# Patient Record
Sex: Male | Born: 1935
Health system: Southern US, Community
[De-identification: ages and names within clinical notes are randomized; demographics above are authoritative.]

## PROBLEM LIST (undated history)

## (undated) DIAGNOSIS — K219 Gastro-esophageal reflux disease without esophagitis: Secondary | ICD-10-CM

## (undated) DIAGNOSIS — K2270 Barrett's esophagus without dysplasia: Secondary | ICD-10-CM

## (undated) DIAGNOSIS — I2510 Atherosclerotic heart disease of native coronary artery without angina pectoris: Secondary | ICD-10-CM

## (undated) DIAGNOSIS — I219 Acute myocardial infarction, unspecified: Secondary | ICD-10-CM

## (undated) DIAGNOSIS — R7309 Other abnormal glucose: Secondary | ICD-10-CM

## (undated) DIAGNOSIS — E785 Hyperlipidemia, unspecified: Secondary | ICD-10-CM

## (undated) DIAGNOSIS — R17 Unspecified jaundice: Secondary | ICD-10-CM

## (undated) DIAGNOSIS — I10 Essential (primary) hypertension: Secondary | ICD-10-CM

## (undated) DIAGNOSIS — J189 Pneumonia, unspecified organism: Secondary | ICD-10-CM

## (undated) DIAGNOSIS — K635 Polyp of colon: Secondary | ICD-10-CM

## (undated) HISTORY — DX: Barrett's esophagus without dysplasia: K22.70

## (undated) HISTORY — DX: Polyp of colon: K63.5

## (undated) HISTORY — DX: Gastro-esophageal reflux disease without esophagitis: K21.9

## (undated) HISTORY — DX: Unspecified jaundice: R17

## (undated) HISTORY — DX: Atherosclerotic heart disease of native coronary artery without angina pectoris: I25.10

## (undated) HISTORY — DX: Acute myocardial infarction, unspecified: I21.9

## (undated) HISTORY — DX: Essential (primary) hypertension: I10

## (undated) HISTORY — DX: Pneumonia, unspecified organism: J18.9

## (undated) HISTORY — DX: Hyperlipidemia, unspecified: E78.5

## (undated) HISTORY — DX: Other abnormal glucose: R73.09

## (undated) HISTORY — PX: OTHER SURGICAL HISTORY: SHX169

---

## 1987-09-09 HISTORY — PX: CORONARY ARTERY BYPASS GRAFT: SHX141

## 1999-09-09 HISTORY — PX: CORONARY ANGIOPLASTY: SHX604

## 2000-03-26 ENCOUNTER — Ambulatory Visit (HOSPITAL_COMMUNITY): Admission: RE | Admit: 2000-03-26 | Discharge: 2000-03-27 | Payer: Self-pay | Admitting: *Deleted

## 2000-03-26 ENCOUNTER — Encounter: Payer: Self-pay | Admitting: *Deleted

## 2001-09-08 HISTORY — PX: CHOLECYSTECTOMY: SHX55

## 2001-12-07 HISTORY — PX: OTHER SURGICAL HISTORY: SHX169

## 2003-04-09 DIAGNOSIS — K635 Polyp of colon: Secondary | ICD-10-CM

## 2003-04-09 HISTORY — DX: Polyp of colon: K63.5

## 2003-05-09 ENCOUNTER — Encounter: Payer: Self-pay | Admitting: Gastroenterology

## 2003-05-09 ENCOUNTER — Encounter: Payer: Self-pay | Admitting: Family Medicine

## 2003-05-09 HISTORY — PX: ESOPHAGOGASTRODUODENOSCOPY: SHX1529

## 2003-05-09 HISTORY — PX: COLONOSCOPY W/ POLYPECTOMY: SHX1380

## 2003-07-10 ENCOUNTER — Encounter: Payer: Self-pay | Admitting: Family Medicine

## 2003-07-10 LAB — CONVERTED CEMR LAB: PSA: 0.36 ng/mL

## 2004-06-08 ENCOUNTER — Encounter: Payer: Self-pay | Admitting: Family Medicine

## 2004-06-08 LAB — CONVERTED CEMR LAB: PSA: 0.4 ng/mL

## 2004-07-29 ENCOUNTER — Ambulatory Visit: Payer: Self-pay | Admitting: Family Medicine

## 2004-08-23 ENCOUNTER — Ambulatory Visit: Payer: Self-pay | Admitting: Family Medicine

## 2004-09-12 ENCOUNTER — Ambulatory Visit: Payer: Self-pay | Admitting: Cardiology

## 2004-09-30 HISTORY — PX: OTHER SURGICAL HISTORY: SHX169

## 2004-10-02 ENCOUNTER — Ambulatory Visit: Payer: Self-pay

## 2004-12-18 ENCOUNTER — Ambulatory Visit: Payer: Self-pay | Admitting: Family Medicine

## 2004-12-25 ENCOUNTER — Ambulatory Visit: Payer: Self-pay | Admitting: Family Medicine

## 2005-01-07 ENCOUNTER — Ambulatory Visit: Payer: Self-pay | Admitting: Family Medicine

## 2005-02-12 ENCOUNTER — Ambulatory Visit: Payer: Self-pay | Admitting: Family Medicine

## 2005-04-08 ENCOUNTER — Ambulatory Visit: Payer: Self-pay | Admitting: Family Medicine

## 2005-04-09 ENCOUNTER — Ambulatory Visit: Payer: Self-pay | Admitting: Family Medicine

## 2005-09-08 ENCOUNTER — Encounter: Payer: Self-pay | Admitting: Family Medicine

## 2005-09-08 LAB — CONVERTED CEMR LAB: PSA: 0.48 ng/mL

## 2005-09-25 ENCOUNTER — Ambulatory Visit: Payer: Self-pay | Admitting: Family Medicine

## 2005-09-30 ENCOUNTER — Ambulatory Visit: Payer: Self-pay | Admitting: Family Medicine

## 2005-10-09 ENCOUNTER — Ambulatory Visit: Payer: Self-pay | Admitting: Family Medicine

## 2005-12-03 ENCOUNTER — Inpatient Hospital Stay (HOSPITAL_COMMUNITY): Admission: EM | Admit: 2005-12-03 | Discharge: 2005-12-06 | Payer: Self-pay | Admitting: Emergency Medicine

## 2005-12-03 ENCOUNTER — Ambulatory Visit: Payer: Self-pay | Admitting: Internal Medicine

## 2005-12-05 HISTORY — PX: CORONARY ANGIOPLASTY WITH STENT PLACEMENT: SHX49

## 2005-12-24 ENCOUNTER — Ambulatory Visit: Payer: Self-pay | Admitting: Cardiology

## 2006-01-29 ENCOUNTER — Ambulatory Visit: Payer: Self-pay | Admitting: Cardiology

## 2006-02-11 ENCOUNTER — Ambulatory Visit: Payer: Self-pay

## 2006-02-11 HISTORY — PX: OTHER SURGICAL HISTORY: SHX169

## 2006-06-23 ENCOUNTER — Ambulatory Visit: Payer: Self-pay | Admitting: Cardiology

## 2006-07-29 ENCOUNTER — Ambulatory Visit: Payer: Self-pay | Admitting: Family Medicine

## 2006-11-20 ENCOUNTER — Ambulatory Visit: Payer: Self-pay | Admitting: Family Medicine

## 2006-11-20 LAB — CONVERTED CEMR LAB
ALT: 26 units/L (ref 0–40)
AST: 22 units/L (ref 0–37)
Albumin: 4.1 g/dL (ref 3.5–5.2)
Alkaline Phosphatase: 54 units/L (ref 39–117)
BUN: 9 mg/dL (ref 6–23)
Basophils Absolute: 0 10*3/uL (ref 0.0–0.1)
Basophils Relative: 0.1 % (ref 0.0–1.0)
Bilirubin, Direct: 0.2 mg/dL (ref 0.0–0.3)
CO2: 30 meq/L (ref 19–32)
Calcium: 9.4 mg/dL (ref 8.4–10.5)
Chloride: 103 meq/L (ref 96–112)
Cholesterol: 119 mg/dL (ref 0–200)
Creatinine, Ser: 0.8 mg/dL (ref 0.4–1.5)
Eosinophils Absolute: 0.1 10*3/uL (ref 0.0–0.6)
Eosinophils Relative: 1.3 % (ref 0.0–5.0)
GFR calc Af Amer: 123 mL/min
GFR calc non Af Amer: 102 mL/min
Glucose, Bld: 104 mg/dL — ABNORMAL HIGH (ref 70–99)
HCT: 46.9 % (ref 39.0–52.0)
HDL: 36.5 mg/dL — ABNORMAL LOW (ref >39.0)
Hemoglobin: 16.1 g/dL (ref 13.0–17.0)
LDL Cholesterol: 67 mg/dL (ref 0–99)
Lymphocytes Relative: 17.1 % (ref 12.0–46.0)
MCHC: 34.3 g/dL (ref 30.0–36.0)
MCV: 90.2 fL (ref 78.0–100.0)
Monocytes Absolute: 0.9 10*3/uL — ABNORMAL HIGH (ref 0.2–0.7)
Monocytes Relative: 12.3 % — ABNORMAL HIGH (ref 3.0–11.0)
Neutro Abs: 5.4 10*3/uL (ref 1.4–7.7)
Neutrophils Relative %: 69.2 % (ref 43.0–77.0)
PSA: 0.4 ng/mL
PSA: 0.4 ng/mL (ref 0.10–4.00)
Platelets: 168 10*3/uL (ref 150–400)
Potassium: 4.4 meq/L (ref 3.5–5.1)
RBC: 5.2 M/uL (ref 4.22–5.81)
RDW: 13.4 % (ref 11.5–14.6)
Sodium: 139 meq/L (ref 135–145)
TSH: 2.42 microintl units/mL (ref 0.35–5.50)
Total Bilirubin: 1.2 mg/dL (ref 0.3–1.2)
Total CHOL/HDL Ratio: 3.3
Total Protein: 6.9 g/dL (ref 6.0–8.3)
Triglycerides: 76 mg/dL (ref 0–149)
VLDL: 15 mg/dL (ref 0–40)
WBC: 7.7 10*3/uL (ref 4.5–10.5)

## 2006-11-25 ENCOUNTER — Ambulatory Visit: Payer: Self-pay | Admitting: Cardiology

## 2006-11-25 ENCOUNTER — Ambulatory Visit: Payer: Self-pay | Admitting: Family Medicine

## 2006-12-16 LAB — FECAL OCCULT BLOOD, GUAIAC: Fecal Occult Blood: NEGATIVE

## 2006-12-21 ENCOUNTER — Ambulatory Visit: Payer: Self-pay | Admitting: Family Medicine

## 2007-05-18 ENCOUNTER — Encounter: Payer: Self-pay | Admitting: Family Medicine

## 2007-05-19 DIAGNOSIS — I10 Essential (primary) hypertension: Secondary | ICD-10-CM | POA: Insufficient documentation

## 2007-05-19 DIAGNOSIS — K219 Gastro-esophageal reflux disease without esophagitis: Secondary | ICD-10-CM | POA: Insufficient documentation

## 2007-05-19 DIAGNOSIS — R7309 Other abnormal glucose: Secondary | ICD-10-CM | POA: Insufficient documentation

## 2007-05-19 DIAGNOSIS — I2510 Atherosclerotic heart disease of native coronary artery without angina pectoris: Secondary | ICD-10-CM | POA: Insufficient documentation

## 2007-05-19 DIAGNOSIS — E7800 Pure hypercholesterolemia, unspecified: Secondary | ICD-10-CM | POA: Insufficient documentation

## 2007-05-28 ENCOUNTER — Ambulatory Visit: Payer: Self-pay | Admitting: Family Medicine

## 2007-05-28 DIAGNOSIS — M25519 Pain in unspecified shoulder: Secondary | ICD-10-CM | POA: Insufficient documentation

## 2007-07-07 ENCOUNTER — Ambulatory Visit: Payer: Self-pay | Admitting: Family Medicine

## 2007-07-07 LAB — CONVERTED CEMR LAB
ALT: 20 units/L (ref 0–53)
AST: 21 units/L (ref 0–37)

## 2007-07-14 ENCOUNTER — Ambulatory Visit: Payer: Self-pay | Admitting: Cardiology

## 2007-07-14 ENCOUNTER — Encounter: Payer: Self-pay | Admitting: Family Medicine

## 2007-08-18 ENCOUNTER — Ambulatory Visit: Payer: Self-pay | Admitting: Family Medicine

## 2007-08-18 LAB — CONVERTED CEMR LAB
ALT: 18 units/L (ref 0–53)
AST: 21 units/L (ref 0–37)
Cholesterol: 126 mg/dL (ref 0–200)
HDL: 30.1 mg/dL — ABNORMAL LOW (ref >39.0)
LDL Cholesterol: 83 mg/dL (ref 0–99)
Total CHOL/HDL Ratio: 4.2
Triglycerides: 67 mg/dL (ref 0–149)
VLDL: 13 mg/dL (ref 0–40)

## 2007-12-29 ENCOUNTER — Ambulatory Visit: Payer: Self-pay | Admitting: Cardiology

## 2008-02-22 ENCOUNTER — Encounter: Payer: Self-pay | Admitting: Family Medicine

## 2008-02-22 ENCOUNTER — Ambulatory Visit: Payer: Self-pay

## 2008-02-22 HISTORY — PX: OTHER SURGICAL HISTORY: SHX169

## 2008-04-05 ENCOUNTER — Ambulatory Visit: Payer: Self-pay | Admitting: Family Medicine

## 2008-05-10 ENCOUNTER — Ambulatory Visit: Payer: Self-pay | Admitting: Family Medicine

## 2008-05-10 DIAGNOSIS — J310 Chronic rhinitis: Secondary | ICD-10-CM | POA: Insufficient documentation

## 2008-05-17 ENCOUNTER — Ambulatory Visit: Payer: Self-pay | Admitting: Family Medicine

## 2008-06-23 HISTORY — PX: NOSE SURGERY: SHX723

## 2008-06-23 HISTORY — PX: NASAL SINUS SURGERY: SHX719

## 2008-06-28 ENCOUNTER — Ambulatory Visit: Payer: Self-pay | Admitting: Cardiology

## 2008-08-09 ENCOUNTER — Ambulatory Visit: Payer: Medicare Other | Admitting: Otolaryngology

## 2008-08-17 ENCOUNTER — Ambulatory Visit: Payer: Medicare Other | Admitting: Otolaryngology

## 2008-08-20 ENCOUNTER — Emergency Department: Payer: Medicare Other | Admitting: Emergency Medicine

## 2008-08-30 ENCOUNTER — Ambulatory Visit: Payer: Self-pay | Admitting: Family Medicine

## 2008-08-30 LAB — CONVERTED CEMR LAB
ALT: 32 units/L (ref 0–53)
AST: 24 units/L (ref 0–37)
Albumin: 3.5 g/dL (ref 3.5–5.2)
Alkaline Phosphatase: 40 units/L (ref 39–117)
BUN: 12 mg/dL (ref 6–23)
Basophils Absolute: 0 10*3/uL (ref 0.0–0.1)
Basophils Relative: 0.1 % (ref 0.0–3.0)
Bilirubin, Direct: 0.1 mg/dL (ref 0.0–0.3)
CO2: 27 meq/L (ref 19–32)
Calcium: 9 mg/dL (ref 8.4–10.5)
Chloride: 108 meq/L (ref 96–112)
Cholesterol: 120 mg/dL (ref 0–200)
Creatinine, Ser: 0.7 mg/dL (ref 0.4–1.5)
Creatinine,U: 97.6 mg/dL
Eosinophils Absolute: 0.3 10*3/uL (ref 0.0–0.7)
Eosinophils Relative: 2.8 % (ref 0.0–5.0)
GFR calc Af Amer: 143 mL/min
GFR calc non Af Amer: 118 mL/min
Glucose, Bld: 110 mg/dL — ABNORMAL HIGH (ref 70–99)
HCT: 43 % (ref 39.0–52.0)
HDL: 30.6 mg/dL — ABNORMAL LOW (ref >39.0)
Hemoglobin: 14.7 g/dL (ref 13.0–17.0)
LDL Cholesterol: 73 mg/dL (ref 0–99)
Lymphocytes Relative: 15.7 % (ref 12.0–46.0)
MCHC: 34.2 g/dL (ref 30.0–36.0)
MCV: 94.2 fL (ref 78.0–100.0)
Microalb Creat Ratio: 2 mg/g (ref 0.0–30.0)
Microalb, Ur: 0.2 mg/dL (ref 0.0–1.9)
Monocytes Absolute: 0.8 10*3/uL (ref 0.1–1.0)
Monocytes Relative: 8.8 % (ref 3.0–12.0)
Neutro Abs: 6.7 10*3/uL (ref 1.4–7.7)
Neutrophils Relative %: 72.6 % (ref 43.0–77.0)
PSA: 0.66 ng/mL (ref 0.10–4.00)
Platelets: 168 10*3/uL (ref 150–400)
Potassium: 4.4 meq/L (ref 3.5–5.1)
RBC: 4.56 M/uL (ref 4.22–5.81)
RDW: 12.7 % (ref 11.5–14.6)
Sodium: 140 meq/L (ref 135–145)
TSH: 1.61 microintl units/mL (ref 0.35–5.50)
Total Bilirubin: 0.5 mg/dL (ref 0.3–1.2)
Total CHOL/HDL Ratio: 3.9
Total Protein: 6.1 g/dL (ref 6.0–8.3)
Triglycerides: 83 mg/dL (ref 0–149)
VLDL: 17 mg/dL (ref 0–40)
WBC: 9.2 10*3/uL (ref 4.5–10.5)

## 2008-09-06 ENCOUNTER — Ambulatory Visit: Payer: Self-pay | Admitting: Family Medicine

## 2008-09-26 ENCOUNTER — Telehealth: Payer: Self-pay | Admitting: Family Medicine

## 2008-11-01 ENCOUNTER — Ambulatory Visit: Payer: Self-pay | Admitting: Gastroenterology

## 2008-11-01 DIAGNOSIS — Z86010 Personal history of colonic polyps: Secondary | ICD-10-CM | POA: Insufficient documentation

## 2008-11-01 DIAGNOSIS — R109 Unspecified abdominal pain: Secondary | ICD-10-CM | POA: Insufficient documentation

## 2008-11-01 DIAGNOSIS — K2270 Barrett's esophagus without dysplasia: Secondary | ICD-10-CM | POA: Insufficient documentation

## 2008-11-15 ENCOUNTER — Encounter: Payer: Self-pay | Admitting: Gastroenterology

## 2008-11-15 ENCOUNTER — Ambulatory Visit: Payer: Self-pay | Admitting: Gastroenterology

## 2008-11-15 HISTORY — PX: COLONOSCOPY: SHX174

## 2008-11-15 LAB — HM COLONOSCOPY

## 2008-11-17 ENCOUNTER — Encounter: Payer: Self-pay | Admitting: Gastroenterology

## 2008-11-17 HISTORY — PX: ESOPHAGOGASTRODUODENOSCOPY: SHX1529

## 2009-04-23 ENCOUNTER — Encounter: Payer: Self-pay | Admitting: Family Medicine

## 2009-04-24 ENCOUNTER — Ambulatory Visit: Payer: Self-pay | Admitting: Family Medicine

## 2009-05-16 ENCOUNTER — Ambulatory Visit: Payer: Self-pay | Admitting: Family Medicine

## 2009-05-16 LAB — CONVERTED CEMR LAB
BUN: 12 mg/dL (ref 6–23)
CO2: 24 meq/L (ref 19–32)
Calcium: 9.1 mg/dL (ref 8.4–10.5)
Chloride: 106 meq/L (ref 96–112)
Creatinine, Ser: 0.8 mg/dL (ref 0.4–1.5)
GFR calc non Af Amer: 100.73 mL/min (ref >60)
Glucose, Bld: 111 mg/dL — ABNORMAL HIGH (ref 70–99)
Potassium: 4.2 meq/L (ref 3.5–5.1)
Sodium: 136 meq/L (ref 135–145)

## 2009-06-06 ENCOUNTER — Ambulatory Visit: Payer: Self-pay | Admitting: Family Medicine

## 2009-06-20 ENCOUNTER — Ambulatory Visit: Payer: Self-pay | Admitting: Cardiology

## 2009-10-29 ENCOUNTER — Ambulatory Visit: Payer: Self-pay | Admitting: Family Medicine

## 2009-10-29 LAB — CONVERTED CEMR LAB
ALT: 21 units/L (ref 0–53)
AST: 21 units/L (ref 0–37)
Albumin: 4.1 g/dL (ref 3.5–5.2)
Alkaline Phosphatase: 51 units/L (ref 39–117)
BUN: 12 mg/dL (ref 6–23)
Basophils Absolute: 0 10*3/uL (ref 0.0–0.1)
Basophils Relative: 0.2 % (ref 0.0–3.0)
Bilirubin, Direct: 0.2 mg/dL (ref 0.0–0.3)
CO2: 29 meq/L (ref 19–32)
Calcium: 9.1 mg/dL (ref 8.4–10.5)
Chloride: 107 meq/L (ref 96–112)
Cholesterol: 126 mg/dL (ref 0–200)
Creatinine, Ser: 0.9 mg/dL (ref 0.4–1.5)
Creatinine,U: 72.5 mg/dL
Eosinophils Absolute: 0.2 10*3/uL (ref 0.0–0.7)
Eosinophils Relative: 3.1 % (ref 0.0–5.0)
GFR calc non Af Amer: 87.82 mL/min (ref >60)
Glucose, Bld: 105 mg/dL — ABNORMAL HIGH (ref 70–99)
HCT: 45.3 % (ref 39.0–52.0)
HDL: 39.4 mg/dL (ref >39.00)
Hemoglobin: 15.1 g/dL (ref 13.0–17.0)
LDL Cholesterol: 69 mg/dL (ref 0–99)
Lymphocytes Relative: 20.6 % (ref 12.0–46.0)
Lymphs Abs: 1.2 10*3/uL (ref 0.7–4.0)
MCHC: 33.3 g/dL (ref 30.0–36.0)
MCV: 95.8 fL (ref 78.0–100.0)
Microalb Creat Ratio: 4.1 mg/g (ref 0.0–30.0)
Microalb, Ur: 0.3 mg/dL (ref 0.0–1.9)
Monocytes Absolute: 0.6 10*3/uL (ref 0.1–1.0)
Monocytes Relative: 11.4 % (ref 3.0–12.0)
Neutro Abs: 3.7 10*3/uL (ref 1.4–7.7)
Neutrophils Relative %: 64.7 % (ref 43.0–77.0)
PSA: 0.49 ng/mL (ref 0.10–4.00)
Platelets: 118 10*3/uL — ABNORMAL LOW (ref 150.0–400.0)
Potassium: 4.4 meq/L (ref 3.5–5.1)
RBC: 4.73 M/uL (ref 4.22–5.81)
RDW: 12.7 % (ref 11.5–14.6)
Sodium: 139 meq/L (ref 135–145)
TSH: 2.85 microintl units/mL (ref 0.35–5.50)
Total Bilirubin: 1 mg/dL (ref 0.3–1.2)
Total CHOL/HDL Ratio: 3
Total Protein: 6.7 g/dL (ref 6.0–8.3)
Triglycerides: 88 mg/dL (ref 0.0–149.0)
VLDL: 17.6 mg/dL (ref 0.0–40.0)
WBC: 5.7 10*3/uL (ref 4.5–10.5)

## 2009-10-30 LAB — CONVERTED CEMR LAB: Vit D, 25-Hydroxy: 40 ng/mL (ref 30–89)

## 2009-11-01 ENCOUNTER — Ambulatory Visit: Payer: Self-pay | Admitting: Family Medicine

## 2009-11-01 DIAGNOSIS — L290 Pruritus ani: Secondary | ICD-10-CM | POA: Insufficient documentation

## 2009-11-01 DIAGNOSIS — K649 Unspecified hemorrhoids: Secondary | ICD-10-CM | POA: Insufficient documentation

## 2009-11-01 DIAGNOSIS — I252 Old myocardial infarction: Secondary | ICD-10-CM | POA: Insufficient documentation

## 2010-02-20 ENCOUNTER — Ambulatory Visit: Payer: Self-pay | Admitting: Cardiovascular Disease

## 2010-03-20 ENCOUNTER — Ambulatory Visit: Payer: Self-pay | Admitting: Cardiovascular Disease

## 2010-03-27 LAB — CONVERTED CEMR LAB
ALT: 19 units/L (ref 0–53)
AST: 23 units/L (ref 0–37)
Albumin: 4.2 g/dL (ref 3.5–5.2)
Alkaline Phosphatase: 58 units/L (ref 39–117)
Bilirubin, Direct: 0.2 mg/dL (ref 0.0–0.3)
Cholesterol: 110 mg/dL (ref 0–200)
HDL: 38 mg/dL — ABNORMAL LOW (ref >39)
Indirect Bilirubin: 0.5 mg/dL (ref 0.0–0.9)
LDL Cholesterol: 58 mg/dL (ref 0–99)
Total Bilirubin: 0.7 mg/dL (ref 0.3–1.2)
Total CHOL/HDL Ratio: 2.9
Total Protein: 6.3 g/dL (ref 6.0–8.3)
Triglycerides: 72 mg/dL (ref <150)
VLDL: 14 mg/dL (ref 0–40)

## 2010-04-10 ENCOUNTER — Encounter (INDEPENDENT_AMBULATORY_CARE_PROVIDER_SITE_OTHER): Payer: Self-pay | Admitting: *Deleted

## 2010-07-29 ENCOUNTER — Ambulatory Visit: Payer: Self-pay | Admitting: Family Medicine

## 2010-08-06 ENCOUNTER — Telehealth: Payer: Self-pay | Admitting: Family Medicine

## 2010-08-12 ENCOUNTER — Encounter: Payer: Self-pay | Admitting: Family Medicine

## 2010-08-12 LAB — CONVERTED CEMR LAB
Bilirubin Urine: NEGATIVE
Blood in Urine, dipstick: NEGATIVE
Glucose, Urine, Semiquant: NEGATIVE
Ketones, urine, test strip: NEGATIVE
Nitrite: NEGATIVE
Protein, U semiquant: NEGATIVE
Specific Gravity, Urine: 1.015
Urobilinogen, UA: 0.2
WBC Urine, dipstick: NEGATIVE
pH: 6

## 2010-10-08 NOTE — Progress Notes (Signed)
Summary: DOT paperwork  Phone Note Call from Patient Call back at Home Phone [phone]   Caller: Patient Call For: Crawford Givens MD Summary of Call: Patient dropped off DOT paperwork to be completed.  He had has PE on 07/29/10.  His Ophthalmologist has attached a note for his vision but a hearing test and a urinalysis usually has to be done as well.  These forms need to be presented at the time of the exam so that all of the required information can be secured.  Form in your in box. Initial call taken by: Delilah Shan CMA Duncan Dull),  August 06, 2010 4:37 PM  Follow-up for Phone Call        From is done except for ua.  Pt was conversational >5 feet at exam.  Have him come by to pick up the form and do the UA at that point.  If ua wnl, please fill out the ua section, copy the form, give him the original, and scan the copy.  If ua has abnormal sp gr/blood/sugar/protein, notify me and hold the form.   Follow-up by: Crawford Givens MD,  August 07, 2010 12:18 PM  Additional Follow-up for Phone Call Additional follow up Details #1::        Wife advised. Additional Follow-up by: Delilah Shan CMA Duncan Dull),  August 07, 2010 4:57 PM     Appended Document: DOT paperwork U/A done and entered into EMR and on form.  Form copied and scanned.  Patient received the paperwork.

## 2010-10-08 NOTE — Assessment & Plan Note (Signed)
Summary: 6 WK F/U  DLO   Vital Signs:  Patient Profile:   77 Years Old Male Weight:      188 pounds Temp:     97.5 degrees F oral Pulse rate:   60 / minute Pulse rhythm:   regular BP sitting:   140 / 70  (right arm) Cuff size:   regular  Vitals Entered By: Providence Crosby (May 10, 2008 10:05 AM)                 Chief Complaint:  6 week followup// c/o face pain and pressure // headache and nose running.  History of Present Illness: Here for followup of HTN.  Still nasal drip daily, occasionally drips clear out of nose. Has h/a mostly perinasally and right temporal. Doesn't think he needs meds for the rhinitis but would like to get rid of the H/A, which started abouit the time of the rhinitis. Sys BP yest high but then relates yest had 2-3 soft drinks....probably the caffeine.    Prior Medications Reviewed Using: Patient Recall  Current Allergies (reviewed today): LIPITOR MORPHINE * CRESTOR ZOCOR      Physical Exam  General:     Well-developed,well-nourished,in no acute distress; alert,appropriate and cooperative throughout examination Head:     Normocephalic and atraumatic without obvious abnormalities. No apparent alopecia or balding. Eyes:     Conjunctiva clear bilaterally.  Ears:     External ear exam shows no significant lesions or deformities.  Otoscopic examination reveals clear canals, tympanic membranes are intact bilaterally without bulging, retraction, inflammation or discharge. Hearing is grossly normal bilaterally. Nose:     External nasal examination shows no deformity or inflammation. Nasal mucosa are pink and moist without lesions or exudates. Mild clear discharge. Mouth:     Oral mucosa and oropharynx without lesions or exudates.  Teeth in good repair. Neck:     No deformities, masses, or tenderness noted. Lungs:     Normal respiratory effort, chest expands symmetrically. Lungs are clear to auscultation, no crackles or wheezes. Heart:  Normal rate and regular rhythm. S1 and S2 normal without gallop, murmur, click, rub or other extra sounds.    Impression & Recommendations:  Problem # 1:  HYPERTENSION (ICD-401.9) Assessment: Improved Will follow and wait until h/a gone to medicate further. Avoid casffeine andf salt as much as possible. The following medications were removed from the medication list:    Lisinopril 20 Mg Tabs (Lisinopril) .Marland Kitchen... 1 daily by mouth  His updated medication list for this problem includes:    Metoprolol Succinate 50 Mg Tb24 (Metoprolol succinate) ..... One half by mouth two times a day  BP today: 140/70 Prior BP: 150/80 (04/05/2008)  Labs Reviewed: Creat: 0.8 (11/20/2006) Chol: 126 (08/18/2007)   HDL: 30.1 (08/18/2007)   LDL: 83 (08/18/2007)   TG: 67 (08/18/2007)   Problem # 2:  CHRONIC RHINITIS (ICD-472.0) Assessment: Unchanged Will avoid medicating at this point.  Problem # 3:  HEADACHE (ICD-784.0) Assessment: New Take Aleeve 2 after brfst and supper, as needed. Will try Prednisone for inflammation. The following medications were removed from the medication list:    Adult Aspirin Low Strength 81 Mg Tbdp (Aspirin) ..... One by mouth qd  His updated medication list for this problem includes:    Metoprolol Succinate 50 Mg Tb24 (Metoprolol succinate) ..... One half by mouth two times a day    Bayer Aspirin 325 Mg Tabs (Aspirin) .Marland Kitchen... 1 daily by mouth   Complete Medication List: 1)  Metoprolol Succinate 50 Mg Tb24 (Metoprolol succinate) .... One half by mouth two times a day 2)  Nitrostat 0.4 Mg Subl (Nitroglycerin) .... Take by mouth as directed prn 3)  Simvastatin 80 Mg Tabs (Simvastatin) .Marland Kitchen.. 1 tab by mouth at night. 4)  Bayer Aspirin 325 Mg Tabs (Aspirin) .Marland Kitchen.. 1 daily by mouth 5)  Mucinex 600 Mg Xr12h-tab (Guaifenesin) .Marland Kitchen.. 1 as needed without help 6)  Prednisone 50 Mg Tabs (Prednisone) .... One tab by mouth once daily   Patient Instructions: 1)  RTC one week. 2)  Check BP  and h/a.   Prescriptions: PREDNISONE 50 MG TABS (PREDNISONE) one tab by mouth once daily  #5 x 0   Entered and Authorized by:   Shaune Leeks MD   Signed by:   Shaune Leeks MD on 05/10/2008   Method used:   Electronically to        [facility]       [address]       [place], [place]  [address]       Ph: [phone]       Fax: [phone]   RxID:   [id number]  ]

## 2010-10-08 NOTE — Procedures (Signed)
Summary: Colonoscopy  Colonoscopy   Imported By: Sherian Rein 11/10/2008 11:58:32  _____________________________________________________________________  External Attachment:    Type:   Image     Comment:   External Document

## 2010-10-08 NOTE — Assessment & Plan Note (Signed)
Summary: 1 WK F/U  DLO   Vital Signs:  Patient Profile:   77 Years Old Male Weight:      190 pounds Temp:     98.1 degrees F oral Pulse rate:   64 / minute Pulse rhythm:   regular BP sitting:   120 / 68  (right arm) Cuff size:   regular  Vitals Entered By: Providence Crosby (May 17, 2008 10:13 AM)                 Chief Complaint:  followup // states i am 60% better still has pressure bedhind eyes and running nose at times // headache gone.    Prior Medications Reviewed Using: Patient Recall  Current Allergies (reviewed today): LIPITOR MORPHINE * CRESTOR ZOCOR        Complete Medication List: 1)  Metoprolol Succinate 50 Mg Tb24 (Metoprolol succinate) .... One half by mouth two times a day 2)  Nitrostat 0.4 Mg Subl (Nitroglycerin) .... Take by mouth as directed prn 3)  Simvastatin 80 Mg Tabs (Simvastatin) .Marland Kitchen.. 1 tab by mouth at night. 4)  Bayer Aspirin 325 Mg Tabs (Aspirin) .Marland Kitchen.. 1 daily by mouth 5)  Mucinex 600 Mg Xr12h-tab (Guaifenesin) .Marland Kitchen.. 1 as needed without help    ]  Appended Document: 1 WK F/U  DLO      History of Present Illness: Pt here to followup BP and headache. Headache is resolved, ears are congested and has mild congestion. Prednisone has helped his sense of facial fullness...he has been off two days. He still has mild pressure in the sphenoid area with off/on congestion of the left ear sounding like in a tunnel. He daenies fever or chills, cough resolved and headache is  gone.    Current Allergies: LIPITOR MORPHINE * CRESTOR ZOCOR      Physical Exam  General:     Well-developed,well-nourished,in no acute distress; alert,appropriate and cooperative throughout examination Head:     Normocephalic and atraumatic without obvious abnormalities. No apparent alopecia or balding. Sinuses nontender. Eyes:     Conjunctiva clear bilaterally.  Ears:     External ear exam shows no significant lesions or deformities.  Otoscopic examination  reveals clear canals, tympanic membranes are intact bilaterally without bulging, retraction, inflammation or discharge. Hearing is grossly normal bilaterally. R TM mildly dull to LR but mobile. Nose:     External nasal examination shows no deformity or inflammation. Nasal mucosa are pink and moist without lesions or exudates. Mouth:     Oral mucosa and oropharynx without lesions or exudates.  Teeth in good repair. Neck:     No deformities, masses, or tenderness noted. Lungs:     Normal respiratory effort, chest expands symmetrically. Lungs are clear to auscultation, no crackles or wheezes. Heart:     Normal rate and regular rhythm. S1 and S2 normal without gallop, murmur, click, rub or other extra sounds.    Impression & Recommendations:  Problem # 1:  HEADACHE (ICD-784.0) Assessment: Improved Resolved...still left with mild congestion. His updated medication list for this problem includes:    Metoprolol Succinate 50 Mg Tb24 (Metoprolol succinate) ..... One half by mouth two times a day    Bayer Aspirin 325 Mg Tabs (Aspirin) .Marland Kitchen... 1 daily by mouth   Problem # 2:  CHRONIC RHINITIS (ICD-472.0) Assessment: Improved Cont Mucous Relief for another week or so to cont tohelp drain sinuses, gargle with warm salt water for ear congestion with ETD dysfctn.  Problem # 3:  HYPERTENSION (  ICD-401.9) Assessment: Improved No need for change in meds. Will recheck in Dec. His updated medication list for this problem includes:    Metoprolol Succinate 50 Mg Tb24 (Metoprolol succinate) ..... One half by mouth two times a day  Prior BP: 120/68 (05/17/2008)  Labs Reviewed: Creat: 0.8 (11/20/2006) Chol: 126 (08/18/2007)   HDL: 30.1 (08/18/2007)   LDL: 83 (08/18/2007)   TG: 67 (08/18/2007)   Complete Medication List: 1)  Metoprolol Succinate 50 Mg Tb24 (Metoprolol succinate) .... One half by mouth two times a day 2)  Nitrostat 0.4 Mg Subl (Nitroglycerin) .... Take by mouth as directed prn 3)   Simvastatin 80 Mg Tabs (Simvastatin) .Marland Kitchen.. 1 tab by mouth at night. 4)  Bayer Aspirin 325 Mg Tabs (Aspirin) .Marland Kitchen.. 1 daily by mouth 5)  Mucinex 600 Mg Xr12h-tab (Guaifenesin) .Marland Kitchen.. 1 as needed without help   Patient Instructions: 1)  Keep appt in Dec.   ]

## 2010-10-08 NOTE — Assessment & Plan Note (Signed)
Summary: ROV/AMD  Medications Added METOPROLOL SUCCINATE 50 MG  TB24 (METOPROLOL SUCCINATE) 1/2 by mouth two times a day METOPROLOL TARTRATE 50 MG TABS (METOPROLOL TARTRATE) Take 1/2 tablet by mouth twice a day      Allergies Added:   Visit Type:  ROV Primary Provider:  Laurita Quint, MD  CC:  CP; ?REFLUX; COMES AND GOES; LOT OF BURPING; NO SOB; NO EDEMA.  History of Present Illness: [name] disease today for followup of his coronary artery disease. He is having no symptoms of angina or ischemic equivalence.  Isn't a lot of stress at work and is just a bundle of nerves today in the office. He has multiple psychosomatic complaints including fatigue, hard heartbeats, and left scapular pain off and on but sharp in nature.  He is compliant with his medications. His blood pressure nothing down and primary care is managing at this time.  His last stress nuclear study was in June of 2009.farct EF was normal with an old inferior wall infarct scar. There was no ischemia.  Current Medications (verified): 1)  Metoprolol Tartrate 50 Mg Tabs (Metoprolol Tartrate) .... Take 1/2 Tablet By Mouth Twice A Day 2)  Nitrostat 0.4 Mg  Subl (Nitroglycerin) .... Take By Mouth As Directed As Needed 3)  Simvastatin 80 Mg  Tabs (Simvastatin) .Marland Kitchen.. 1 Tab By Mouth At Night. 4)  Adult Aspirin Low Strength 81 Mg Tbdp (Aspirin) .... One A Day By Mouth 5)  Ranitidine Hcl 150 Mg Caps (Ranitidine Hcl) .... One Tab By Mouth Prior To Brfst and Supper. 6)  Multivitamins  Tabs (Multiple Vitamin) .... Once Daily By Mouth 7)  B-12 1000 Mcg Cr-Tabs (Cyanocobalamin) .... Once Daily By Mouth 8)  Vitamin D 1000 Unit Tabs (Cholecalciferol) .... Once Daily By Mouth 9)  Calcium 500/d 500-200 Mg-Unit Tabs (Calcium Carbonate-Vitamin D) .... Once Daily By Mouth 10)  Lisinopril 5 Mg Tabs (Lisinopril) .... One Tab By Mouth At Night  Allergies (verified): 1)  ! * Amoxicillin 2)  Lipitor 3)  Morphine 4)  * Crestor 5)   Zocor  Past History:  Past Medical History: Last updated: 11/01/2008 Coronary artery disease GERD Barrett's esophagus Colon polyps, type unknown, 04/2003 Hypertension Elevated glucose Elevated bilirubin Hyperlipidemia  Past Surgical History: Last updated: 11/20/2008 CABG ([facility] ) 1989 PTCA x2 (Dr Pulsipher) 2001 Choleycystectomy 09/2001 Colonoscopy, polypectomy x multiple (Dr Mechele Collin) (05/09/2003) EGD- Barrett's (05/09/2003) Cardiolite- h/o vessel infarct EF 66% (12/2001) Stress myoview- normal EF 70% (09/30/2004) Coronary artery bypass graft PTCA/stent (12/05/2005[facility] (03/28-03/31/2007) Stress myoview- no change, c/w 09/2004 (02/11/2006) Stress Myoview Abnml w/prior inf infarct No signif ischemia (02/22/2008) Nasal Surgery (Dr Chestine Spore) 06/23/2008 Colonoscopy Mild Divertics (Dr Russella Dar) 11/15/08           5 yrs. EGD Barrett's Esoph Erosive Esoph HH (Dr Russella Dar) 11/17/08    2 yrs.  Family History: Last updated: 06/14/2007 Father: died age 40 of heart problems Mother: died age 59- pneumonia Siblings: none  Social History: Last updated: 11/01/2008 Marital Status: Married Children: 2 Occupation: retired Engineer, structural Alcohol Use - yes -2 Daily Caffeine Use -1 Illicit Drug Use - no  Risk Factors: Alcohol Use: 1 (09/06/2008) Caffeine Use: 0 (09/06/2008) Exercise: no (09/06/2008)  Risk Factors: Smoking Status: never (2007/06/14) Passive Smoke Exposure: no (09/06/2008)  Review of Systems       negative other than history of present illness  Vital Signs:  Patient profile:   77 year old male Height:  67 inches Weight:      187.25 pounds BMI:     29.43 Pulse rate:   67 / minute Pulse rhythm:   regular BP sitting:   157 / 84  (left arm) Cuff size:   large  Vitals Entered By: Mercer Pod (June 20, 2009 11:50 AM)  Physical Exam  General:  normal appearance.   Head:  normocephalic and atraumatic Eyes:  PERRLA/EOM intact;  conjunctiva and lids normal. Mouth:  Teeth, gums and palate normal. Oral mucosa normal. Neck:  Neck supple, no JVD. No masses, thyromegaly or abnormal cervical nodes. Lungs:  Clear bilaterally to auscultation and percussion. Heart:  Non-displaced PMI, chest non-tender; regular rate and rhythm, S1, S2 without murmurs, rubs or gallops. Carotid upstroke normal, no bruit. Normal abdominal aortic size, no bruits. Femorals normal pulses, no bruits. Pedals normal pulses. No edema, no varicosities. Msk:  Back normal, normal gait. Muscle strength and tone normal. Pulses:  pulses normal in all 4 extremities Extremities:  No clubbing or cyanosis. Neurologic:  Alert and oriented x 3. Skin:  Intact without lesions or rashes. Psych:  anxious and easily distracted.     EKG  Procedure date:  06/20/2009  Findings:      normal sinus rhythm, left axis deviation, old inferior wall infarct  Impression & Recommendations:  Problem # 1:  CORONARY ARTERY DISEASE (ICD-414.00) Assessment Unchanged  His updated medication list for this problem includes:    Metoprolol Tartrate 50 Mg Tabs (Metoprolol tartrate) .Marland Kitchen... Take 1/2 tablet by mouth twice a day    Nitrostat 0.4 Mg Subl (Nitroglycerin) .Marland Kitchen... Take by mouth as directed as needed    Adult Aspirin Low Strength 81 Mg Tbdp (Aspirin) ..... One a day by mouth    Lisinopril 5 Mg Tabs (Lisinopril) ..... One tab by mouth at night  Problem # 2:  OLD MYOCARDIAL INFARCTION (ICD-412) Assessment: Unchanged  His updated medication list for this problem includes:    Metoprolol Tartrate 50 Mg Tabs (Metoprolol tartrate) .Marland Kitchen... Take 1/2 tablet by mouth twice a day    Nitrostat 0.4 Mg Subl (Nitroglycerin) .Marland Kitchen... Take by mouth as directed as needed    Adult Aspirin Low Strength 81 Mg Tbdp (Aspirin) ..... One a day by mouth    Lisinopril 5 Mg Tabs (Lisinopril) ..... One tab by mouth at night  His updated medication list for this problem includes:    Metoprolol Tartrate 50  Mg Tabs (Metoprolol tartrate) .Marland Kitchen... Take 1/2 tablet by mouth twice a day    Nitrostat 0.4 Mg Subl (Nitroglycerin) .Marland Kitchen... Take by mouth as directed as needed    Adult Aspirin Low Strength 81 Mg Tbdp (Aspirin) ..... One a day by mouth    Lisinopril 5 Mg Tabs (Lisinopril) ..... One tab by mouth at night  Patient Instructions: 1)  Your physician recommends that you schedule a follow-up appointment in: JUNE 2011 2)  Your physician recommends that you continue on your current medications as directed. Please refer to the Current Medication list given to you today. Prescriptions: METOPROLOL TARTRATE 50 MG TABS (METOPROLOL TARTRATE) Take 1/2 tablet by mouth twice a day  #30 x 3   Entered by:   Mercer Pod   Authorized by:   Gaylord Shih, MD, [facility]   Signed by:   Mercer Pod on 06/20/2009   Method used:   Electronically to        [facility]       [address]  [place], [place]  [address]       Ph: [phone]       Fax: [phone]   RxID:   [id number]

## 2010-10-08 NOTE — Letter (Signed)
Summary: Building surveyor   Imported By: Lester Kanab 08/15/2010 13:19:52  _____________________________________________________________________  External Attachment:    Type:   Image     Comment:   External Document

## 2010-10-08 NOTE — Assessment & Plan Note (Signed)
Summary: FOLLOW UP/EVE   Vital Signs:  Patient Profile:   77 Years Old Male Weight:      187 pounds Temp:     98.4 degrees F oral Pulse rate:   52 / minute Pulse rhythm:   regular BP sitting:   130 / 80  (right arm) Cuff size:   regular  Vitals Entered ByMarland Kitchen Providence Crosby (May 28, 2007 11:34 AM)                 Chief Complaint:  6 month f/u.  History of Present Illness: Having problems with right shoulder, aches enough to wake him up in middle of night..began 5-6wks ago. has taken Alleve a little. has been lifting cinder blocks and doing heavy lifting while this is going on and not understanding why his shoulder hurts, "but it doesn't hurt during the day." Having lots of burping lately...was on prilosec in past which he stopped...doesn't seem to have relation to eating.  Eats lots of [place] and [place] foods. Wants to switch Vytorin to generic. Is on 10/40, will go to 80.  Current Allergies (reviewed today): LIPITOR MORPHINE * CRESTOR ZOCOR      Physical Exam  General:     Well-developed,well-nourished,in no acute distress; alert,appropriate and cooperative throughout examination Head:     Normocephalic and atraumatic without obvious abnormalities. No apparent alopecia or balding. Eyes:     Conjunctiva clear bilaterally.  Ears:     External ear exam shows no significant lesions or deformities.  Otoscopic examination reveals clear canals, tympanic membranes are intact bilaterally without bulging, retraction, inflammation or discharge. Hearing is grossly normal bilaterally. Nose:     External nasal examination shows no deformity or inflammation. Nasal mucosa are pink and moist without lesions or exudates. Mouth:     Oral mucosa and oropharynx without lesions or exudates.  Teeth in good repair. Neck:     No deformities, masses, or tenderness noted. Chest Wall:     No deformities, masses, tenderness or gynecomastia noted. Lungs:     Normal respiratory  effort, chest expands symmetrically. Lungs are clear to auscultation, no crackles or wheezes. Heart:     Normal rate and regular rhythm. S1 and S2 normal without gallop, murmur, click, rub or other extra sounds. Msk:     R shoulder nontender to palpation, area of discomfort is trapezial, subscapularis region.    Impression & Recommendations:  Problem # 1:  Hx of HYPERCHOLESTEROLEMIA (ICD-272.0) Assessment: Unchanged Change Vytorin 10/40 to Simvastatin 80 and do labs. His updated medication list for this problem includes:    Vytorin 10-40 Mg Tabs (Ezetimibe-simvastatin) ..... One by mouth qd    Simvastatin 80 Mg Tabs (Simvastatin) .Marland Kitchen... 1 tab by mouth at night.  Labs Reviewed: Chol: 119 (11/20/2006)   HDL: 36.5 (11/20/2006)   LDL: 67 (11/20/2006)   TG: 76 (11/20/2006) SGOT: 22 (11/20/2006)   SGPT: 26 (11/20/2006)   Problem # 2:  HYPERTENSION (ICD-401.9)  His updated medication list for this problem includes:    Metoprolol Succinate 50 Mg Tb24 (Metoprolol succinate) ..... One half by mouth q d    Atacand 16 Mg Tabs (Candesartan cilexetil) ..... One by mouth qd  BP today: 130/80  Labs Reviewed: Creat: 0.8 (11/20/2006) Chol: 119 (11/20/2006)   HDL: 36.5 (11/20/2006)   LDL: 67 (11/20/2006)   TG: 76 (11/20/2006)   Problem # 3:  SHOULDER PAIN, RIGHT (ICD-719.41) Assessment: New Trial of Alleeve 2 tabs after brfst and supper, then stop.Marland Kitchenavoid inciting activity for  the one mionth of medicine. Use heat first thing in AM and then couple times during day, ice at night. His updated medication list for this problem includes:    Adult Aspirin Low Strength 81 Mg Tbdp (Aspirin) ..... One by mouth qd   Problem # 4:  GERD (ICD-530.81) Assessment: Unchanged Having gas pain with burping...try Mylanta whren has the sxs....avoid gassy food, should be eating more fresh fruits and vegetables anyway. Diagnostics Reviewed:  Discussed lifestyle modifications, diet, antacids/medications, and  preventive measures. Handout provided.   Complete Medication List: 1)  Metoprolol Succinate 50 Mg Tb24 (Metoprolol succinate) .... One half by mouth q d 2)  Adult Aspirin Low Strength 81 Mg Tbdp (Aspirin) .... One by mouth qd 3)  Vytorin 10-40 Mg Tabs (Ezetimibe-simvastatin) .... One by mouth qd 4)  Atacand 16 Mg Tabs (Candesartan cilexetil) .... One by mouth qd 5)  Nitrostat 0.4 Mg Subl (Nitroglycerin) .... Take by mouth as directed prn 6)  Simvastatin 80 Mg Tabs (Simvastatin) .Marland Kitchen.. 1 tab by mouth at night.   Patient Instructions: 1)  RTC 3 mos for Chol profile , SGOT, SGPT 272.0 2)  RTC thereafter at 3 mos. 3)  SGOT, SGPT 6 wks.    Prescriptions: SIMVASTATIN 80 MG  TABS (SIMVASTATIN) 1 tab by mouth at night.  #90 x 4   Entered and Authorized by:   Shaune Leeks MD   Signed by:   Shaune Leeks MD on 05/28/2007   Method used:   Print then Give to Patient   RxID:   [id number]  ]

## 2010-10-08 NOTE — Assessment & Plan Note (Signed)
Summary: CPX/DLO   Vital Signs:  Patient Profile:   77 Years Old Male Height:     67 inches Weight:      188 pounds Temp:     97.9 degrees F oral Pulse rate:   56 / minute Pulse rhythm:   regular BP sitting:   140 / 80  (left arm) Cuff size:   regular  Vitals Entered ByMarland Kitchen Providence Crosby (September 06, 2008 2:59 PM)                 Chief Complaint:  CHECK UP// ??COLONOSCOPY.  History of Present Illness: Pt here for Comp Exam.  He just finished with nasal surgery and was given AMox with N/V and abd cramping. He eventually had Abd CT and was given cramping medication. He was in the ER for a long time and finally discharged after medication prescribed. Today he is doing well. Headache, visual problems, nasal congestion are all cleared up now.    Prior Medications Reviewed Using: Patient Recall  Current Allergies (reviewed today): ! * AMOXICILLIN LIPITOR MORPHINE * CRESTOR ZOCOR  Past Surgical History:    CABG ([facility] ) 1989    PTCA x2 (Dr Pulsipher) 2001    Choleycystectomy 09/2001    Colonoscopy, polypectomy x multiple (Dr Mechele Collin) (05/09/2003)    EGD- Barrett's (05/09/2003)    Cardiolite- h/o vessel infarct EF 66% (12/2001)    Stress myoview- normal EF 70% (09/30/2004)    Coronary artery bypass graft    PTCA/stent (12/05/2005)    [facility] (03/28-03/31/2007[facility]    Stress myoview- no change, c/w 09/2004 (02/11/2006)    Stress Myoview Abnml w/prior inf infarct No signif ischemia (02/22/2008)    Nasal Surgery (Dr Chestine Spore) 06/23/2008    Risk Factors:  Passive smoke exposure:  no Drug use:  no HIV high-risk behavior:  no Caffeine use:  0 drinks per day Alcohol use:  yes    Type:  Beer, [place]    Drinks per day:  1    Has patient --       Felt need to cut down:  no       Been annoyed by complaints:  no       Felt guilty about drinking:  no       Needed eye opener in the morning:  no    Counseled to quit/cut down alcohol use:  no Exercise:   no Seatbelt use:  100 %   Review of Systems  General      Denies chills, fatigue, fever, loss of appetite, malaise, sleep disorder, sweats, weakness, and weight loss.  Eyes      Denies blurring, discharge, double vision, eye irritation, eye pain, halos, itching, light sensitivity, red eye, vision loss-1 eye, and vision loss-both eyes.  ENT      Complains of decreased hearing.      Denies difficulty swallowing, ear discharge, earache, hoarseness, nasal congestion, nosebleeds, postnasal drainage, ringing in ears, sinus pressure, and sore throat.  CV      Denies bluish discoloration of lips or nails, chest pain or discomfort, difficulty breathing at night, difficulty breathing while lying down, fainting, fatigue, leg cramps with exertion, lightheadness, near fainting, palpitations, shortness of breath with exertion, swelling of feet, swelling of hands, and weight gain.  Resp      Denies chest discomfort, chest pain with inspiration, cough, coughing up blood, excessive snoring, hypersomnolence, morning headaches, pleuritic, shortness of breath, sputum productive, and wheezing.  GI  Denies abdominal pain, bloody stools, change in bowel habits, constipation, dark tarry stools, diarrhea, excessive appetite, gas, hemorrhoids, indigestion, loss of appetite, nausea, vomiting, vomiting blood, and yellowish skin color.  GU      Denies decreased libido, discharge, dysuria, erectile dysfunction, genital sores, hematuria, incontinence, nocturia, urinary frequency, and urinary hesitancy.  MS      Denies joint pain, joint redness, joint swelling, loss of strength, low back pain, mid back pain, muscle aches, muscle , cramps, muscle weakness, stiffness, and thoracic pain.  Neuro      Denies brief paralysis, difficulty with concentration, disturbances in coordination, falling down, headaches, inability to speak, memory loss, numbness, poor balance, seizures, sensation of room spinning, tingling,  tremors, visual disturbances, and weakness.     Impression & Recommendations:  Problem # 1:  SPECIAL SCREENING MALIGNANT NEOPLASM OF PROSTATE (ICD-V76.44)  Problem # 2:  CHRONIC RHINITIS (ICD-472.0)  Problem # 3:  GILBERT'S SYNDROME (ICD-277.4) Assessment: Improved Stable.  Problem # 4:  HYPERGLYCEMIA (ICD-790.29) Assessment: Unchanged Stable, discussed avoiding sweets and carbs. Labs Reviewed: Creat: 0.7 (08/30/2008)   Microalbumin: 0.2 (08/30/2008)   Problem # 5:  Hx of HYPERCHOLESTEROLEMIA (ICD-272.0) Assessment: Improved Good numbers on Simvastatin. Continue. His updated medication list for this problem includes:    Simvastatin 80 Mg Tabs (Simvastatin) .Marland Kitchen... 1 tab by mouth at night.  Labs Reviewed: Chol: 120 (08/30/2008)   HDL: 30.6 (08/30/2008)   LDL: 73 (08/30/2008)   TG: 83 (08/30/2008) SGOT: 24 (08/30/2008)   SGPT: 32 (08/30/2008)   Problem # 6:  HYPERTENSION (ICD-401.9) Assessment: Unchanged  His updated medication list for this problem includes:    Metoprolol Succinate 50 Mg Tb24 (Metoprolol succinate) ..... One half tablet by mouth in morning only  BP today: 140/80 Prior BP: 120/68 (05/17/2008)  Labs Reviewed: Creat: 0.7 (08/30/2008) Chol: 120 (08/30/2008)   HDL: 30.6 (08/30/2008)   LDL: 73 (08/30/2008)   TG: 83 (08/30/2008)   Problem # 7:  CORONARY ARTERY DISEASE (ICD-414.00) Assessment: Unchanged Stable. His updated medication list for this problem includes:    Metoprolol Succinate 50 Mg Tb24 (Metoprolol succinate) ..... One half tablet by mouth in morning only    Nitrostat 0.4 Mg Subl (Nitroglycerin) .Marland Kitchen... Take by mouth as directed prn    Adult Aspirin Low Strength 81 Mg Tbdp (Aspirin) ..... One a day   Problem # 8:  COLONIC POLYPS (ICD-211.3) Assessment: Unchanged Will call with time for colonoscopy...wants to go to [facility].  Complete Medication List: 1)  Metoprolol Succinate 50 Mg Tb24 (Metoprolol succinate) .... One half tablet by mouth in  morning only 2)  Nitrostat 0.4 Mg Subl (Nitroglycerin) .... Take by mouth as directed prn 3)  Simvastatin 80 Mg Tabs (Simvastatin) .Marland Kitchen.. 1 tab by mouth at night. 4)  Adult Aspirin Low Strength 81 Mg Tbdp (Aspirin) .... One a day   Patient Instructions: 1)  Needs colonosvcopy, he'll call back.   ]

## 2010-10-08 NOTE — Procedures (Signed)
Summary: EGD   EGD  Procedure date:  11/15/2008  Findings:      Location: [facility]    Procedures Next Due Date:    EGD: 11/2010  ENDOSCOPY PROCEDURE REPORT  PATIENT:  Barry Small, Barry Small  MR#:  [id number] BIRTHDATE:   05/06/1934   GENDER:   male  ENDOSCOPIST:   Venita Lick. Russella Dar, MD, [facility] Referred by: Laurita Quint, M.D.  PROCEDURE DATE:  11/15/2008 PROCEDURE:  EGD with biopsy ASA CLASS:   Class II INDICATIONS: follow-up of Barrett's Esophagus   MEDICATIONS:   There was residual sedation effect present from prior procedure, Fentanyl 25 mcg IV, Versed 1 mg IV TOPICAL ANESTHETIC:   Exactacain Spray  DESCRIPTION OF PROCEDURE:   After the risks benefits and alternatives of the procedure were thoroughly explained, informed consent was obtained.  The [facility] [id number]-H180 [id number] endoscope was introduced through the mouth and advanced to the second portion of the duodenum, without limitations.  The instrument was slowly withdrawn as the mucosa was fully examined. <<PROCEDUREIMAGES>>            <<OLD IMAGES>>  Barrett's esophagus was found in the distal esophagus. It was 8 cm in length and erythematous from 28-36 cm. Multiple biopsies were obtained and sent to pathology. Erosions just proximal to the Barretts. LA Class Grade A. Otherwise the examination was normal. Retroflexed views revealed a hiatal hernia, 4 cm, sliding. The scope was then withdrawn from the patient and the procedure completed.  COMPLICATIONS:   None  ENDOSCOPIC IMPRESSION:  1) 8 cm barrett's esophagus in the distal esophagus 2) Erosive esophagitis  2) A hiatal hernia RECOMMENDATIONS:  1) anti-reflux regimen  2) PPI qam indefinitely  REPEAT EXAM:   In 2 year(s) for EGD.  if no dysplasia    Malcolm T. Russella Dar, MD, [facility]    CC: Laurita Quint, MD    REPORT OF SURGICAL PATHOLOGY   Case #: [id number] Patient Name: JAYTHAN, HINELY. Office Chart Number:  [id number]   MRN:  [id number] Pathologist: Havery Moros, MD DOB/Age  [birth year] (Age: 77)    Gender: M Date Taken:  11/15/2008 Date Received: 11/16/2008   FINAL DIAGNOSIS   ***MICROSCOPIC EXAMINATION AND DIAGNOSIS***   ESOPHAGUS, BIOPSY:   -  INTESTINAL METAPLASIA CONSISTENT BARRETT' S MUCOSA -  FOCAL GLANDULAR ATYPIA -  SEE COMMENT   COMMENT There is florid intestinal metaplasia consistent with Barrett' s esophagus.  One of the fragments in particular displays glandular atypia that appears to involve the basal and mid zones of the mucosa with no clear cut involvement of the surface.  The findings are atypical but indeterminate for low grade glandular dysplasia.  Clinical correlation and follow-up is recommended.  An Alcian Blue stain is performed to determine the presence of intestinal metaplasia (goblet cell metaplasia). The Alcian Blue stain shows intestinal metaplasia (goblet cell metaplasia).  The control stained appropriately.  (BNS:kv 11-17-08)   kv Date Reported:  11/17/2008     Havery Moros, MD *** Electronically Signed Out By BNS ***  November 17, 2008 MRN: [id number]    Barry Small [address] [place], [place]  [address]    Dear [name],  I am pleased to inform you that the biopsies taken during your recent endoscopic examination did not show any evidence of cancer upon pathologic examination.  However, your biopsies indicate you have a condition known as Barrett's esophagus. While not cancer, it is pre-cancerous (can progress to cancer) and needs to be monitored with repeat endoscopic  examination and biopsies.  Fortunately, it is quite rare that this develops into cancer, but careful monitoring of the condition along with taking your medication as prescribed is important in reducing the risk of developing cancer.  It is my recommendation that you have a repeat upper gastrointestinal endoscopic examination in 2 years.  Continue with treatment plan as outlined the day of  your exam.  Please call us if you have or develop heartburn, reflux symptoms, any swallowing problems, or if you have questions about your condition that have not been fully answered at this time.  Sincerely,  Meryl Dare MD [facility]  This letter has been electronically signed by your physician.   This report was created from the original endoscopy report, which was reviewed and signed by the above listed endoscopist.   Appended Document: EGD    Clinical Lists Changes  Observations: Added new observation of PAST SURG HX: CABG ([facility] ) 1989 PTCA x2 (Dr Pulsipher) 2001 Choleycystectomy 09/2001 Colonoscopy, polypectomy x multiple (Dr Mechele Collin) (05/09/2003) EGD- Barrett's (05/09/2003) Cardiolite- h/o vessel infarct EF 66% (12/2001) Stress myoview- normal EF 70% (09/30/2004) Coronary artery bypass graft PTCA/stent (12/05/2005) [facility]  [facility] (03/28-03/31/2007) Stress myoview- no change, c/w 09/2004 (02/11/2006) Stress Myoview Abnml w/prior inf infarct No signif ischemia (02/22/2008) Nasal Surgery (Dr Chestine Spore) 06/23/2008 Colonoscopy Mild Divertics (Dr Russella Dar) 11/15/08           5 yrs. EGD Barrett's Esoph Erosive Esoph HH (Dr Russella Dar) 11/17/08    2 yrs. (11/20/2008 17:57)       Past Surgical History:    CABG ([facility] ) 1989    PTCA x2 (Dr Pulsipher) 2001    Choleycystectomy 09/2001    Colonoscopy, polypectomy x multiple (Dr Mechele Collin) (05/09/2003)    EGD- Barrett's (05/09/2003)    Cardiolite- h/o vessel infarct EF 66% (12/2001)    Stress myoview- normal EF 70% (09/30/2004)    Coronary artery bypass graft    PTCA/stent (12/05/2005)    [facility] (03/28-03/31/2007)    Stress myoview- no change, c/w 09/2004 (02/11/2006)    Stress Myoview Abnml w/prior inf infarct No signif ischemia (02/22/2008)    Nasal Surgery (Dr Chestine Spore) 06/23/2008    Colonoscopy Mild Divertics (Dr Russella Dar) 11/15/08           5 yrs.    EGD Barrett's Esoph Erosive Esoph HH (Dr Russella Dar) 11/17/08    2  yrs.

## 2010-10-08 NOTE — Procedures (Signed)
Summary: Upper GI Endoscopy/[facility]  Upper GI Endoscopy/[facility]   Imported By: Sherian Rein 11/10/2008 11:56:21  _____________________________________________________________________  External Attachment:    Type:   Image     Comment:   External Document

## 2010-10-08 NOTE — Progress Notes (Signed)
Summary: wants referrals  Phone Note Call from Patient Call back at [phone]   Caller: Spouse Call For: Shaune Leeks MD Summary of Call: Pt wants referral for endoscopy and colonoscopy.  Wants Dr. Christena Flake? at [facility]. Initial call taken by: Lowella Petties,  September 26, 2008 2:09 PM  Follow-up for Phone Call        DR STARK ON 11/01/2008 AT 9:15.   Additional Follow-up for Phone Call Additional follow up Details #1::       Additional Follow-up by: Carlton Adam,  September 26, 2008 3:22 PM

## 2010-10-08 NOTE — Procedures (Signed)
Summary: Colonoscopy   Colonoscopy  Procedure date:  11/15/2008  Findings:      Location:  [facility].    Procedures Next Due Date:    Colonoscopy: 11/2013  COLONOSCOPY PROCEDURE REPORT  PATIENT:  Barry Small, Barry Small  MR#:  [id number] BIRTHDATE:   [birth year]   GENDER:   male  ENDOSCOPIST:   Venita Lick. Russella Dar, MD, [facility] Referred by: Laurita Quint, M.D.  PROCEDURE DATE:  11/15/2008 PROCEDURE:  Higher-risk screening colonoscopy  colonoscopy via colostomy ASA CLASS:   Class II INDICATIONS: 1) follow-up of polyp, tubular adenoma, 04/2003.  MEDICATIONS:    Fentanyl 75 mcg IV, Versed 9 mg IV  DESCRIPTION OF PROCEDURE:   After the risks benefits and alternatives of the procedure were thoroughly explained, informed consent was obtained.  Digital rectal exam was performed and revealed no abnormalities.   The [facility] [id number]-H180AL [id number] endoscope was introduced through the anus and advanced to the cecum, which was identified by both the appendix and ileocecal valve, without limitations.  The quality of the prep was good, using MoviPrep.  The instrument was then slowly withdrawn as the colon was fully examined. <<PROCEDUREIMAGES>>            <<OLD IMAGES>>  FINDINGS:  Mild diverticulosis was found in the sigmoid colon. This was otherwise a normal examination. Retroflexed views in the rectum revealed no abnormalities. The time to cecum =  1.33  minutes. The scope was then withdrawn (time =  8  min) from the patient and the procedure completed.  COMPLICATIONS:   None  ENDOSCOPIC IMPRESSION:  1) Mild diverticulosis RECOMMENDATIONS:  1) high fiber diet  REPEAT EXAM:   In 5 year(s) for Colonoscopy.    Venita Lick. Russella Dar, MD, [facility]    CC: Laurita Quint, MD   Appended Document: Colonoscopy    Clinical Lists Changes  Observations: Added new observation of PAST SURG HX: CABG ([facility] ) 1989 PTCA x2 (Dr Pulsipher) 2001 Choleycystectomy 09/2001 Colonoscopy, polypectomy x  multiple (Dr Mechele Collin) (05/09/2003) EGD- Barrett's (05/09/2003) Cardiolite- h/o vessel infarct EF 66% (12/2001) Stress myoview- normal EF 70% (09/30/2004) Coronary artery bypass graft PTCA/stent (12/05/2005) [facility] ([facility]-03/31/2007) Stress myoview- no change, c/w 09/2004 (02/11/2006) Stress Myoview Abnml w/prior inf infarct No signif ischemia (02/22/2008) Nasal Surgery (Dr Chestine Spore) 06/23/2008 Colonoscopy Mild Divertics (Dr Russella Dar) 11/15/08           5 yrs. (11/15/2008 17:46)       Past Surgical History:    CABG ([facility] ) 1989    PTCA x2 (Dr Pulsipher) 2001    Choleycystectomy 09/2001    Colonoscopy, polypectomy x multiple (Dr Mechele Collin) (05/09/2003)    EGD- Barrett's (05/09/2003)    Cardiolite- h/o vessel infarct EF 66% (12/2001)    Stress myoview- normal EF 70% (09/30/2004)    Coronary artery bypass graft    PTCA/stent (12/05/2005)    [facility] ([facility]/28-03/31/2007)    Stress myoview- no change, c/w 09/2004 (02/11/2006)    Stress Myoview Abnml w/prior inf infarct No signif ischemia (02/22/2008)    Nasal Surgery (Dr Chestine Spore) 06/23/2008    Colonoscopy Mild Divertics (Dr Russella Dar) 11/15/08           5 yrs.

## 2010-10-08 NOTE — Assessment & Plan Note (Signed)
Summary: CHECK BP/MK   Vital Signs:  Patient profile:   77 year old male Height:      67 inches Weight:      187 pounds Temp:     98.1 degrees F oral Pulse rate:   60 / minute Pulse rhythm:   regular BP sitting:   160 / 90  (left arm) Cuff size:   regular  Vitals Entered By: Providence Crosby LPN (April 24, 2009 10:30 AM) CC: chest pain// at beach x 2 months  last 2 weeks systolic bp going up// lots of gas also   History of Present Illness: Pt hasw been at the beach for two months, some of the time was building a deck in the hot sun. He has noticed his blood pressure has been elevated, highest systolic was 156, today 160 wiyth diastolic 76-82 regularly, today 90. On the way here he was held up by traffic. He has been hyper lately. He is agitated getting here but beginning to settle down. He has also had instances of chest pain in the upper abd, liower chest oon the left side which is not precipitated by exertion, comes and goes without relAZtion to anything he can determine, and does not seem to cause nausea or sweating out of the ordinary.  Problems Prior to Update: 1)  Abdominal Pain-multiple Sites  (ICD-789.09) 2)  Barrett's Esophagus  (ICD-530.85) 3)  Colonic Polyps, Hx of  (ICD-V12.72) 4)  Colonic Polyps  (ICD-211.3) 5)  Special Screening Malignant Neoplasm of Prostate  (ICD-V76.44) 6)  Chronic Rhinitis  (ICD-472.0) 7)  Shoulder Pain, Right  (ICD-719.41) 8)  Gilbert's Syndrome  (ICD-277.4) 9)  Hyperglycemia  (ICD-790.29) 10)  Hx of Hypercholesterolemia  (ICD-272.0) 11)  Hypertension  (ICD-401.9) 12)  Gerd  (ICD-530.81) 13)  Coronary Artery Disease  (ICD-414.00)  Medications Prior to Update: 1)  Metoprolol Succinate 50 Mg  Tb24 (Metoprolol Succinate) .... One Half Tablet By Mouth in Morning Only 2)  Nitrostat 0.4 Mg  Subl (Nitroglycerin) .... Take By Mouth As Directed Prn 3)  Simvastatin 80 Mg  Tabs (Simvastatin) .Marland Kitchen.. 1 Tab By Mouth At Night. 4)  Adult Aspirin Low Strength 81  Mg Tbdp (Aspirin) .... One A Day 5)  Ranitidine Hcl 150 Mg Caps (Ranitidine Hcl) .... As Needed 6)  Multivitamins  Tabs (Multiple Vitamin) .... Once Daily 7)  B-12 1000 Mcg Cr-Tabs (Cyanocobalamin) .... Once Daily 8)  Vitamin D 1000 Unit Tabs (Cholecalciferol) .... Once Daily 9)  Calcium 500/d 500-200 Mg-Unit Tabs (Calcium Carbonate-Vitamin D) .... Once Daily 10)  Omeprazole 40 Mg  Cpdr (Omeprazole) .Marland Kitchen.. 1 Each Day 30 Minutes Before Meal  Allergies: 1)  ! * Amoxicillin 2)  Lipitor 3)  Morphine 4)  * Crestor 5)  Zocor  Physical Exam  General:  Well developed, well nourished, no acute distress. Comfortable but  mildy agitated (from traffic driving to the office) when seen Head:  Normocephalic and atraumatic. Eyes:  PERRLA, no icterus. Conjunctiva clear bilaterally.  Ears:  Normal auditory acuity. TMs B-9. Nose:  External nasal examination shows no deformity or inflammation. Nasal mucosa are pink and moist without lesions or exudates. Mouth:  No deformity or lesions, dentition normal. Neck:  Supple; no masses or thyromegaly. Chest Wall:  No deformities, masses, tenderness or gynecomastia noted. Lungs:  Normal respiratory effort, chest expands symmetrically. Lungs are clear to auscultation, no crackles or wheezes. Heart:  Regular rate and rhythm; no murmurs, rubs,  or bruits.   Impression & Recommendations:  Problem #  1:  HYPERTENSION (ICD-401.9) Assessment Deteriorated  Elevated lately. Will add Lisinopril. His updated medication list for this problem includes:    Metoprolol Succinate 50 Mg Tb24 (Metoprolol succinate) ..... One half tablet by mouth in morning only    Lisinopril 5 Mg Tabs (Lisinopril) ..... One tab by mouth at night  BP today: 160/90 Prior BP: 140/78 (11/01/2008)  Labs Reviewed: K+: 4.4 (08/30/2008) Creat: : 0.7 (08/30/2008)   Chol: 120 (08/30/2008)   HDL: 30.6 (08/30/2008)   LDL: 73 (08/30/2008)   TG: 83 (08/30/2008)  Orders: EKG w/ Interpretation  ([address])  Problem # 2:  CORONARY ARTERY DISEASE (ICD-414.00) Assessment: Unchanged  Seems stable. EKG unchangeed....shows old MI, ant/inf. His updated medication list for this problem includes:    Metoprolol Succinate 50 Mg Tb24 (Metoprolol succinate) ..... One half tablet by mouth in morning only    Nitrostat 0.4 Mg Subl (Nitroglycerin) .Marland Kitchen... Take by mouth as directed prn    Adult Aspirin Low Strength 81 Mg Tbdp (Aspirin) ..... One a day by mouth    Lisinopril 5 Mg Tabs (Lisinopril) ..... One tab by mouth at night  Labs Reviewed: Chol: 120 (08/30/2008)   HDL: 30.6 (08/30/2008)   LDL: 73 (08/30/2008)   TG: 83 (08/30/2008)  Orders: EKG w/ Interpretation ([address])  Complete Medication List: 1)  Metoprolol Succinate 50 Mg Tb24 (Metoprolol succinate) .... One half tablet by mouth in morning only 2)  Nitrostat 0.4 Mg Subl (Nitroglycerin) .... Take by mouth as directed prn 3)  Simvastatin 80 Mg Tabs (Simvastatin) .Marland Kitchen.. 1 tab by mouth at night. 4)  Adult Aspirin Low Strength 81 Mg Tbdp (Aspirin) .... One a day by mouth 5)  Ranitidine Hcl 150 Mg Caps (Ranitidine hcl) .... As needed 6)  Multivitamins Tabs (Multiple vitamin) .... Once daily by mouth 7)  B-12 1000 Mcg Cr-tabs (Cyanocobalamin) .... Once daily by mouth 8)  Vitamin D 1000 Unit Tabs (Cholecalciferol) .... Once daily by mouth 9)  Calcium 500/d 500-200 Mg-unit Tabs (Calcium carbonate-vitamin d) .... Once daily by mouth 10)  Omeprazole 40 Mg Cpdr (Omeprazole) .Marland Kitchen.. 1 each day 30 minutes before meal 11)  Lisinopril 5 Mg Tabs (Lisinopril) .... One tab by mouth at night  Patient Instructions: 1)  RTC 6 weeks. 2)  Get BMET Sep 10 Prescriptions: LISINOPRIL 5 MG TABS (LISINOPRIL) one tab by mouth at night  #30 x 12   Entered and Authorized by:   Shaune Leeks MD   Signed by:   Shaune Leeks MD on 04/24/2009   Method used:   [facility]       [address]        [place], [place]  [address]       Ph: [phone]       Fax: [phone]   RxID:   [id number]

## 2010-10-08 NOTE — Miscellaneous (Signed)
Summary: Immunizations/Zostavax Shingles/2008 Documentation Administered   Immunizations/Zostavax Shingles/2008 Documentation Administered @ [facility]   Imported By: Mickle Asper 07/23/2007 14:24:39  _____________________________________________________________________  External Attachment:    Type:   Image     Comment:   External Document  Appended Document: Immunizations/Zostavax Shingles/2008 Documentation Administered     Clinical Lists Changes  Observations: Added new observation of ZOSTAVAX: Zostavax (07/14/2007 18:20)        Other Immunization History:    Zostavax # 1:  Zostavax (07/14/2007)

## 2010-10-08 NOTE — Procedures (Signed)
Summary: Upper GI endoscopy by Dr.Robert Mechele Collin  Upper GI endoscopy by Dr.Robert Mechele Collin   Imported By: Beau Fanny 09/26/2008 15:36:39  _____________________________________________________________________  External Attachment:    Type:   Image     Comment:   External Document

## 2010-10-08 NOTE — Assessment & Plan Note (Signed)
Summary: SCREEN FOR ECL/YF   History of Present Illness Visit Type: consult Primary GI MD: Elie Goody MD [facility] Primary Provider: Laurita Quint, MD Requesting Provider: Laurita Quint, MD Chief Complaint: colon cancer screening History of Present Illness:   This is a 77 year old male with a history of colon polyps and Barrett's esophagus followed by Dr. Mechele Collin. Colonoscopy and upper endoscopy were performed in August 2004. Five colon polyps were noted however the pathology is not available at this time. The patient states he was recommended to have a follow up colonoscopy at 5 years. Barrett's was noted at endoscopy and again the pathology is not available at this time. He does not recall the diagnosis of Barrett's and does not recall need for followup endoscopy or the need to remain on a proton pump inhibitor long-term. His history is wandering and difficult to follow. He states he has intermittent reflux symptoms he takes ranitidine and Prilosec daily as needed.  He was recently treated with amoxicillin for a sinus infection and developed crampy lower abdominal pain and mild diarrhea. He states that he presented to [facility] ED in January and an abd. CT scan was performed that showed abnormalities of his left colon. He states a rectal exam was negative for occult blood. He states they advised him to remain on amoxicillin but he did not do so as he felt he was experiencing amoxicillin side effects. The symptoms have gradually improved since discontinuing amoxicillin however the cramy pain persists.   GI Review of Systems    Reports abdominal pain, acid reflux, and  belching.     Location of  Abdominal pain: lower abdomen.    Denies bloating, chest pain, dysphagia with liquids, dysphagia with solids, heartburn, loss of appetite, nausea, vomiting, vomiting blood, weight loss, and  weight gain.      Reports constipation and  diarrhea.     Denies anal fissure, black tarry stools, change in bowel  habit, diverticulosis, fecal incontinence, heme positive stool, hemorrhoids, irritable bowel syndrome, jaundice, light color stool, liver problems, rectal bleeding, and  rectal pain.   Prior Medications Reviewed Using: List Brought by Patient  Updated Prior Medication List: METOPROLOL SUCCINATE 50 MG  TB24 (METOPROLOL SUCCINATE) one half TABLET BY MOUTH IN MORNING ONLY NITROSTAT 0.4 MG  SUBL (NITROGLYCERIN) take by mouth as directed prn SIMVASTATIN 80 MG  TABS (SIMVASTATIN) 1 tab by mouth at night. ADULT ASPIRIN LOW STRENGTH 81 MG TBDP (ASPIRIN) one a day RANITIDINE HCL 150 MG CAPS (RANITIDINE HCL) as needed MULTIVITAMINS  TABS (MULTIPLE VITAMIN) once daily B-12 1000 MCG CR-TABS (CYANOCOBALAMIN) once daily VITAMIN D 1000 UNIT TABS (CHOLECALCIFEROL) once daily CALCIUM 500/D 500-200 MG-UNIT TABS (CALCIUM CARBONATE-VITAMIN D) once daily  Current Allergies (reviewed today): ! * AMOXICILLIN LIPITOR MORPHINE * CRESTOR ZOCOR Past Medical History:    Reviewed history from 05/18/2007 and no changes required:       Coronary artery disease       GERD       Barrett's esophagus       Colon polyps, type unknown, 04/2003       Hypertension       Elevated glucose       Elevated bilirubin       Hyperlipidemia         Past Surgical History:    Reviewed history from 09/06/2008 and no changes required:       CABG ([facility] ) 1989       PTCA x2 (Dr Gerri Spore) 2001  Choleycystectomy 09/2001       Colonoscopy, polypectomy x multiple (Dr Mechele Collin) (05/09/2003)       EGD- Barrett's (05/09/2003)       Cardiolite- h/o vessel infarct EF 66% (12/2001)       Stress myoview- normal EF 70% (09/30/2004)       Coronary artery bypass graft       PTCA/stent (12/05/2005)       [facility] 1 (03/28-03/31/2007)       Stress myoview- no change, c/w 09/2004 (02/11/2006)       Stress Myoview Abnml w/prior inf infarct No signif ischemia (02/22/2008)       Nasal Surgery (Dr Chestine Spore) 06/23/2008  Family  History:    Reviewed history from 05/18/2007 and no changes required:       Father: died age 94 of heart problems       Mother: died age 40- pneumonia       Siblings: none  Social History:    Marital Status: Married    Children: 2    Occupation: retired Engineer, structural    Alcohol Use - yes -2    Daily Caffeine Use -1    Illicit Drug Use - no  Risk Factors:  Drug use:  no Alcohol use:  yes  Review of Systems       The patient complains of allergy/sinus, headaches-new, and hearing problems.         The pertinent positives and negatives are noted as above and in the HPI. All other ROS were negative.   Vital Signs:  Patient Profile:   77 Years Old Male Height:     67 inches Weight:      189.25 pounds BMI:     29.75 Pulse rate:   64 / minute Pulse rhythm:   regular BP sitting:   140 / 78  (left arm)  Vitals Entered By: June McMurray CMA (November 01, 2008 9:34 AM)                  Physical Exam  General:     Well developed, well nourished, no acute distress. Head:     Normocephalic and atraumatic. Eyes:     PERRLA, no icterus. Ears:     Normal auditory acuity. Mouth:     No deformity or lesions, dentition normal. Neck:     Supple; no masses or thyromegaly. Lungs:     Clear throughout to auscultation. Heart:     Regular rate and rhythm; no murmurs, rubs,  or bruits. Abdomen:     Soft, nontender and nondistended. No masses, hepatosplenomegaly or hernias noted. Normal bowel sounds. Rectal:     recent exam at [facility] ED was negative by patient reportdeferred until time of colonoscopy.   Msk:     Symmetrical with no gross deformities. Normal posture. Pulses:     Normal pulses noted. Extremities:     No clubbing, cyanosis, edema or deformities noted. Neurologic:     Alert and  oriented x4;  grossly normal neurologically. Cervical Nodes:     No significant cervical adenopathy. Psych:     Alert and cooperative. Normal mood and affect.anxious.     Impression  & Recommendations:  Problem # 1:  COLONIC POLYPS, HX OF (ICD-V12.72) History of multiple colon polyps- type unknown. Will request records for pathology report. The risks, benefits and alternatives to colonoscopy with possible biopsy and possible polypectomy were discussed with the patient and they consent to proceed. The  procedure will be scheduled electively. Orders: Colon/Endo (Colon/Endo)   Problem # 2:  BARRETT'S ESOPHAGUS (ICD-530.85) GERD with intermittent symptoms and history of Barrett's esophagus. Will request records for pathology report. He is strongly advised to follow all antireflux measures long-term. He is strongly advised to take Prilosec 20 mg daily on an ongoing basis for long-term management of GERD with Barrett's. The risks, benefits and alternatives to endoscopy with possible biopsy and possible dilation were discussed with the patient and they consent to proceed. The procedure will be scheduled electively.    Problem # 3:  ABDOMINAL PAIN-MULTIPLE SITES (ICD-789.09) Crampy lower abdominal pain and slight variation in bowel habits may be related to amoxicillin associated diarrhea or resolving C. difficile infection. Will attempt to obtain the CT scan report from [facility] ED. Begin Align once daily for the next month. Further evaluation with colonoscopy as an problem #1.   Patient Instructions: 1)  You have been scheduled for a EGD/Colonoscopy.  2)  Colonoscopy brochure given. 3)  Conscious Sedation brochure given. 4)  Upper Endoscopy brochure given. 5)  Start Align one tablet by mouth once daily x 1 month. 6)  Copy Sent To: Laurita Quint, MD  Prescriptions: MOVIPREP 100 GM  SOLR (PEG-KCL-NACL-NASULF-NA ASC-C) As per prep instructions.  #1 x 0   Entered by:   Christie Nottingham CMA   Authorized by:   Meryl Dare MD [facility]   Signed by:   Christie Nottingham CMA on 11/01/2008   Method used:   Electronically to        [facility] (retail)       [address]       [place], [place]  [address]       Ph: [phone]       Fax: [phone]   RxID:   [id number]

## 2010-10-08 NOTE — Assessment & Plan Note (Signed)
Summary: CHECK BP   Vital Signs:  Patient Profile:   77 Years Old Male Weight:      187 pounds Temp:     97.8 degrees F oral Pulse rate:   76 / minute Pulse rhythm:   regular BP sitting:   150 / 80  (left arm) Cuff size:   regular  Vitals Entered By: Providence Crosby (April 05, 2008 10:02 AM)                 Chief Complaint:  insurance would not pay for atacand// dr. wall put him on lisinopril 20mg  [place] dizzy and feeling bad started taking 1/2 tablet still feels bad.  History of Present Illness: Went to Dr Daleen Squibb because insurance wouldn't pay for Atacand. Was put on Lisinopril 20mg  and after 6-7 weeks had nausea, spells of dizziness and muscle pains.  He quit taking completely after taking half and sxs have improved. BP was 100/60 at one point. He also complains of congestion and pain between the eyes w/o fever, chills, overt headache, ear pain or cough. He has minimal nasal congestion and has taken nothing.    Prior Medications Reviewed Using: Patient Recall  Current Allergies (reviewed today): LIPITOR MORPHINE * CRESTOR ZOCOR      Physical Exam  General:     Well-developed,well-nourished,in no acute distress; alert,appropriate and cooperative throughout examination Head:     Normocephalic and atraumatic without obvious abnormalities. No apparent alopecia or balding. Sinuses nontender, mildly tender between the eyes. Eyes:     Conjunctiva clear bilaterally.  Ears:     External ear exam shows no significant lesions or deformities.  Otoscopic examination reveals clear canals, tympanic membranes are intact bilaterally without bulging, retraction, inflammation or discharge. Hearing is grossly normal bilaterally. Nose:     External nasal examination shows no deformity or inflammation. Nasal mucosa are pink and moist without lesions or exudates. Mouth:     Oral mucosa and oropharynx without lesions or exudates.  Teeth in good repair. Neck:     No  deformities, masses, or tenderness noted. Chest Wall:     No deformities, masses, tenderness or gynecomastia noted. Lungs:     Normal respiratory effort, chest expands symmetrically. Lungs are clear to auscultation, no crackles or wheezes. Heart:     Normal rate and regular rhythm. S1 and S2 normal without gallop, murmur, click, rub or other extra sounds.    Impression & Recommendations:  Problem # 1:  HYPERTENSION (ICD-401.9) Assessment: Unchanged Stop Atacand, stop Lisinopril. Increase Metoprolol to 1/2 twice a day. RTC 6 weeks for recheck. The following medications were removed from the medication list:    Atacand 16 Mg Tabs (Candesartan cilexetil) ..... One by mouth qd  His updated medication list for this problem includes:    Metoprolol Succinate 50 Mg Tb24 (Metoprolol succinate) ..... One half by mouth two times a day    Lisinopril 20 Mg Tabs (Lisinopril) .Marland Kitchen... 1 daily by mouth  BP today: 150/80 Prior BP: 130/80 (05/28/2007)  Labs Reviewed: Creat: 0.8 (11/20/2006) Chol: 126 (08/18/2007)   HDL: 30.1 (08/18/2007)   LDL: 83 (08/18/2007)   TG: 67 (08/18/2007)   Problem # 2:  URI (ICD-465.9) Assessment: New Sinus congestion. Try Mucous Relief. His updated medication list for this problem includes:    Adult Aspirin Low Strength 81 Mg Tbdp (Aspirin) ..... One by mouth qd Instructed on symptomatic treatment. Call if symptoms persist or worsen.   Complete Medication List: 1)  Metoprolol Succinate  50 Mg Tb24 (Metoprolol succinate) .... One half by mouth two times a day 2)  Adult Aspirin Low Strength 81 Mg Tbdp (Aspirin) .... One by mouth qd 3)  Nitrostat 0.4 Mg Subl (Nitroglycerin) .... Take by mouth as directed prn 4)  Simvastatin 80 Mg Tabs (Simvastatin) .Marland Kitchen.. 1 tab by mouth at night. 5)  Lisinopril 20 Mg Tabs (Lisinopril) .Marland Kitchen.. 1 daily by mouth   Patient Instructions: 1)  Take Guaifenesin by going to [facility] or [facility] and getting MUCOUS RELIEF EXPECTORANT  (400mg ), take 11/2 tabs by mouth AM and NOON. 2)  Drink lots of fluids anytime taking Guaifenesin.  3)  RTC 6 weeks, pls schedule COMP EXAM when able with labs prior.   Prescriptions: METOPROLOL SUCCINATE 50 MG  TB24 (METOPROLOL SUCCINATE) one half by mouth two times a day  #60 x 12   Entered and Authorized by:   Shaune Leeks MD   Signed by:   Shaune Leeks MD on 04/05/2008   Method used:   Print then Give to Patient   RxID:   [id number]  ]

## 2010-10-08 NOTE — Assessment & Plan Note (Signed)
Summary: F/U 1 YEAR  Medications Added CRESTOR 20 MG TABS (ROSUVASTATIN CALCIUM) Take 1/2-1 tablet by mouth daily.      Allergies Added:   Visit Type:  Initial Consult Referring Provider:  Maisie Fus Wall,M.D. Primary Provider:  Laurita Quint, MD  CC:  no complaints.  History of Present Illness: [name] is a pleasant 77 year old gentleman with a history of coronary artery disease, 4 vessel bypass in 1989 at [facility], history of stenting in 2007 after presenting to [facility] with epigastric and chest discomfort, presented for routine followup.  Overall he states that he is doing well. He is active, plays golf, does a lot of gardening and has no symptoms of angina or epigastric discomfort. No significant lower extremity edema. Overall he has no complaints.  His last stress nuclear study was in June of 2009. EF was normal with an old inferior wall infarct scar. There was no ischemia.  Current Medications (verified): 1)  Metoprolol Tartrate 50 Mg Tabs (Metoprolol Tartrate) .... Take 1/2 Tablet By Mouth Twice A Day 2)  Nitrostat 0.4 Mg  Subl (Nitroglycerin) .... Take By Mouth As Directed As Needed 3)  Simvastatin 80 Mg  Tabs (Simvastatin) .Marland Kitchen.. 1 Tab By Mouth At Night. 4)  Adult Aspirin Low Strength 81 Mg Tbdp (Aspirin) .... One A Day By Mouth 5)  Multivitamins  Tabs (Multiple Vitamin) .... Once Daily By Mouth 6)  B-12 1000 Mcg Cr-Tabs (Cyanocobalamin) .... Once Daily By Mouth 7)  Vitamin D 1000 Unit Tabs (Cholecalciferol) .... Once Daily By Mouth 8)  Calcium 500/d 500-200 Mg-Unit Tabs (Calcium Carbonate-Vitamin D) .... Once Daily By Mouth 9)  Lisinopril 5 Mg Tabs (Lisinopril) .... One Tab By Mouth At Night 10)  Omeprazole 40 Mg Cpdr (Omeprazole) .... Take One By Mouth 30 Minutes Prior To Meal 11)  Anusol-Hc 25 Mg Supp (Hydrocortisone Acetate) .... One Supp Per Rectum Three Times A Day For One Week Then As Needed.  Allergies (verified): 1)  ! * Amoxicillin 2)  Lipitor 3)   Morphine 4)  * Crestor 5)  Zocor  Past History:  Past Medical History: Last updated: 11/01/2008 Coronary artery disease GERD Barrett's esophagus Colon polyps, type unknown, 04/2003 Hypertension Elevated glucose Elevated bilirubin Hyperlipidemia  Past Surgical History: Last updated: 11/20/2008 CABG ([facility] ) 1989 PTCA x2 (Dr Pulsipher) 2001 Choleycystectomy 09/2001 Colonoscopy, polypectomy x multiple (Dr Mechele Collin) (05/09/2003) EGD- Barrett's (05/09/2003) Cardiolite- h/o vessel infarct EF 66% (12/2001) Stress myoview- normal EF 70% (09/30/2004) Coronary artery bypass graft PTCA/stent (12/05/2005) [facility] (03/28-03/31/2007) Stress myoview- no change, c/w 09/2004 (02/11/2006) Stress Myoview Abnml w/prior inf infarct No signif ischemia (02/22/2008) Nasal Surgery (Dr Chestine Spore) 06/23/2008 Colonoscopy Mild Divertics (Dr Russella Dar) 11/15/08           5 yrs. EGD Barrett's Esoph Erosive Esoph HH (Dr Russella Dar) 11/17/08    2 yrs.  Family History: Last updated: 06-04-2007 Father: died age 61 of heart problems Mother: died age 69- pneumonia Siblings: none  Social History: Last updated: 11/01/2008 Marital Status: Married Children: 2 Occupation: retired Engineer, structural Alcohol Use - yes -2 Daily Caffeine Use -1 Illicit Drug Use - no  Risk Factors: Alcohol Use: <1 (11/01/2009) Caffeine Use: 0 (11/01/2009) Exercise: no (11/01/2009)  Risk Factors: Smoking Status: never (11/01/2009) Passive Smoke Exposure: no (11/01/2009)  Review of Systems  The patient denies fever, weight loss, weight gain, vision loss, decreased hearing, hoarseness, chest pain, syncope, dyspnea on exertion, peripheral edema, prolonged cough, abdominal pain, incontinence, muscle weakness, depression,  and enlarged lymph nodes.    Vital Signs:  Patient profile:   77 year old male Height:      67 inches Weight:      195 pounds BMI:     30.65 Pulse rate:   62 / minute BP sitting:   134 / 80  (right  arm) Cuff size:   regular  Vitals Entered By: Bishop Dublin, CMA (February 20, 2010 10:14 AM)  Physical Exam  General:  Well developed, well nourished, in no acute distress. Head:  normocephalic and atraumatic Neck:  Neck supple, no JVD. No masses, thyromegaly or abnormal cervical nodes. Lungs:  Clear bilaterally to auscultation and percussion. Heart:  Non-displaced PMI, chest non-tender; regular rate and rhythm, S1, S2 with I-II/VI SEM RSB, rubs or gallops. Carotid upstroke normal, no bruit. . Pedals normal pulses. No edema, no varicosities. Abdomen:  Bowel sounds positive; abdomen soft and non-tender without masses Msk:  Back normal, normal gait. Muscle strength and tone normal. Pulses:  pulses normal in all 4 extremities Extremities:  No clubbing or cyanosis. Neurologic:  Alert and oriented x 3. Skin:  Intact without lesions or rashes. Psych:  Normal affect.   New Orders:     1)  EKG w/ Interpretation (93[address])   Impression & Recommendations:  Problem # 1:  CORONARY ARTERY DISEASE (ICD-414.00) history of coronary artery disease, bypass surgery, stenting in 2007. Negative stress test in 2009 following up today with no symptoms of angina.  He is active, has been managed aggressively with medications. No changes were made at this time apart from the cholesterol change detailed below. No stress test ordered at this time as he has no symptoms.  His updated medication list for this problem includes:    Metoprolol Tartrate 50 Mg Tabs (Metoprolol tartrate) .Marland Kitchen... Take 1/2 tablet by mouth twice a day    Nitrostat 0.4 Mg Subl (Nitroglycerin) .Marland Kitchen... Take by mouth as directed as needed    Adult Aspirin Low Strength 81 Mg Tbdp (Aspirin) ..... One a day by mouth    Lisinopril 5 Mg Tabs (Lisinopril) ..... One tab by mouth at night  Orders: EKG w/ Interpretation ([address])  Problem # 2:  Hx of HYPERCHOLESTEROLEMIA (ICD-272.0) Given that his cholesterol is at goal on simvastatin 80, we will change  him given the new guidelines/restrictions on simvastatin to Crestor 10 mg daily with a check of his cholesterol in 2 months time. If his cholesterol is not at goal, we would increase this to 20 mg daily.  The following medications were removed from the medication list:    Simvastatin 80 Mg Tabs (Simvastatin) .Marland Kitchen... 1 tab by mouth at night. His updated medication list for this problem includes:    Crestor 20 Mg Tabs (Rosuvastatin calcium) .Marland Kitchen... Take 1/2-1 tablet by mouth daily.  Problem # 3:  HYPERTENSION (ICD-401.9) Blood pressure is reasonably well controlled on his current medication regimen.  His updated medication list for this problem includes:    Metoprolol Tartrate 50 Mg Tabs (Metoprolol tartrate) .Marland Kitchen... Take 1/2 tablet by mouth twice a day    Adult Aspirin Low Strength 81 Mg Tbdp (Aspirin) ..... One a day by mouth    Lisinopril 5 Mg Tabs (Lisinopril) ..... One tab by mouth at night  Patient Instructions: 1)  Your physician recommends that you return for a FASTING lipid profile: In 2 months (lipid/liver) 2)  Your physician has recommended you make the following change in your medication: STOP Simvastatin and START Crestor  3)  Your  physician wants you to follow-up in:   6 months You will receive a reminder letter in the mail two months in advance. If you don't receive a letter, please call our office to schedule the follow-up appointment. Prescriptions: CRESTOR 20 MG TABS (ROSUVASTATIN CALCIUM) Take 1/2-1 tablet by mouth daily.  #28 x 0   Entered by:   Cloyde Reams RN   Authorized by:   Dossie Arbour MD   Signed by:   Cloyde Reams RN on 02/20/2010   Method used:   Samples Given   RxID:   [id number] CRESTOR 20 MG TABS (ROSUVASTATIN CALCIUM) Take 1/2-1 tablet by mouth daily.  #30 x 3   Entered by:   Cloyde Reams RN   Authorized by:   Dossie Arbour MD   Signed by:   Cloyde Reams RN on 02/20/2010   Method used:   Print then Give to Patient   RxID:   [id number]

## 2010-10-08 NOTE — Miscellaneous (Signed)
Summary: omeprazole  Clinical Lists Changes  Medications: Added new medication of OMEPRAZOLE 40 MG  CPDR (OMEPRAZOLE) 1 each day 30 minutes before meal - Signed Rx of OMEPRAZOLE 40 MG  CPDR (OMEPRAZOLE) 1 each day 30 minutes before meal;  #30 x 11;  Signed;  Entered by: Burman Foster RN;  Authorized by: Meryl Dare MD Clementeen Graham;  Method used: Electronically to [facility] [address] [place], [place], [place]  [address], Ph: [phone], Fax: [phone]    Prescriptions: OMEPRAZOLE 40 MG  CPDR (OMEPRAZOLE) 1 each day 30 minutes before meal  #30 x 11   Entered by:   Burman Foster RN   Authorized by:   Meryl Dare MD [facility]   Signed by:   Burman Foster RN on 11/15/2008   Method used:   Electronically to        [facility] (retail)       [address]       [place], [place]  [address]       Ph: [phone]       Fax: [phone]   RxID:   [id number]

## 2010-10-08 NOTE — Letter (Signed)
Summary: Nadara Eaton letter  [place] at [facility]  [address] [place], [place] [address]   Phone: [phone]  Fax: [phone]       04/10/2010 MRN: [id number]  KIRTIS CHALLIS [address] [place], [place]  [address]  Dear Mr. Caralee Ates,  [facility], and [facility] announce the retirement of Arta Silence, M.D., from full-time practice at the [facility] office effective March 07, 2010 and his plans of returning part-time.  It is important to Dr. Hetty Ely and to our practice that you understand that [facility] has seven physicians in our office for your health care needs.  We will continue to offer the same exceptional care that you have today.    Dr. Hetty Ely has spoken to many of you about his plans for retirement and returning part-time in the fall.   We will continue to work with you through the transition to schedule appointments for you in the office and meet the high standards that [place] is committed to.   Again, it is with great pleasure that we share the news that Dr. Hetty Ely will return to [facility] at [facility] in October of 2011 with a reduced schedule.    If you have any questions, or would like to request an appointment with one of our physicians, please call us at [phone] and press the option for Scheduling an appointment.  We take pleasure in providing you with excellent patient care and look forward to seeing you at your next office visit.  Our [facility] Physicians are:  Tillman Abide, M.D. Laurita Quint, M.D. Roxy Manns, M.D. Kerby Nora, M.D. Hannah Beat, M.D. Ruthe Mannan, M.D. We proudly welcomed Raechel Ache, M.D. and Eustaquio Boyden, M.D. to the practice in July/August 2011.  Sincerely,  [facility]

## 2010-10-08 NOTE — Miscellaneous (Signed)
Summary: Urinalysis for DOT paperwork   Clinical Lists Changes  Observations: Added new observation of APPEARANCE U: Clear (08/12/2010 14:51) Added new observation of PH URINE: 6.0  (08/12/2010 14:51) Added new observation of SPEC GR URIN: 1.015  (08/12/2010 14:51) Added new observation of UA COLOR: yellow  (08/12/2010 14:51) Added new observation of WBC DIPSTK U: negative  (08/12/2010 14:51) Added new observation of NITRITE URN: negative  (08/12/2010 14:51) Added new observation of UROBILINOGEN: 0.2  (08/12/2010 14:51) Added new observation of PROTEIN, URN: negative  (08/12/2010 14:51) Added new observation of BLOOD UR DIP: negative  (08/12/2010 14:51) Added new observation of KETONES URN: negative  (08/12/2010 14:51) Added new observation of BILIRUBIN UR: negative  (08/12/2010 14:51) Added new observation of GLUCOSE, URN: negative  (08/12/2010 14:51)     Laboratory Results   Urine Tests  Date/Time Received: August 12, 2010 2:51 PM   Routine Urinalysis   Color: yellow Appearance: Clear Glucose: negative   (Normal Range: Negative) Bilirubin: negative   (Normal Range: Negative) Ketone: negative   (Normal Range: Negative) Spec. Gravity: 1.015   (Normal Range: 1.003-1.035) Blood: negative   (Normal Range: Negative) pH: 6.0   (Normal Range: 5.0-8.0) Protein: negative   (Normal Range: Negative) Urobilinogen: 0.2   (Normal Range: 0-1) Nitrite: negative   (Normal Range: Negative) Leukocyte Esterace: negative   (Normal Range: Negative)

## 2010-10-08 NOTE — Assessment & Plan Note (Signed)
Summary: 6 week follow-up/rl   Vital Signs:  Patient profile:   77 year old male Weight:      190 pounds Temp:     98.0 degrees F oral Pulse rate:   60 / minute Pulse rhythm:   regular BP sitting:   120 / 68  (left arm) Cuff size:   regular  Vitals Entered By: Sydell Axon LPN (June 06, 2009 9:43 AM) CC: 6 Week follow-up, wants to discuss Omeprazole   History of Present Illness: Pt here for followup of Htn and CP. He has learned that Prilosec was probably causing his problems...he has stopped the Omeprazole three weeks ago and is getting better. He wants something else to help with reflux. He would prefer staying away from Omeprazole-like (PPI)  meds.  He had been on Zantac a long time ago.  He has tolerated the Lisinopril without difficulty.   Problems Prior to Update: 1)  Abdominal Pain-multiple Sites  (ICD-789.09) 2)  Barrett's Esophagus  (ICD-530.85) 3)  Colonic Polyps, Hx of  (ICD-V12.72) 4)  Colonic Polyps  (ICD-211.3) 5)  Special Screening Malignant Neoplasm of Prostate  (ICD-V76.44) 6)  Chronic Rhinitis  (ICD-472.0) 7)  Shoulder Pain, Right  (ICD-719.41) 8)  Gilbert's Syndrome  (ICD-277.4) 9)  Hyperglycemia  (ICD-790.29) 10)  Hx of Hypercholesterolemia  (ICD-272.0) 11)  Hypertension  (ICD-401.9) 12)  Gerd  (ICD-530.81) 13)  Coronary Artery Disease  (ICD-414.00)  Medications Prior to Update: 1)  Metoprolol Succinate 50 Mg  Tb24 (Metoprolol Succinate) .... One Half Tablet By Mouth in Morning Only 2)  Nitrostat 0.4 Mg  Subl (Nitroglycerin) .... Take By Mouth As Directed Prn 3)  Simvastatin 80 Mg  Tabs (Simvastatin) .Marland Kitchen.. 1 Tab By Mouth At Night. 4)  Adult Aspirin Low Strength 81 Mg Tbdp (Aspirin) .... One A Day By Mouth 5)  Ranitidine Hcl 150 Mg Caps (Ranitidine Hcl) .... As Needed 6)  Multivitamins  Tabs (Multiple Vitamin) .... Once Daily By Mouth 7)  B-12 1000 Mcg Cr-Tabs (Cyanocobalamin) .... Once Daily By Mouth 8)  Vitamin D 1000 Unit Tabs (Cholecalciferol)  .... Once Daily By Mouth 9)  Calcium 500/d 500-200 Mg-Unit Tabs (Calcium Carbonate-Vitamin D) .... Once Daily By Mouth 10)  Omeprazole 40 Mg  Cpdr (Omeprazole) .Marland Kitchen.. 1 Each Day 30 Minutes Before Meal 11)  Lisinopril 5 Mg Tabs (Lisinopril) .... One Tab By Mouth At Night  Allergies: 1)  ! * Amoxicillin 2)  Lipitor 3)  Morphine 4)  * Crestor 5)  Zocor  Physical Exam  General:  Well developed, well nourished, no acute distress. Comfortable when seen. Head:  Normocephalic and atraumatic. Sinuses NT. Eyes:  PERRLA, no icterus. Conjunctiva clear bilaterally.  Ears:  Normal auditory acuity. TMs B-9. Nose:  External nasal examination shows no deformity or inflammation. Nasal mucosa are pink and moist without lesions or exudates. Mouth:  No deformity or lesions, dentition normal. Neck:  Supple; no masses or thyromegaly. Lungs:  Normal respiratory effort, chest expands symmetrically. Lungs are clear to auscultation, no crackles or wheezes. Chest expansion causes no discomfort. Heart:  Regular rate and rhythm; no murmurs, rubs,  or bruits. Abdomen:  Bowel sounds positive,abdomen soft and non-tender without masses, organomegaly or hernias noted.   Impression & Recommendations:  Problem # 1:  GERD (ICD-530.81) Assessment Improved  Stable off Omeprazole with chest pain almost resolved since off the medication. Trial of Ranitidine. The following medications were removed from the medication list:    Omeprazole 40 Mg Cpdr (Omeprazole) .Marland Kitchen... 1 each  day 30 minutes before meal His updated medication list for this problem includes:    Ranitidine Hcl 150 Mg Caps (Ranitidine hcl) ..... One tab by mouth prior to brfst and supper.  Diagnostics Reviewed:  EGD: Location: [facility]   (11/15/2008) Discussed lifestyle modifications, diet, antacids/medications, and preventive measures. Handout provided.   Problem # 2:  HYPERTENSION (ICD-401.9) Assessment: Improved Cont current meds. Will  follow. His updated medication list for this problem includes:    Metoprolol Succinate 50 Mg Tb24 (Metoprolol succinate) ..... One half tablet by mouth in morning only    Lisinopril 5 Mg Tabs (Lisinopril) ..... One tab by mouth at night  BP today: 120/68 Prior BP: 160/90 (04/24/2009)  Labs Reviewed: K+: 4.2 (05/16/2009) Creat: : 0.8 (05/16/2009)   Chol: 120 (08/30/2008)   HDL: 30.6 (08/30/2008)   LDL: 73 (08/30/2008)   TG: 83 (08/30/2008)  Complete Medication List: 1)  Metoprolol Succinate 50 Mg Tb24 (Metoprolol succinate) .... One half tablet by mouth in morning only 2)  Nitrostat 0.4 Mg Subl (Nitroglycerin) .... Take by mouth as directed as needed 3)  Simvastatin 80 Mg Tabs (Simvastatin) .Marland Kitchen.. 1 tab by mouth at night. 4)  Adult Aspirin Low Strength 81 Mg Tbdp (Aspirin) .... One a day by mouth 5)  Ranitidine Hcl 150 Mg Caps (Ranitidine hcl) .... One tab by mouth prior to brfst and supper. 6)  Multivitamins Tabs (Multiple vitamin) .... Once daily by mouth 7)  B-12 1000 Mcg Cr-tabs (Cyanocobalamin) .... Once daily by mouth 8)  Vitamin D 1000 Unit Tabs (Cholecalciferol) .... Once daily by mouth 9)  Calcium 500/d 500-200 Mg-unit Tabs (Calcium carbonate-vitamin d) .... Once daily by mouth 10)  Lisinopril 5 Mg Tabs (Lisinopril) .... One tab by mouth at night  Patient Instructions: 1)  RTC for 4 mos for Comp Exam, labs prior Prescriptions: RANITIDINE HCL 150 MG CAPS (RANITIDINE HCL) one tab by mouth prior to brfst and supper.  #60 x 12   Entered and Authorized by:   Shaune Leeks MD   Signed by:   Shaune Leeks MD on 06/06/2009   Method used:   Electronically to        [facility] ([facility]       [address]       [place], [place]  [address]       Ph: [phone]       Fax: [phone]   RxID:   [id number]   Current Allergies (reviewed today): ! * AMOXICILLIN LIPITOR MORPHINE * CRESTOR ZOCOR

## 2010-10-08 NOTE — Letter (Signed)
Summary: Patient Notice-[facility]  [address] [place], [place] [address]   Phone: [phone]  Fax: [phone]        November 17, 2008 MRN: [id number]    Barry Small [address] [place], [place]  [address]    Dear [name],  I am pleased to inform you that the biopsies taken during your recent endoscopic examination did not show any evidence of cancer upon pathologic examination.  However, your biopsies indicate you have a condition known as Barrett's esophagus. While not cancer, it is pre-cancerous (can progress to cancer) and needs to be monitored with repeat endoscopic examination and biopsies.  Fortunately, it is quite rare that this develops into cancer, but careful monitoring of the condition along with taking your medication as prescribed is important in reducing the risk of developing cancer.  It is my recommendation that you have a repeat upper gastrointestinal endoscopic examination in 2 years.  Continue with treatment plan as outlined the day of your exam.  Please call us if you have or develop heartburn, reflux symptoms, any swallowing problems, or if you have questions about your condition that have not been fully answered at this time.  Sincerely,  Meryl Dare MD [facility]  This letter has been electronically signed by your physician.

## 2010-10-08 NOTE — Assessment & Plan Note (Signed)
Summary: ESTABL FROM [facility]O   Vital Signs:  Patient profile:   77 year old male Height:      67 inches Weight:      185.25 pounds BMI:     29.12 Temp:     98.1 degrees F oral Pulse rate:   76 / minute Pulse rhythm:   regular BP sitting:   142 / 80  (left arm) Cuff size:   regular  Vitals Entered By: Delilah Shan CMA Jahi Roza Dull) (July 29, 2010 11:13 AM) CC: Establish from Dr. Hetty Ely   History of Present Illness: GERD and h/o barrett's esophagus.  Needs rx for PPI.  Doing well on meds.   H/o rectal puritis from hemorrhoid.  Needs cream for external symptoms.   CAD.  No CP.  Able to exercise/walk w/o CP.  No edema.  No SOB.  Tolerating crestor w/o myalgias.  No NTG use.  Has had follow up with cards.    Allergies: 1)  ! * Amoxicillin 2)  Lipitor 3)  Morphine 4)  * Crestor 5)  Zocor  Past History:  Past Medical History: Last updated: 11/01/2008 Coronary artery disease GERD Barrett's esophagus Colon polyps, type unknown, 04/2003 Hypertension Elevated glucose Elevated bilirubin Hyperlipidemia  Past Surgical History: CABG ([facility] ) 1989 PTCA x2 (Dr Pulsipher) 2001 Choleycystectomy 09/2001 Colonoscopy, polypectomy x multiple (Dr Mechele Collin) (05/09/2003) EGD- Barrett's (05/09/2003) Cardiolite- h/o vessel infarct EF 66% (12/2001) Stress myoview- normal EF 70% (09/30/2004) Coronary artery bypass graft PTCA/stent (12/05/2005) Chest pain- [facility],  PTCA x 1 (03/28-03/31/2007) Stress myoview- no change, c/w 09/2004 (02/11/2006) Stress Myoview Abnml w/prior inf infarct No signif ischemia (02/22/2008) Nasal Surgery (Dr Chestine Spore) 06/23/2008 Colonoscopy Mild Divertics (Dr Russella Dar) 11/15/08           5 yrs. EGD Barrett's Esoph Erosive Esoph HH (Dr Russella Dar) 11/17/08    2 yrs.  Family History: Reviewed history from 05/18/2007 and no changes required. Father: died age 32, possibly of heart problems Mother: died age 19- pneumonia Siblings: none  Social History: Reviewed history from  11/01/2008 and no changes required. Marital Status: Married, 1969 Children: 2 Occupation: retired Engineer, structural Alcohol Use - yes -2 on Monday nights, occ on other nights Daily Caffeine Use -1 Minimal exericse Illicit Drug Use - no working at [employer] part time  Review of Systems       See HPI.  Otherwise negative.    Physical Exam  General:  GEN: nad, alert and oriented HEENT: mucous membranes moist NECK: supple w/o LA CV: rrr. PULM: ctab, no inc wob ABD: soft, +bs EXT: no edema SKIN: no acute rash  ext hemorrhoid noted, no gross blood   Impression & Recommendations:  Problem # 1:  UNSPEC HEMORRHOIDS WITHOUT MENTION COMPLICATION (ICD-455.6)  Use topical tx and follow up as needed.   Orders: Prescription Created Electronically [phone])  Problem # 2:  BARRETT'S ESOPHAGUS (ICD-530.85) Cont PPI.   Problem # 3:  OLD MYOCARDIAL INFARCTION (ICD-412) No CP and doing well.  No change in meds.  His updated medication list for this problem includes:    Metoprolol Tartrate 50 Mg Tabs (Metoprolol tartrate) .Marland Kitchen... Take 1/2 tablet by mouth twice a day    Nitrostat 0.4 Mg Subl (Nitroglycerin) .Marland Kitchen... Take by mouth as directed as needed    Adult Aspirin Low Strength 81 Mg Tbdp (Aspirin) .Marland Kitchen... Take 1 tablet by mouth two times a day    Lisinopril 5 Mg Tabs (Lisinopril) ..... One tab by mouth at night  Complete Medication List: 1)  Metoprolol Tartrate 50 Mg Tabs (Metoprolol tartrate) .... Take 1/2 tablet by mouth twice a day 2)  Nitrostat 0.4 Mg Subl (Nitroglycerin) .... Take by mouth as directed as needed 3)  Adult Aspirin Low Strength 81 Mg Tbdp (Aspirin) .... Take 1 tablet by mouth two times a day 4)  Multivitamins Tabs (Multiple vitamin) .... Once daily by mouth 5)  B-12 1000 Mcg Cr-tabs (Cyanocobalamin) .... Once daily by mouth 6)  Vitamin D 1000 Unit Tabs (Cholecalciferol) .... 2,000 units per day 7)  Calcium 500/d 500-200 Mg-unit Tabs (Calcium carbonate-vitamin d) .... Once daily by  mouth 8)  Lisinopril 5 Mg Tabs (Lisinopril) .... One tab by mouth at night 9)  Omeprazole 40 Mg Cpdr (Omeprazole) .... Take one by mouth 30 minutes prior to meal as needed 10)  Anusol-hc 25 Mg Supp (Hydrocortisone acetate) .... One supp per rectum three times a day for one week then as needed. 11)  Crestor 20 Mg Tabs (Rosuvastatin calcium) .... Take 1/2-1 tablet by mouth daily. 12)  Proctosol Hc 2.5 % Crea (Hydrocortisone) .... 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  I'll fill out as much as I can and I'll let you know what else I need.  2)  Please come in for a check up in 11/2010- appointment.  Labs ahead of time (cmet/lipid/cbc/tsh 412.0, psa v76.44) Prescriptions: OMEPRAZOLE 40 MG CPDR (OMEPRAZOLE) Take one by mouth 30 minutes prior to meal as needed  #30 x 12   Entered and Authorized by:   Crawford Givens MD   Signed by:   Crawford Givens MD on 07/29/2010   Method used:   Electronically to        [facility] ([facility]       [address]       [place], [place]  [address]       Ph: [phone]       Fax: [phone]   RxID:   [id number] PROCTOSOL HC 2.5 % CREA (HYDROCORTISONE) AAA two to four times a day as needed for itching  #30g x 1   Entered and Authorized by:   Crawford Givens MD   Signed by:   Crawford Givens MD on 07/29/2010   Method used:   Electronically to        [facility] ([facility]       [address]       [place], [place]  [address]       Ph: [phone]       Fax: [phone]   RxID:   [id number]    Orders Added: 1)  Est. Patient Level IV [[address]] 2)  Prescription Created Electronically [[id number]]   Immunization History:  Influenza Immunization History:    Influenza:  historical (05/09/2010)   Immunization History:  Influenza Immunization History:    Influenza:  Historical  (05/09/2010)  Current Allergies (reviewed today): ! * AMOXICILLIN LIPITOR MORPHINE * CRESTOR ZOCOR

## 2010-10-08 NOTE — Assessment & Plan Note (Signed)
Summary: CHECK UP/CLE   Vital Signs:  Patient profile:   77 year old male Weight:      193.25 pounds Temp:     98.0 degrees F oral Pulse rate:   60 / minute Pulse rhythm:   regular BP sitting:   140 / 78  (left arm) Cuff size:   large  Vitals Entered By: Sydell Axon LPN (November 01, 2009 9:12 AM) CC: 30 Minute checkup, had a colonoscopy 03/10 by Dr. 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  A prior EGD has been done.    Preventive Screening-Counseling & Management  Alcohol-Tobacco     Alcohol drinks/day: <1     Alcohol type: Beer, Margharitas     Smoking Status: never     Passive Smoke Exposure: no  Caffeine-Diet-Exercise     Caffeine use/day: 0     Does Patient Exercise: no  Problems Prior to Update: 1)  Old Myocardial Infarction  (ICD-412) 2)  Abdominal Pain-multiple Sites  (ICD-789.09) 3)  Barrett's Esophagus  (ICD-530.85) 4)  Colonic Polyps, Hx of  (ICD-V12.72) 5)  Colonic Polyps  (ICD-211.3) 6)  Special Screening Malignant Neoplasm of Prostate  (ICD-V76.44) 7)  Chronic Rhinitis  (ICD-472.0) 8)  Shoulder Pain, Right  (ICD-719.41) 9)  Gilbert's Syndrome  (ICD-277.4) 10)  Hyperglycemia  (ICD-790.29) 11)  Hx of Hypercholesterolemia  (ICD-272.0) 12)  Hypertension  (ICD-401.9) 13)  Gerd  (ICD-530.81) 14)  Coronary Artery Disease  (ICD-414.00)  Medications Prior to Update: 1)  Metoprolol Tartrate 50 Mg Tabs (Metoprolol Tartrate) .... Take 1/2 Tablet By Mouth Twice A Day 2)  Nitrostat 0.4 Mg  Subl (Nitroglycerin) .... Take By Mouth As Directed As Needed 3)  Simvastatin 80 Mg  Tabs (Simvastatin) .Marland Kitchen.. 1 Tab By Mouth At Night. 4)  Adult Aspirin Low Strength 81 Mg Tbdp (Aspirin) .... One A Day By Mouth 5)  Ranitidine Hcl 150 Mg Caps (Ranitidine Hcl) ....  One Tab By Mouth Prior To Brfst and Supper. 6)  Multivitamins  Tabs (Multiple Vitamin) .... Once Daily By Mouth 7)  B-12 1000 Mcg Cr-Tabs (Cyanocobalamin) .... Once Daily By Mouth 8)  Vitamin D 1000 Unit Tabs (Cholecalciferol) .... Once Daily By Mouth 9)  Calcium 500/d 500-200 Mg-Unit Tabs (Calcium Carbonate-Vitamin D) .... Once Daily By Mouth 10)  Lisinopril 5 Mg Tabs (Lisinopril) .... One Tab By Mouth At Night  Allergies: 1)  ! * Amoxicillin 2)  Lipitor 3)  Morphine 4)  * Crestor 5)  Zocor  Review of Systems General:  Denies chills, fatigue, fever, sweats, weakness, and weight loss. Eyes:  Denies blurring, discharge, and eye pain. ENT:  Complains of decreased hearing; denies ear discharge, earache, and ringing in ears; mild. CV:  Denies chest pain or discomfort, fainting, fatigue, palpitations, shortness of breath with exertion, swelling of feet, and swelling of hands. Resp:  Complains of cough; denies shortness of breath and wheezing; mild from ACEI...doesn't want to change.Marland Kitchen GI:  Denies abdominal pain, bloody stools, change in bowel habits, constipation, dark tarry stools, diarrhea, indigestion, loss of appetite, nausea, vomiting, vomiting blood, and yellowish skin color. GU:  Denies discharge, dysuria, nocturia, and urinary frequency. MS:  Denies joint pain, joint swelling, low back pain, muscle aches, cramps, and stiffness. Derm:  Denies dryness, itching, and rash. Neuro:  Denies numbness, poor balance, tingling, and tremors.  Physical Exam  General:  Well-developed,well-nourished,in no acute distress; alert,appropriate and cooperative throughout examination Head:  Normocephalic and atraumatic without obvious abnormalities. No apparent alopecia but male pattern  balding. Eyes:  PERRLA, no icterus. Conjunctiva clear bilaterally.  Ears:  Normal auditory acuity. TMs B-9. Nose:  External nasal examination shows no deformity or inflammation. Nasal mucosa are pink and moist without  lesions or exudates. Mouth:  No deformity or lesions, dentition normal. Neck:  Supple; no masses or thyromegaly. Chest Wall:  No deformities, masses, tenderness or gynecomastia noted. Breasts:  No masses or gynecomastia noted Lungs:  Normal respiratory effort, chest expands symmetrically. Lungs are clear to auscultation, no crackles or wheezes. Chest expansion causes no discomfort. Heart:  Regular rate and rhythm; no murmurs, rubs,  or bruits. Abdomen:  Bowel sounds positive,abdomen soft and non-tender without masses, organomegaly or hernias noted. Rectal:  No external abnormalities noted. Normal sphincter tone. No rectal masses or tenderness. Mild ext hemms deflated. G neg. Genitalia:  Testes bilaterally descended without nodularity, tenderness or masses. No scrotal masses or lesions. No penis lesions or urethral discharge. Prostate:  Prostate gland firm and smooth, no enlargement, nodularity, tenderness, mass, asymmetry or induration. 30-40gms. Msk:  No deformity or scoliosis noted of thoracic or lumbar spine.   Pulses:  R and L carotid,radial,femoral,dorsalis pedis and posterior tibial pulses are full and equal bilaterally Extremities:  No clubbing, cyanosis, edema, or deformity noted with normal full range of motion of all joints.   Neurologic:  No cranial nerve deficits noted. Station and gait are normal. Sensory, motor and coordinative functions appear intact. Skin:  Intact without suspicious lesions or rashes Cervical Nodes:  No lymphadenopathy noted Inguinal Nodes:  No significant adenopathy Psych:  Cognition and judgment appear intact. Alert and cooperative with normal attention span and concentration. No apparent delusions, illusions, hallucinations   Impression & Recommendations:  Problem # 1:  PRURITUS ANI (ICD-698.0) Assessment New Discussed washin regularly after BMs.  Problem # 2:  UNSPEC HEMORRHOIDS WITHOUT MENTION COMPLICATION (ICD-455.6) Assessment: Deteriorated  Start  Anusol HC supp prior to driving. Discussed further prophylaxis.  Orders: Prescription Created Electronically [phone])  Problem # 3:  SPECIAL SCREENING MALIGNANT NEOPLASM OF PROSTATE (ICD-V76.44) Assessment: Unchanged Stable PSA and exam.  Problem # 4:  GILBERT'S SYNDROME (ICD-277.4) Assessment: Improved Nos stable today.  Problem # 5:  HYPERGLYCEMIA (ICD-790.29) Assessment: Unchanged Conts. Encouraged to decrease sweets and carbs. Labs Reviewed: Creat: 0.9 (10/29/2009)     Problem # 6:  Hx of HYPERCHOLESTEROLEMIA (ICD-272.0) Assessment: Unchanged Well controlled on curr meds. His updated medication list for this problem includes:    Simvastatin 80 Mg Tabs (Simvastatin) .Marland Kitchen... 1 tab by mouth at night.  Labs Reviewed: SGOT: 21 (10/29/2009)   SGPT: 21 (10/29/2009)   HDL:39.40 (10/29/2009), 30.6 (08/30/2008)  LDL:69 (10/29/2009), 73 (08/30/2008)  Chol:126 (10/29/2009), 120 (08/30/2008)  Trig:88.0 (10/29/2009), 83 (08/30/2008)  Problem # 7:  HYPERTENSION (ICD-401.9) Assessment: Improved Nos at home nml. Cont to follow. Tolerating meds ok. His updated medication list for this problem includes:    Metoprolol Tartrate 50 Mg Tabs (Metoprolol tartrate) .Marland Kitchen... Take 1/2 tablet by mouth twice a day    Lisinopril 5 Mg Tabs (Lisinopril) ..... One tab by mouth at night  BP today: 140/78 Prior BP: 157/84 (06/20/2009)  Labs Reviewed: K+: 4.4 (10/29/2009) Creat: : 0.9 (10/29/2009)   Chol: 126 (10/29/2009)   HDL: 39.40 (10/29/2009)   LDL: 69 (10/29/2009)  TG: 88.0 (10/29/2009)  Problem # 8:  GERD (ICD-530.81) Assessment: Improved Better on Omeprazole. Cont. The following medications were removed from the medication list:    Ranitidine Hcl 150 Mg Caps (Ranitidine hcl) ..... One tab by mouth prior to brfst and supper. His updated medication list for this problem includes:    Omeprazole 40 Mg Cpdr (Omeprazole) .Marland Kitchen... Take one by mouth 30 minutes prior to meal  Diagnostics Reviewed:  EGD:  Location: [facility]   (11/15/2008) Discussed lifestyle modifications, diet, antacids/medications, and preventive measures. Handout provided.   Complete Medication List: 1)  Metoprolol Tartrate 50 Mg Tabs (Metoprolol tartrate) .... Take 1/2 tablet by mouth twice a day 2)  Nitrostat 0.4 Mg Subl (Nitroglycerin) .... Take by mouth as directed as needed 3)  Simvastatin 80 Mg Tabs (Simvastatin) .Marland Kitchen.. 1 tab by mouth at night. 4)  Adult Aspirin Low Strength 81 Mg Tbdp (Aspirin) .... One a day by mouth 5)  Multivitamins Tabs (Multiple vitamin) .... Once daily by mouth 6)  B-12 1000 Mcg Cr-tabs (Cyanocobalamin) .... Once daily by mouth 7)  Vitamin D 1000 Unit Tabs (Cholecalciferol) .... Once daily by mouth 8)  Calcium 500/d 500-200 Mg-unit Tabs (Calcium carbonate-vitamin d) .... Once daily by mouth 9)  Lisinopril 5 Mg Tabs (Lisinopril) .... One tab by mouth at night 10)  Omeprazole 40 Mg Cpdr (Omeprazole) .... Take one by mouth 30 minutes prior to meal 11)  Anusol-hc 25 Mg Supp (Hydrocortisone acetate) .... One supp per rectum three times a day for one week then as needed.   Patient Instructions: 1)  RTC one year, sooner as needed. Prescriptions: LISINOPRIL 5 MG TABS (LISINOPRIL) one tab by mouth at night  #90 x 3   Entered and Authorized by:   Shaune Leeks MD   Signed by:   Shaune Leeks MD on 11/01/2009   Method used:   Print then Give to Patient   RxID:   [id number] METOPROLOL TARTRATE 50 MG TABS (METOPROLOL TARTRATE) Take 1/2 tablet by mouth twice a day  #30 x 3   Entered and Authorized by:   Shaune Leeks MD   Signed by:   Shaune Leeks MD on 11/01/2009   Method used:   Print then Give to Patient   RxID:   [id number] ANUSOL-HC 25 MG SUPP (HYDROCORTISONE ACETATE) one supp per rectum three times a day for one week then as needed.  #30 x 12   Entered and Authorized by:   Shaune Leeks MD   Signed by:   Shaune Leeks MD on  11/01/2009   Method used:   [facility]        [facility]       [address]       [place], [place]  [address]       Ph: [phone]       Fax: [phone]   RxID:   [id number]   Current Allergies (reviewed today): ! * AMOXICILLIN LIPITOR MORPHINE * CRESTOR ZOCOR

## 2010-11-05 ENCOUNTER — Encounter: Payer: Self-pay | Admitting: Cardiovascular Disease

## 2010-11-05 ENCOUNTER — Ambulatory Visit (INDEPENDENT_AMBULATORY_CARE_PROVIDER_SITE_OTHER): Payer: Medicare Other | Admitting: Cardiovascular Disease

## 2010-11-05 DIAGNOSIS — I10 Essential (primary) hypertension: Secondary | ICD-10-CM

## 2010-11-05 DIAGNOSIS — E785 Hyperlipidemia, unspecified: Secondary | ICD-10-CM

## 2010-11-05 DIAGNOSIS — I2510 Atherosclerotic heart disease of native coronary artery without angina pectoris: Secondary | ICD-10-CM

## 2010-11-14 NOTE — Assessment & Plan Note (Signed)
Summary: F6M/AMD  Medications Added METOPROLOL TARTRATE 50 MG TABS (METOPROLOL TARTRATE) Take 1/2 tablet by mouth once daily OMEPRAZOLE 20 MG CPDR (OMEPRAZOLE) one tablet once daily as needed      Allergies Added:   Visit Type:  Follow-up Referring Provider:  Maisie Fus Wall,M.D. Primary Provider:  Laurita Quint, MD  CC:  c/o cough & nasal congestion; taking mucinex D..  History of Present Illness: Barry Small is a pleasant 77 year old gentleman with a history of coronary artery disease, 4 vessel bypass in 1989 at [facility], history of stenting in 2007 after presenting to [facility] with epigastric and chest discomfort, presenting for routine followup.  Since his last clinic visit, he has had no significant symptoms of  chest pain. He is active, does regular walking. Typically plays golf when the weather is better.   he recently recovered from a gastrointestinal virus and then developed bronchitis though slowly improving. Continues to have cough with mild sputum production though it is improving.  His last stress nuclear study was in June of 2009. EF was normal with an old inferior wall infarct scar. There was no ischemia.  EKG shows normal sinus rhythm with rate 64 beats per minute with first degree AV block, possible old inferior infarct  Current Medications (verified): 1)  Metoprolol Tartrate 50 Mg Tabs (Metoprolol Tartrate) .... Take 1/2 Tablet By Mouth Once Daily 2)  Nitrostat 0.4 Mg  Subl (Nitroglycerin) .... Take By Mouth As Directed As Needed 3)  Adult Aspirin Low Strength 81 Mg Tbdp (Aspirin) .... Take 1 Tablet By Mouth Two Times A Day 4)  Multivitamins  Tabs (Multiple Vitamin) .... Once Daily By Mouth 5)  B-12 1000 Mcg Cr-Tabs (Cyanocobalamin) .... Once Daily By Mouth 6)  Vitamin D 1000 Unit Tabs (Cholecalciferol) .... 2,000 Units Per Day 7)  Calcium 500/d 500-200 Mg-Unit Tabs (Calcium Carbonate-Vitamin D) .... Once Daily By Mouth 8)  Lisinopril 5 Mg Tabs (Lisinopril)  .... One Tab By Mouth At Night 9)  Omeprazole 20 Mg Cpdr (Omeprazole) .... One Tablet Once Daily As Needed 10)  Anusol-Hc 25 Mg Supp (Hydrocortisone Acetate) .... One Supp Per Rectum Three Times A Day For One Week Then As Needed. 11)  Crestor 20 Mg Tabs (Rosuvastatin Calcium) .... Take 1/2-1 Tablet By Mouth Daily. 12)  Proctosol Hc 2.5 % Crea (Hydrocortisone) .... Aaa Two To Four Times A Day As Needed For Itching  Allergies (verified): 1)  ! * Amoxicillin 2)  Lipitor 3)  Morphine 4)  * Crestor 5)  Zocor  Past History:  Past Medical History: Last updated: 11/01/2008 Coronary artery disease GERD Barrett's esophagus Colon polyps, type unknown, 04/2003 Hypertension Elevated glucose Elevated bilirubin Hyperlipidemia  Past Surgical History: Last updated: 2010/08/14 CABG ([facility] ) 1989 PTCA x2 (Dr Pulsipher) 2001 Choleycystectomy 09/2001 Colonoscopy, polypectomy x multiple (Dr Mechele Collin) (05/09/2003) EGD- Barrett's (05/09/2003) Cardiolite- h/o vessel infarct EF 66% (12/2001) Stress myoview- normal EF 70% (09/30/2004) Coronary artery bypass graft PTCA/stent (12/05/2005) Chest pain- [facility],  PTCA x 1 (03/28-03/31/2007) Stress myoview- no change, c/w 09/2004 (02/11/2006) Stress Myoview Abnml w/prior inf infarct No signif ischemia (02/22/2008) Nasal Surgery (Dr Chestine Spore) 06/23/2008 Colonoscopy Mild Divertics (Dr Russella Dar) 11/15/08           5 yrs. EGD Barrett's Esoph Erosive Esoph HH (Dr Russella Dar) 11/17/08    2 yrs.  Family History: Last updated: 2010/08/14 Father: died age 81, possibly of heart problems Mother: died age 61- pneumonia Siblings: none  Social History: Last updated: 08/14/10 Marital Status: Married, 1969 Children:  2 Occupation: retired Engineer, structural Alcohol Use - yes -2 on Monday nights, occ on other nights Daily Caffeine Use -1 Minimal exericse Illicit Drug Use - no working at [employer] part time  Risk Factors: Alcohol Use: <1 (11/01/2009) Caffeine Use: 0  (11/01/2009) Exercise: no (11/01/2009)  Risk Factors: Smoking Status: never (11/01/2009) Passive Smoke Exposure: no (11/01/2009)  Review of Systems  The patient denies fever, weight loss, weight gain, vision loss, decreased hearing, hoarseness, chest pain, syncope, dyspnea on exertion, peripheral edema, prolonged cough, abdominal pain, incontinence, muscle weakness, depression, and enlarged lymph nodes.         mild cough with scant sputum production  Vital Signs:  Patient profile:   77 year old male Height:      67 inches Weight:      188 pounds BMI:     29.55 Pulse rate:   64 / minute BP sitting:   162 / 85  (left arm) Cuff size:   regular  Vitals Entered By: Bishop Dublin, CMA (November 05, 2010 11:04 AM)  Physical Exam  General:  Well developed, well nourished, in no acute distress. Head:  normocephalic and atraumatic Neck:  Neck supple, no JVD. No masses, thyromegaly or abnormal cervical nodes. Lungs:  Clear bilaterally to auscultation and percussion. Heart:  Non-displaced PMI, chest non-tender; regular rate and rhythm, S1, S2 without murmurs, rubs or gallops. Carotid upstroke normal, no bruit. Normal abdominal aortic size, no bruits.  Pedals normal pulses. No edema, no varicosities. Abdomen:  Bowel sounds positive; abdomen soft and non-tender without masses Msk:  Back normal, normal gait. Muscle strength and tone normal. Pulses:  pulses normal in all 4 extremities Extremities:  No clubbing or cyanosis. Neurologic:  Alert and oriented x 3. Skin:  Intact without lesions or rashes. Psych:  Normal affect.   Impression & Recommendations:  Problem # 1:  CORONARY ARTERY DISEASE (ICD-414.00) no current symptoms of angina. Stress test last in 2009. No further testing ordered at this time. Continued aggressive medical management.  His updated medication list for this problem includes:    Metoprolol Tartrate 50 Mg Tabs (Metoprolol tartrate) .Marland Kitchen... Take 1/2 tablet by mouth  once daily    Nitrostat 0.4 Mg Subl (Nitroglycerin) .Marland Kitchen... Take by mouth as directed as needed    Adult Aspirin Low Strength 81 Mg Tbdp (Aspirin) .Marland Kitchen... Take 1 tablet by mouth two times a day    Lisinopril 5 Mg Tabs (Lisinopril) ..... One tab by mouth at night  Problem # 2:  HYPERTENSION (ICD-401.9) Blood pressure is elevated today he reports he is on significant cold medicine for his cough and has been taking salt to clear his throat. We have suggested he closely monitor his blood pressure at home and contact us if it continues to be elevated.  His updated medication list for this problem includes:    Metoprolol Tartrate 50 Mg Tabs (Metoprolol tartrate) .Marland Kitchen... Take 1/2 tablet by mouth once daily    Adult Aspirin Low Strength 81 Mg Tbdp (Aspirin) .Marland Kitchen... Take 1 tablet by mouth two times a day    Lisinopril 5 Mg Tabs (Lisinopril) ..... One tab by mouth at night  Problem # 3:  Hx of HYPERCHOLESTEROLEMIA (ICD-272.0) Cholesterol is at goal LDL less than 70. Continue current dose of Crestor.  His updated medication list for this problem includes:    Crestor 20 Mg Tabs (Rosuvastatin calcium) .Marland Kitchen... Take 1/2-1 tablet by mouth daily.  Patient Instructions: 1)  Your physician recommends that you schedule a follow-up  appointment in: 6 months 2)  Your physician recommends that you continue on your current medications as directed. Please refer to the Current Medication list given to you today.

## 2010-11-28 ENCOUNTER — Encounter: Payer: Self-pay | Admitting: Gastroenterology

## 2010-12-05 NOTE — Letter (Signed)
Summary: Endoscopy Letter  [facility]  [address] [place], [place] [address]   Phone: [phone]  Fax: [phone]      November 28, 2010 MRN: [id number]   Barry Small [address] [place], [place]  [address]   Dear Barry Small,   According to your medical record, it is time for you to schedule an Endoscopy. Endoscopic screening is recommended for patients with certain upper digestive tract conditions because of associated increased risk for cancers of the upper digestive system.  This letter has been generated based on the recommendations made at the time of your prior procedure. If you feel that in your particular situation this may no longer apply, please contact our office.  Please call our office at [phone]) to schedule this appointment or to update your records at your earliest convenience.  Thank you for cooperating with [place] to provide you with the very best care possible.   Sincerely,  Makhya Arave T. Russella Dar, M.D.  [facility] [phone]

## 2011-01-02 ENCOUNTER — Other Ambulatory Visit: Payer: Self-pay | Admitting: *Deleted

## 2011-01-02 MED ORDER — HYDROCORTISONE ACETATE 25 MG RE SUPP: 25.0000 mg | Freq: Three times a day (TID) | RECTAL | Status: AC

## 2011-01-15 ENCOUNTER — Ambulatory Visit (INDEPENDENT_AMBULATORY_CARE_PROVIDER_SITE_OTHER): Payer: Medicare Other | Admitting: Cardiovascular Disease

## 2011-01-15 ENCOUNTER — Encounter: Payer: Self-pay | Admitting: Family Medicine

## 2011-01-15 ENCOUNTER — Encounter: Payer: Self-pay | Admitting: Cardiovascular Disease

## 2011-01-15 DIAGNOSIS — Z951 Presence of aortocoronary bypass graft: Secondary | ICD-10-CM

## 2011-01-15 DIAGNOSIS — I2510 Atherosclerotic heart disease of native coronary artery without angina pectoris: Secondary | ICD-10-CM

## 2011-01-15 DIAGNOSIS — E7800 Pure hypercholesterolemia, unspecified: Secondary | ICD-10-CM

## 2011-01-15 DIAGNOSIS — R0602 Shortness of breath: Secondary | ICD-10-CM

## 2011-01-15 DIAGNOSIS — I252 Old myocardial infarction: Secondary | ICD-10-CM

## 2011-01-15 DIAGNOSIS — Z9889 Other specified postprocedural states: Secondary | ICD-10-CM

## 2011-01-15 DIAGNOSIS — R079 Chest pain, unspecified: Secondary | ICD-10-CM

## 2011-01-15 DIAGNOSIS — R011 Cardiac murmur, unspecified: Secondary | ICD-10-CM

## 2011-01-15 DIAGNOSIS — I10 Essential (primary) hypertension: Secondary | ICD-10-CM

## 2011-01-15 MED ORDER — NITROGLYCERIN 0.4 MG SL SUBL: 0.4000 mg | SUBLINGUAL_TABLET | SUBLINGUAL | Status: DC | PRN

## 2011-01-15 NOTE — Patient Instructions (Addendum)
You are doing well. No medication changes were made. Your physician has requested that you have an echocardiogram. Echocardiography is a painless test that uses sound waves to create images of your heart. It provides your doctor with information about the size and shape of your heart and how well your heart's chambers and valves are working. This procedure takes approximately one hour. There are no restrictions for this procedure.  Please call us if you have new issues that need to be addressed before your next appt.  We will call you for a follow up Appt. In 6 months

## 2011-01-15 NOTE — Assessment & Plan Note (Signed)
Cholesterol is at goal on the current lipid regimen. No changes to the medications were made.  

## 2011-01-15 NOTE — Assessment & Plan Note (Signed)
Blood pressure is well controlled on today's visit. No changes made to the medications. 

## 2011-01-15 NOTE — Progress Notes (Signed)
   Patient ID: Barry Small, male    DOB: Mar 14, 1934, 77 y.o.   MRN: [id number]  HPI Comments: Barry Small is a pleasant 77 year old gentleman with a history of coronary artery disease, 4 vessel bypass in 1989 at [facility], history of stenting in 2007 after presenting to [facility] with epigastric and chest discomfort, presented for routine followup.   Overall he states that he is doing well. He is active, plays golf, does a lot of gardening and has no symptoms of angina or epigastric discomfort. No significant lower extremity edema.   He has had some very atypical type chest discomfort, shortness of breath, increased cough. He plans on having lots of trips this summer and wanted to come in early to make sure that he was okay. He is otherwise very active. He denies any significant lower extremity edema, diaphoresis.  His last stress nuclear study was in June of 2009. EF was normal with an old inferior wall infarct scar. There was no ischemia.  EKG shows normal sinus rhythm with rate 69 beats per minute with no significant ST-T wave changes., possible old inferior infarct.        Review of Systems  Constitutional: Negative.   HENT: Negative.   Eyes: Negative.   Respiratory: Positive for cough and shortness of breath.   Cardiovascular: Positive for chest pain.  Gastrointestinal: Negative.   Musculoskeletal: Negative.   Skin: Negative.   Neurological: Negative.   Hematological: Negative.   Psychiatric/Behavioral: Negative.   All other systems reviewed and are negative.   BP 156/76  Pulse 69  Ht 5\' 8"  (1.727 m)  Wt 185 lb 12.8 oz (84.278 kg)  BMI 28.25 kg/m2   Physical Exam  Nursing note and vitals reviewed. Constitutional: He is oriented to person, place, and time. He appears well-developed and well-nourished.  HENT:  Head: Normocephalic.  Nose: Nose normal.  Mouth/Throat: Oropharynx is clear and moist.  Eyes: Conjunctivae are normal. Pupils are equal, round, and  reactive to light.  Neck: Normal range of motion. Neck supple. No JVD present.  Cardiovascular: Normal rate, regular rhythm, S1 normal, S2 normal and intact distal pulses.  Exam reveals no gallop and no friction rub.   Murmur heard.  Crescendo systolic murmur is present with a grade of 2/6  Pulmonary/Chest: Effort normal and breath sounds normal. No respiratory distress. He has no wheezes. He has no rales. He exhibits no tenderness.  Abdominal: Soft. Bowel sounds are normal. He exhibits no distension. There is no tenderness.  Musculoskeletal: Normal range of motion. He exhibits no edema and no tenderness.  Lymphadenopathy:    He has no cervical adenopathy.  Neurological: He is alert and oriented to person, place, and time. Coordination normal.  Skin: Skin is warm and dry. No rash noted. No erythema.  Psychiatric: He has a normal mood and affect. His behavior is normal. Judgment and thought content normal.           Assessment and Plan

## 2011-01-15 NOTE — Assessment & Plan Note (Signed)
Atypical type chest pain. We have renewed his nitroglycerin and asked him to contact us if he has more symptoms. We could do a stress test at that time. Echocardiogram has been ordered for his breathing, cough, chest discomfort, murmur.

## 2011-01-16 ENCOUNTER — Telehealth: Payer: Self-pay | Admitting: *Deleted

## 2011-01-16 ENCOUNTER — Other Ambulatory Visit: Payer: Self-pay | Admitting: Family Medicine

## 2011-01-16 NOTE — Telephone Encounter (Signed)
This medication states 1/2 tablet twice daily.  Our records show 1/2 tablet daily.  Which is correct?

## 2011-01-17 NOTE — Telephone Encounter (Signed)
It should be 1/2 tab a day according to recent notes.  I sent in the rx.  Please verify with patient.

## 2011-01-17 NOTE — Telephone Encounter (Signed)
Done

## 2011-01-21 NOTE — Assessment & Plan Note (Signed)
[facility] OFFICE NOTE   ETHANIEL, GARFIELD                    MRN:          [id number]  DATE:12/29/2007                            DOB:          July 01, 1934    Mr. Laughter returns today for further management of his coronary artery  disease.  It has been over 2 years since he had 2 Taxus stents placed to  a vein graft to the right coronary artery.  His last stress Myoview was  February 11, 2006; it showed an EF of 65% with no ischemia.   He is having no symptoms of angina.  He does have some reflux symptoms,  particularly after he takes in orange juice.   He currently is on:  1. Multivitamin daily.  2. Lopressor 25 mg a day.  3. Atacand 8 mg a day.  4. Aspirin 325 mg a day.  5. Simvastatin 80 mg a day.  6. Calcium and vitamin D.  7. Glucosamine.   EXAMINATION:  He is in no acute distress.  His blood pressure is 154/82;  his pulse is 62 and regular.  Weight is 194.  HEENT:  Unchanged.  Carotid upstrokes were equal bilaterally without  bruits;  no JVD.  Thyroid is not enlarged.  Trachea is midline.  LUNGS:  Clear.  HEART:  Reveals a poorly appreciated PMI.  Normal S1 and S2.  ABDOMEN:  Protuberant, good bowel sounds.  No midline bruit.  EXTREMITIES:  No cyanosis, clubbing or edema.  Pulses are brisk.  NEUROLOGIC:  Exam is intact.   ELECTROCARDIOGRAM:  Shows sinus bradycardia with a first-degree AV block  (which is stable).  He has some ST-segment changes, with biphasic T-wave  inversions in III and aVF.  This is not substantially different.   I have had a nice chat with Mr. Caralee Ates.  We need to arrange for him to  have an exercise/rest stress Myoview off of Lopressor.  We will do this  in May or June, to suit his schedule.  Assuming this is negative, I will  see him back again in 6 months.   He would like to switch his Atacand to generic ACE inhibitor.  We will  change it to lisinopril 20 mg a day.  He  will check his blood pressures,  and let me know if has any kind of side effects, including a cough.     Thomas C. Daleen Squibb, MD, [facility]  Electronically Signed    TCW/MedQ  DD: 12/29/2007  DT: 12/29/2007  Job #: [id number]   cc:   Arta Silence, MD

## 2011-01-21 NOTE — Assessment & Plan Note (Signed)
[facility] OFFICE NOTE   REILY, ILIC                    MRN:          [id number]  DATE:07/14/2007                            DOB:          1934-05-07    Mr. Coggeshall returns today for further management of the following  issues:  1. Coronary artery disease. He is a year and a half out from two TAXUS      stents to a vein graft of the right coronary artery. Stress Myoview      on February 11, 2006 showed an EF of 65% with no ischemia. He is having      no symptoms of angina.  2. Dr. Hetty Ely switched his Vytorin to simvastatin 80 mg a day. He      has multiple questions and mostly if this is okay. I have assured      him that it is as long as he has blood work followed up. He is not      having any indigestion and is not taking a PPI.   His medications are unchanged since his last visit except he is no  longer on Plavix and he is now on simvastatin 80 mg a day instead of the  Vytorin. He is also cutting Atacand in half from 16 to 8 mg a day. His  blood pressure had gotten down into the 90s.   His blood pressure today is 116/76, pulse 55 and regular. Weight is 183.  HEENT: Normocephalic, atraumatic. PERRLA. Extra-ocular movements intact.  Sclerae are clear. Facial symmetry is normal.  Carotid upstrokes are  equal bilaterally without bruits. There is no JVD.  Thyroid is not  enlarged. Trachea is midline.  LUNGS:  Are clear.  HEART: Reveals a nondisplaced PMI. He has a normal S1, S2 without  gallop.  ABDOMEN: Protuberant with good bowel sounds.  EXTREMITIES: Reveals no cyanosis, clubbing or edema. Pulses are intact.  NEURO: Intact.   Electrocardiogram shows sinus rhythm with a sinus bradycardia with a  first-degree AV block which is unchanged. He has an old inferior wall  infarct pattern which is unchanged.   ASSESSMENT/PLAN:  Mr. Accomando is doing well. We have renewed his  prescriptions and okayed the  simvastatin. Will see him back in March of  2009. At that time, he will be scheduled for a stress Myoview.     Thomas C. Daleen Squibb, MD, [facility]  Electronically Signed    TCW/MedQ  DD: 07/14/2007  DT: 07/14/2007  Job #: [id number]   cc:   Arta Silence, MD

## 2011-01-21 NOTE — Assessment & Plan Note (Signed)
[facility] OFFICE NOTE   Barry Small, Barry Small                    MRN:          [id number]  DATE:06/28/2008                            DOB:          April 03, 1934    Barry Small comes in today for further management of coronary disease.  He has had no angina or ischemic symptoms.  His last stress Myoview was  February 22, 2008.  He exercised for 7 minutes, achieved a met level of 8.5,  present maximum heart rate of 107%, EF 65%, no ischemia with a prior  inferior wall infarct.   His biggest problem was switching from Atacand to lisinopril.  He says  drop in his blood pressure below 90 systolic.  He has now discontinued  it.  He is on Lopressor 25 b.i.d., simvastatin 80 nightly, aspirin 325 a  day.   He would like some Viagra or Cialis.   PHYSICAL EXAMINATION:  VITAL SIGNS:  His blood pressure today is  elevated 164/96.  He says at home runs around 130/80, his pulse is 54  and regular.  His weight is 190, down 4.  HEENT:  Normal.  Carotids upstrokes are equal bilaterally without  bruits, no JVD.  Thyroid is not enlarged.  Trachea is midline.  LUNGS:  Clear.  HEART:  Regular rate and rhythm.  No gallop.  ABDOMINAL:  Good bowel sounds.  EXTREMITIES:  No edema.  Pulses are intact.  NEURO:  Intact.   Barry Small is doing well.  I have made no changes to his medical  program.  We will see him back in 6 months.  I did gave him 5 pills as a  sample of Cialis.  He is to take no more than one per week.     Thomas C. Daleen Squibb, MD, [facility]  Electronically Signed    TCW/MedQ  DD: 06/28/2008  DT: 06/28/2008  Job #: [id number]

## 2011-01-23 ENCOUNTER — Other Ambulatory Visit: Payer: Self-pay | Admitting: Family Medicine

## 2011-01-23 DIAGNOSIS — I252 Old myocardial infarction: Secondary | ICD-10-CM

## 2011-01-23 DIAGNOSIS — Z125 Encounter for screening for malignant neoplasm of prostate: Secondary | ICD-10-CM

## 2011-01-24 NOTE — Assessment & Plan Note (Signed)
[facility]                              CARDIOLOGY OFFICE NOTE   JOHNLUKE, HAUGEN                    MRN:          [id number]  DATE:06/23/2006                            DOB:          1934-05-25    Mr. Gharibian comes in today for further management of his coronary artery  disease.  He is status post two Taxus stents to a vein graft of his right  coronary artery in March 2007.  He had a stress Myoview February 11, 2006, EF 65%  with no ischemia.  He exercised for 7-1/2 minutes with no ST segment  changes.   He is having no angina at present.   Meds are unchanged since last visit.  Please refer to maintenance medication  list.   VITAL SIGNS:  His blood pressure today is 140/70.  Pulse 70 and regular.  Weight 185.  NECK:  Carotid upstrokes are equal bilaterally without bruits.  No JVD.  Thyroid is not enlarged.  Trachea is midline.  LUNGS:  Clear.  HEART:  Reveals a regular rate and rhythm.  Normal S1, S2.  ABDOMINAL EXAM:  Soft with good bowel sounds.  EXTREMITIES:  Reveal no cyanosis, clubbing, or edema.  Pulses are brisk.  NEURO EXAM:  Intact.   Electrocardiogram shows normal sinus rhythm with inferior wall change which  actually are showing some resolution.   ASSESSMENT AND PLAN:  Mr. Hudspeth is doing well.  I have asked him to stay  on Plavix until he sees me in March.  At that time we will discontinue it.  We renewed any medicines he needed.       Thomas C. Daleen Squibb, MD, [facility]     TCW/MedQ  DD:  06/23/2006  DT:  06/24/2006  Job #:  [id number]   cc:   Arta Silence, MD

## 2011-01-24 NOTE — Cardiovascular Report (Signed)
NAME:  URIEL, DOWDING NO.:  [id number]   MEDICAL RECORD NO.:  [id number]          PATIENT TYPE:  INP   LOCATION:  2909                         FACILITY:  [facility]   PHYSICIAN:  Charlies Constable, M.D. [facility] DATE OF BIRTH:  October 02, 1933   DATE OF PROCEDURE:  12/05/2005  DATE OF DISCHARGE:  12/06/2005                              CARDIAC CATHETERIZATION   PROCEDURE:  Percutaneous coronary intervention.   CLINICAL HISTORY:  Mr. Betke is 77 years old and had bypass surgery in  1988. He subsequently had near stents placed in the proximal and distal  portion of the vein graft to the posterior descending branch of the right  coronary artery. He was admitted on March 28th with an acute inferior  acceleration infarction and had the graft opened by Dr. Riley Kill. There was  quite a bit of thrombus and Dr. Riley Kill used aspiration thrombectomy and  balloon angioplasty and then placed the patient on Integrilin with the hopes  of clearing up thrombus before more definitive treatment of residual  disease. He is brought back to today for re-look and consideration for  treatment of residual disease within the vein graft.   PROCEDURE:  The patient procedure was performed via the right femoral artery  using arterial sheath and 6-French preformed coronary catheter, arterial  sheath. The patient was on an Integrilin drip and was given additional  heparin to prolong his ACT greater than 200 seconds. We used a 6-French JR4  guiding catheter. We initially took pictures with a 4-French catheter and  recognizing the graft was open and was suitable for intervention, we changed  out to a 6-French system and used a 6-French JR4 guiding catheter with side  holes. We passed a Prowater across the lesion within the stent in the  proximal graft and across the lesion within the stent in the distal portion  of the graft, and then into the posterior descending branch. There was also  slight filling defect  right  near the anastomosis of the vein graft of the  posterior descending branch which we elected not to treat. We first direct  stented the lesion of the distal portion of the graft with  a 3.0 x 16-mm  Taxus stent deploying this with one inflation at 18 atmospheres by 30  seconds. We then post dilated with a 3.5 x 12-mm Quantum Maverick performing  one inflation to 16 atmospheres by 30 seconds. We then deployed a 3.5 x 20-  mm CYPHER stent within the in-stent restenotic lesion in the proximal  portion of the graft, deploying this with one inflation at 18 atmospheres.  We did not post dilate this lesion. Final diagnostic studies were performed  through the guiding catheter.   The right femoral artery was not suitable for closure with AngioSeal.   CONCLUSION:  1.  Successful percutaneous coronary intervention of a lesion, in-stent re-      stenotic lesion in the distal portion of the vein graft, the posterior      descending branch of the right coronary artery using a Taxus drug-      eluting stent with improvement  in sentinel narrowing from 80% to less      than 10%.  2.  Successful percutaneous coronary intervention of the in-stent re-      stenotic lesion in the proximal portion of the vein graft to the      posterior descending branch of the right coronary artery using a Taxus      drug-eluting stent with improvement in sentinel narrowing from 90% to      less than 10%.   DISPOSITION:  The patient returned to the postanesthesia care unit for  further observation. She will remain on Plavix long-term.           ______________________________  Charlies Constable, M.D. [facility]     BB/MEDQ  D:  12/05/2005  T:  12/07/2005  Job:  [id number]   cc:   Arturo Morton. Riley Kill, M.D. [facility]  [address]  [address]  [place]  [place] [address]   Jesse Sans. Wall, M.D.  [address]  [address]  [place]  [place] [address]   Laurita Quint, M.D.  Fax: [phone]

## 2011-01-24 NOTE — Cardiovascular Report (Signed)
[place]. [facility]  Patient:    Barry Small, Barry Small                   MRN: [id number] Proc. Date: 03/26/00 Adm. Date:  [id number] Attending:  Daisey Must CC:         Jesse Sans. Wall, M.D. [facility]             Cardiac Catheterization Lab                        Cardiac Catheterization  PROCEDURE: 1. Left heart catheterization with coronary angiography, left    ventriculography, bypass graft angiography, and abdominal aortography. 2. Percutaneous transluminal coronary angioplasty with stent placement x 2    in the saphenous vein graft to the posterior descending artery.  CARDIOLOGIST:  Daisey Must, M.D. [facility]  INDICATION:  Barry Small is a 77 year old male who underwent four-vessel bypass grafting in 1988.  He has recently been having exertional angina and was referred for cardiac catheterization.  CATHETERIZATION PROCEDURAL NOTE:  A 6-French sheath was placed in the right femoral artery.  Catheters utilized included 6-French JL4, JR4, and 6-French angled pigtail.  Contrast is Omnipaque.  There were no complications.  CATHETERIZATION RESULTS:  HEMODYNAMICS:  Left ventricular pressure 134/18, aortic pressure 136/70.  No aortic valve gradient.  LEFT VENTRICULOGRAM:  Wall motion is normal.  Ejection fraction calculated at 58%.  There was trace mitral regurgitation.  ABDOMINAL AORTOGRAM:   Abdominal aortogram reveals angiography to be normal abdominal aorta and renal arteries and iliac arteries.  CORONARY ANGIOGRAPHY: (Right dominant).  Left main has an ostial 25 and distal 20% stenosis.  LAD has a diffuse 80% stenosis in the proximal vessel followed by 100% occlusion of the proximal vessel just after the first septal perforator.  Left circumflex has a tubular 60% stenosis in the mid vessel extending into the origin of OM-3.  The circumflex gives rise to a small ramus intermedius, small OM-1, small OM-2, and a large OM-3.  Right coronary artery has  a diffuse 50% stenosis in the proximal vessel.  The mid vessel has a 90% followed by 100% occlusion.  There are faint right-to-right collaterals filling the distal right coronary.  There are also faint left-to-right collaterals filling the posterior descending artery.  Left internal mammary artery to the distal LAD is patent throughout its course and fills the mid and distal LAD.  Saphenous vein graft to the first diagonal is patent throughout its course, filling the first diagonal and a portion of the mid LAD.  Saphenous vein graft to obtuse marginal is 100% occluded at its origin.  Saphenous vein graft to the posterior descending artery has a complex 99% stenosis with thrombus near the ostium.  Just after this is a 60% stenosis. The mid vessel has a 30% stenosis.  The distal graft has a 95% stenosis with haze.  IMPRESSIONS: 1. Preserved left ventricular systolic function. 2. Native three-vessel coronary artery disease characterized by total    occlusion of left anterior descending artery and the mid right coronary    artery.  There is moderate disease in the left circumflex system. 3. Status post coronary artery bypass grafting with patent grafts to the    left anterior descending artery and diagonal.  The graft to obtuse marginal    is occluded; however, the lesion in the mid circumflex does not appears to    be obstructive.  The culprit is obviously the saphenous vein graft  to the    posterior descending artery with complex disease as described.  PLAN:  Percutaneous intervention of the vein graft to posterior descending artery.   See below.  PTCA PROCEDURE NOTE:  Following completion of diagnostic catheterization, we opted to proceed with percutaneous intervention.  The patient was enrolled in the EXTRACT trial.  He was randomized to conventional therapy.  We used a 7-French JR4 guiding catheter with side holes.  Heparin and Integrilin were administered per protocol.  A BMW  wire was successfully passed across both lesions and advanced into the distal posterior descending artery. Predilatation of the proximal stenosis was first performed with a 2.5 x 22 mm Maverick balloon inflated to 6 atmospheres.  This balloon was then advanced and positioned across the distal lesion where it was inflated to 8 atmospheres.  The same balloon was pulled back and positioned across the proximal lesion where it was inflated to 12 atmospheres.  We then placed a 4.0 x 18 mm NIR elite stent across the lesion in the distal vein graft.  The stent was deployed at 10 atmospheres.  We then went up again with the same stent delivery balloon to 14 atmospheres.  At that point, we had an excellent result in the distal area with 0% residual stenosis.  We then placed a 4.0 x 38 mm Ultra stent across in the proximal graft with the proximal portion of the stent being placed at the ostium of the graft.  This stent extended across both the 99% stenosis followed by the 60% stenosis in the proximal vessel. The stent was deployed at 13 atmospheres.  We then used the NIR stent delivery balloon to post dilate the Ultra stent.  The balloon was positioned in the mid portion of the stent and inflated to 17 atmospheres.  It was positioned in the distal aspect of the stent and inflated to 11 atmospheres and then in the proximal aspect of the stent and inflated to 12 atmospheres.   Plain angiographic images revealed an excellent result.  There was 0% residual stenosis at both sites and TIMI-3 flow into the distal vessel.  COMPLICATIONS:  None.  RESULTS:  Successful PTCA with stent placement x 2 in the saphenous vein graft to the posterior descending artery.  A distal 95% stenosis was reduced to 0% residual.  A proximal 99% stenosis with thrombus and TIMI-2 flow followed by 60% stenosis was reduced with 0% residual and TIMI-3 flow.  PLAN:  Integrilin will be continued overnight.  Plavix will be  administered for four weeks. DD:  03/26/00 TD:  03/26/00 Job: [id number]

## 2011-01-24 NOTE — Discharge Summary (Signed)
[place]. [facility]  Patient:    Barry Small, Barry Small                   MRN: [id number] Adm. Date:  [id number] Attending:  Daisey Must CC:         Jesse Sans. Wall, M.D. [facility]             Dr. Sherrlyn Hock, [place], [facility]                           Discharge Summary  SUMMARY AND HISTORY:  Mr. Cavenaugh is a 77 year old white male who in recent years has had several Cardiolite studies for his coronary artery disease. Approximately three weeks ago he developed classic effort angina, manifested by bilateral arm pain which was relieved with rest.  Thus his admission for catheterization.  He has a history of DD:  03/27/00 TD:  03/27/00 Job: [id number]

## 2011-01-24 NOTE — Assessment & Plan Note (Signed)
[facility] OFFICE NOTE   HALL, BIRCHARD                    MRN:          [id number]  DATE:11/25/2006                            DOB:          02/09/1934    Barry Small returns today for further management of coronary artery  disease.  He is now nearly 6 months out from 2 TAXUS stents to a vein  graft to the right coronary artery.  Stress Myoview February 11, 2006 showed  an EF of 65% with no ischemia.   He is having no symptoms of angina.  He is seeing Dr. Hetty Ely this  afternoon for complete blood work.  He is due lipids.   His meds are:  1. Aspirin 325 a day.  2. Plavix 75 mg a day.  3. Atacand 16 mg a day.  4. Vytorin 10/40 daily.  5. Lopressor 25 mg a day.  6. Multivitamin daily.   He is having some reflux symptoms.  He wants a generic PPI.   His blood pressure today is 140/78.  Pulse 67 and regular.  His weight  is 184.  HEENT:  Normocephalic and atraumatic.  PERRLA.  Extraocular movements  intact.  Sclerae are clear.  Facial symmetry is normal.  Dentition is  satisfactory.  Carotid upstrokes are equal bilaterally without bruits.  No JVD.  Thyroid is not enlarged.  Trachea is midline.  LUNGS:  Are clear.  HEART:  Reveals a regular rate and rhythm with a nondisplaced PMI.  ABDOMINAL EXAM:  Slightly protuberant with good bowel sounds.  Organomegaly could not be assessed.  EXTREMITIES:  No cyanosis or clubbing.  Trace edema.  Pulses are intact.  NEUROLOGIC:  Exam is intact.   His electrocardiogram is normal except for old inferior Q waves from his  previous infarct.   ASSESSMENT AND PLAN:  Barry Small is doing remarkably well.  We  continued his current medications.  I will see if I can get him a proton  pump inhibitor generic, or Dr. Hetty Ely can.  I will see him back again  in 6 months.    Thomas C. Daleen Squibb, MD, [facility]  Electronically Signed   TCW/MedQ  DD: 11/25/2006  DT: 11/25/2006  Job  #: [id number]   cc:   Arta Silence, MD

## 2011-01-24 NOTE — Discharge Summary (Signed)
[place]. [facility]  Patient:    Barry Small, Barry Small                   MRN: [id number] Adm. Date:  [id number] Disc. Date: 03/27/00 Attending:  Daisey Must Dictator:   Joellyn Rued, P.A.C. CC:         Thomas C. Wall, M.D. [facility]             Dr. Mliss Fritz, [facility]                           Discharge Summary  DATE OF BIRTH:  June 06, 1924  ADMITTING HISTORY:  [name] is a 77 year old white male who in recent years has had several Cardiolite studies to follow his coronary artery disease.  Approximately three weeks ago, he developed classic-effort angina manifested by bilateral arm pain relieved with rest; thus, his admission for catheterization.  He has a history of inferior MI with TPA in 1988.  He underwent coronary artery bypass grafting with a LIMA to the LAD, saphenous vein graft to the circumflex, marginal artery into the PDA by Dr. Artis Flock at [facility].  History is also notable for hypertension and hyperlipidemia.  LABORATORY DATA:  PT 13.7, PTT 37.  CK negative. WBC 8.2, hemoglobin and hematocrit 16.1/35.0, normal indices, platelets 153.  Chest x-ray did not reveal any active disease.  EKG shows sinus bradycardia, left axis deviation, old inferior myocardial infarction.  HOSPITAL COURSE:  [name] underwent cardiac catheterization by Dr. Gerri Spore.  According to the progress notes, he had native three-vessel coronary artery disease, LIMA to LAD and saphenous vein graft to the diagonal. Both were patent.  The saphenous vein graft to the PDA showed a 99% proximal lesion with thrombus followed by 60% lesion, 30% mid lesion, and 95% distal lesion.  EF was 58% with trace MR.  Dr. Gerri Spore, after reviewing the films, placed a stent in the distal and proximal grafts, reducing them from 95% and 99% to 0%.  Postprocedure, he was continued on Integrilin for approximately 20 hours.  Post sheath removal and bed rest, he was ambulating  without difficulty, and catheterization site remained intact.  By July 20, he was doing well, and Dr. Daleen Squibb felt that he could be discharged home.  DISCHARGE DIAGNOSIS:  Unstable angina status post angioplasty/stenting of the distal and proximal saphenous vein graft to the right coronary artery. Catheterization shows native three-vessel coronary artery disease with normal left ventricular function.  History as previously.  DISPOSITION:  He is discharged home.  He is asked to continue his home medications.  DISCHARGE MEDICATIONS: 1. Lopressor 25 mg q.d. 2. Garlic pill q.d. 3. Multiple vitamin q.d. 4. Vitamin C q.d. 5. Vitamin E 1000 units q.d. 6. Aspirin 81 mg q.d. 7. Lipitor 10 mg q.h.s. 8. He was given an new prescription for Plavix 75 mg q.d. for 4 weeks.  ACTIVITY:  He was advised no lifting, driving, sexual activity, or heavy exertion for two days.  DIET: Maintain low-salt, low-fat, low-cholesterol diet.  WOUND CARE: If he has any problems with his catheterization site, he is asked to call immediately.  FOLLOWUP:  He will see Dr. Daleen Squibb on Friday, August 10, at 3 p.m. DD:  03/27/00 TD:  03/27/00 Job: [id number]

## 2011-01-24 NOTE — Cardiovascular Report (Signed)
NAME:  Barry Small, Barry Small NO.:  [id number]   MEDICAL RECORD NO.:  [id number]          PATIENT TYPE:  INP   LOCATION:  2909                         FACILITY:  [facility]   PHYSICIAN:  Barry Small, M.D. [facility] OF BIRTH:  1934-02-24   DATE OF PROCEDURE:  12/03/2005  DATE OF DISCHARGE:                              CARDIAC CATHETERIZATION   INDICATIONS:  [name] is a pleasant 77 year old gentleman who presents  to the emergency room with pain last night.  His EKG now shows some ST  elevation in the inferior leads.  He has had a prior inferior infarct.  He  had bypass surgery at [facility] in 1988; at that time, he had a vein graft to the  distal right, vein graft to the OM, sequential vein graft to the diagonal  and mid-LAD and internal mammary to the LAD.  In 2001, the patient had a  stenosis in the vein graft to the distal right and this underwent stenting.  The vein graft to the OM was occluded.  The mammary and diagonal grafts were  all patent.  He now presents with recurrent symptoms which have been going  on for sometime according to his wife.  He was seen by Dr. Gala Romney in the  emergency room and brought to the catheterization laboratory for urgent  intervention.   PROCEDURES:  1.  Left heart catheterization.  2.  Selective coronary arteriography.  3.  Selective left ventriculography.  4.  Saphenous vein graft angiography x3.  5.  Selective left internal mammary angiography.  6.  PTCA of the saphenous vein graft with thrombectomy.   DESCRIPTION OF PROCEDURE:  The patient was brought to the cath lab and  prepped and draped in the usual fashion. Through an anterior puncture, the  right femoral artery was entered and a 6-French sheath was initially placed.  Following this, views of the left and right coronary arteries were obtained.  Sequential vein graft injections were then done in the OM, diagonal, and  right coronary graft.  We then injected the internal  mammary.  We deferred  ventriculography until after the procedure.  Id discussed the case with Dr.  Gala Romney with persistent ST elevation.  It was felt that an attempt at  percutaneous intervention would be warranted.  With this, he was given  heparin and Integrilin.  He was also enrolled in the CHAMPION trial.  A  right bypass catheter with side holes was then utilized to engage the right  ostium.  A Prowater wire was then easily advanced down into the distal  vessel.  Several inflations were performed which showed some trickle flow  into the distal vessel.  After multiple inflations into the stented area,  there was then a little bit better but still slow flow down the distal  vessel revealing a subtotal occlusion in a distal part of the vein graft.  We then dilated this particular area, and with this there was a dramatic  increase in antegrade flow.  There continued to be multiple areas of defect  in the graft and several dilatations were performed.  We used  a diver  thrombectomy catheter to try to pass several attempts to remove clot and  actually in fact significant clot which look like old clot was removed from  the vein graft.  Flow then appeared to improve somewhat.  We were able to  get the Prowater wire into the distal vessel and retrograde pass it into the  proximal PDA and into the posterolateral branch.  We used a 2 mm balloon to  enhance flow in the distal end of the graft where there was evidence of also  thrombus.  Despite multiple inflations and repeat thrombectomy with an  export catheter, the area of the previous stent still appeared to be hazy  with probable thrombus.  With this point, we had improvement in ST segments,  an antegrade TIMI III flow.  As a result, I elected to abandon the procedure  at this point in time and treat the patient with TIMI III inhibitors.  The  plan is to bring the patient back after approximately 48 hours of aggressive  medical therapy to  see if many of these areas would improve now that we have  TIMI III flow.  We elected not to place a stent at the distal lesion, and in  part related to that. The patient was painfree and stable.  A 6-French  sheath was exchanged for a 7-French sheath.  He was taken to the holding  area in satisfactory clinical condition.   HEMODYNAMIC DATA:  1.  Central aortic pressure 132/70, mean 98.  2.  LV pressure 130/17.  3.  No gradient on pullback across the aortic valve.   ANGIOGRAPHIC DATA:  1.  Left main has about 70% ostial narrowing.  2.  The LAD is totally occluded.  3.  The ramus intermedius is small and the AV circumflex is moderate in      size.  These do not have high-grade stenoses.  4.  The obtuse marginal branch graft is totally occluded proximally.  5.  The graft to the diagonal and mid-left anterior descending artery has      about 30% narrowing proximally but fairly brisk flow into the diagonal      and mid-LAD.  Multiple septal perforators were highlighted, and there      was flow to the distal right coronary circulation.  6.  The internal mammary to the distal LAD appears to be widely patent and      there is competitive communication between the mid and distal vessel.      This also supplies collaterals to the distal right.  7.  The right coronary artery is severely diseased in the proximal      midportion and totally occluded.  8.  The saphenous vein graft to the posterior descending branch is totally      occluded at the origin of the stent.  Following revascularization, the      stent has a lot of diffuse clot throughout.  However, there was TIMI III      flow.  There was some streaming in the mid-graft and there was a      subtotal occlusion which was reduced to about 40% more distally just      near the insertion into the PDA.  There was extensive clot which was      probably embolized from the proximal graft.  This was improved after     balloon dilatation and as  noted there was TIMI III flow.  9.  Ventriculography in  the RAO projection reveals an ejection fraction in      the range of about 50% with some inferior hypokinesis.   CONCLUSION:  1.  Successful percutaneous revascularization of the saphenous vein graft to      the posterior descending branch with extensive disease and diffuse      clotting.  2.  Continued patency internal mammary to the left anterior descending      artery.  3.  Continued patency of the saphenous vein graft to the diagonal and mid-      left anterior descending artery.  4.  Collateralization of the distal right circulation from the saphenous      vein graft and internal mammary derived left anterior descending artery.  5.  A 70% left main stenosis leading into the obtuse marginal with a known      occluded obtuse marginal graft.   DISPOSITION:  Fortunately, the patient's mammary is open.  The vein graft to  the diagonal and mid-LAD is also open and this provides some collaterals to  the distal right so the patient may be stable overall on a long-term basis.  The lesion into the left at the left main is not critical and could be  treated medically at the present time.  We likely will bring the patient  back to the catheterization laboratory on Friday to get a look at the right  coronary after the  patient has had adequate antiplatelet therapy for 48 hours.  Our hope is  that this will improve, although we may need to defer any further  intervention.  Based upon the size of the distal right coronary, it would be  unlikely that it would be wise to proceed with a redo surgery at this time.      Barry Small, M.D. [facility]  Electronically Signed     TDS/MEDQ  D:  12/03/2005  T:  12/04/2005  Job:  [id number]   cc:   Thomas C. Wall, M.D.  [address]  [address]  [place]  [place] [address]   Arvilla Meres, M.D. [facility]  [employer]  [address]  [place]  [place] [address]   CV Laboratory   Patient's  medical record

## 2011-01-24 NOTE — Discharge Summary (Signed)
NAME:  Barry Small, Barry Small NO.:  [id number]   MEDICAL RECORD NO.:  [id number]          PATIENT TYPE:  INP   LOCATION:  2909                         FACILITY:  [facility]   PHYSICIAN:  Vida Roller, M.D.   DATE OF BIRTH:  September 04, 1934   DATE OF ADMISSION:  12/03/2005  DATE OF DISCHARGE:  12/06/2005                                 DISCHARGE SUMMARY   PRIMARY CARE PHYSICIAN:  Dr. Collie Siad.   DISCHARGE DIAGNOSES:  1.  Inferior ST elevation myocardial infarction/coronary artery disease.      1.  Status post inferior wall myocardial infarction 1988 treated with          TPA and subsequent coronary artery bypass graft x4 by Dr. Ellen Henri at [facility] with left internal mammary artery to the left anterior          descending artery, vein graft to the obtuse marginal, vein graft to          the diagonal and vein graft to the posterior descending artery.      2.  March 26, 2000, percutaneous coronary intervention and stenting of          the vein graft to the posterior descending artery with a 4.0 x 38 mm          Ultra stent and a 4.0 x 18 mm NIR stent.  2.  Hypertension.  3.  Hyperlipidemia.  4.  Myoview in 2006 with normal ejection fraction of 70%.   PROCEDURES:  1.  Left heart catheterization.  2.  SGA.  3.  SLVG.  4.  SVGA x3.  5.  SLIM x1.  6.  PTCA of SVG with thrombectomy.   HISTORY OF PRESENT ILLNESS:  The patient is a 77 year old Caucasian male  with a history of coronary artery disease, status post CABG x3 with a PCI to  the vein graft of the PDA with two bare-metal stents in July of 2001.  The  patient has chest pain on December 03, 2005 while working in his yard during  the day and chest pain worsened during the evening.  The patient was  admitted through [facility] for evaluation of his chest pain.  Cardiac enzymes were cycled and EKG was obtained.   HOSPITAL COURSE:  The patient was admitted on December 03, 2005.  He was taken  to the catheter lab on  December 03, 2005 for a cardiac catheterization which  showed occluded SVG to the RCA graft with a clot at the stent and a subtotal  distal lesion in the distal graft with an extensive clot.  The plan at that  time was to continue IV medications eptifibatide, restudy on Friday and then  do a stent at that point.  The patient continued to have some minor chest  pain and was taken back to the catheter lab on December 05, 2005 where two  stents were placed in his SVG to his RCA.  Those stents were Taxus stents.  The patient did well postprocedure without any chest pain and was seen  on  December 06, 2005 by Dr. Dorethea Clan who felt the patient was stable to be  discharged home.   The patient will be followed up by Dr. Daleen Squibb in two weeks at [facility]  [facility].  He was to be discharged home on a heart healthy, low-fat, low-  cholesterol diet and to increase his activity slowly.  The patient is also  to be discharged on the following medications:  Plavix 75 mg daily, aspirin  325 mg daily, Vytorin 10/40 q.h.s., Lopressor 25 mg b.i.d., Atacand 16 mg  daily.   His EKG on December 06, 2005 showed a normal sinus rhythm with a left axis  deviation and a previous inferior infarct, a ventricular rate of 66.   His discharge laboratory showed a hemoglobin of 14.5, hematocrit 42.3,  platelet count of 124,000.  His sodium was 138, potassium was 4.0, BUN 8,  creatinine 0.8, glucose of 115.  His INR was 1.0.  The patient may be  considered for cardiac rehab per Dr. Daleen Squibb upon return for office visit.  Total time less than 30 minutes.     ______________________________  April Humphrey, NP      Vida Roller, M.D.  Electronically Signed    AH/MEDQ  D:  12/06/2005  T:  12/09/2005  Job:  [id number]   cc:   Collie Siad, M.D.   Thomas C. Wall, M.D.  [address]  [address]  [place]  [place] [address]   Vida Roller, M.D.  Fax: [phone]

## 2011-01-24 NOTE — H&P (Signed)
NAME:  Barry Small, Barry Small NO.:  [id number]   MEDICAL RECORD NO.:  [id number]          PATIENT TYPE:  INP   LOCATION:  2909                         FACILITY:  [facility]   PHYSICIAN:  Arvilla Meres, M.D. [facility] OF BIRTH:  Sep 22, 1933   DATE OF ADMISSION:  12/03/2005  DATE OF DISCHARGE:                                HISTORY & PHYSICAL   PRIMARY CARDIOLOGIST:  Dr. Valera Castle   PRIMARY CARE PHYSICIAN:  Dr. Laurita Quint   PATIENT PROFILE:  A 77 year old married white male with prior history of CAD  who presents with acute inferior ST elevation MI.   PROBLEM LIST:  1.  Inferior ST elevation myocardial infarction/coronary artery disease.      1.  Status post inferior myocardial infarction in 1988 treated with tPA          and subsequently coronary artery bypass graft x4 by Dr. Ronnell Guadalajara          at [facility] with left internal mammary artery to the left anterior          descending artery, vein graft to the obtuse marginal, vein graft to          diagonal, vein graft to the posterior descending artery.      2.  March 26, 2000, percutaneous coronary intervention and stenting of          the vein graft to the posterior descending artery with a 4.0 x 38 mm          Ultra stent and a 4.0 x 18 mm NIR stent.  2.  Hypertension.  3.  Hyperlipidemia.  4.  Had a Myoview in January 2006 which was normal with an ejection fraction      of 70%.   HISTORY OF PRESENT ILLNESS:  A 77 year old married white male with history  of CAD status post CABG x3 with PCI of the vein graft to the PDA with two  bare metal stents in July 2001.  He was in his usual state of health last  night after working in the yard all day without any difficulty when he  developed 2/10 substernal chest pressure at approximately 6:30 p.m.  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   Notably, his symptoms were different than previous angina in  that his previous angina was primarily in the shoulders.  He went to bed  last night without any problems and slept well.  This morning he got up and  went to work without any recurrent chest pain.  He has had some gassy  sensations this morning.  His wife called his primary care physician and was  advised that he should come into the ED.  On arrival to the ED he was pain  free and EKG showed 1-2 mm ST elevation in leads III and aVF.  He is being  taken emergently to the cardiac catheterization laboratory.   ALLERGIES:  MORPHINE causes severe hypotension.   HOME MEDICATIONS:  1.  Metoprolol 25 mg daily.  2.  Vytorin occasionally.  3.  Aspirin 81 mg daily.  4.  Atacand 16 mg daily.   FAMILY HISTORY:  Mother died of pneumonia at 22.  Father died of MI at 59.   SOCIAL HISTORY:  Lives in [place] with his wife.  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  They have two grown children.  He has never  smoked cigarettes and does not use drugs.  He occasionally will have an  alcoholic beverage.   REVIEW OF SYSTEMS:  Positive for chest pain, shortness of breath.  Per the  patient's wife, he has been having spells of chest discomfort for about 6  months.  All other systems reviewed and negative.   PHYSICAL EXAMINATION:  VITAL SIGNS:  He is afebrile, heart rate 62,  respirations 16, blood pressure 156/101, pulse oximetry is 98% on 2 L.  GENERAL:  Pleasant white male in no acute distress.  Awake, alert, and  oriented x3.  NECK:  Normal carotid upstrokes.  No bruits or JVD.  LUNGS:  Respirations regular and unlabored, clear to auscultation.  CARDIAC:  Regular, S1, S2.  No S3, S4, or murmurs.  ABDOMEN:  Round, soft, nontender, nondistended.  Bowel sounds present x4.  EXTREMITIES:  Warm, dry, pink.  No clubbing, cyanosis, or edema.  Dorsalis  pedis and posterior tibial pulses 1+ and equal bilaterally.   Chest x-ray  is pending.  EKG shows sinus rhythm with a left axis, rate of  64, 1-2 mm ST elevation in III and aVF with ST depression in aVL.  Hemoglobin 16.7, hematocrit 49.0, WBC 9.7, platelets 156.  Sodium 138,  potassium 4.9, chloride 106, BUN 14, creatinine 0.8, glucose 106.  PTT 35,  PT 13.8, INR 1.0.   ASSESSMENT AND PLAN:  1.  Acute inferior ST elevation myocardial infarction.  He is being taken to      the cardiac catheterization laboratory for emergent catheterization.      Will continue aspirin, beta blocker, statin, and add Plavix therapy as      well.  2.  Hypertension.  Blood pressure is currently elevated.  Continue beta      blocker and ARB.  Titrate as necessary.  3.  Hyperlipidemia.  Check lipids and LFTs.  Continue statin therapy.      Ok Anis, NP      Arvilla Meres, M.D. [facility]  Electronically Signed    CRB/MEDQ  D:  12/03/2005  T:  12/04/2005  Job:  [id number]

## 2011-01-27 ENCOUNTER — Other Ambulatory Visit: Payer: Self-pay | Admitting: Cardiology

## 2011-01-27 ENCOUNTER — Other Ambulatory Visit (INDEPENDENT_AMBULATORY_CARE_PROVIDER_SITE_OTHER): Payer: Medicare Other | Admitting: Family Medicine

## 2011-01-27 DIAGNOSIS — I252 Old myocardial infarction: Secondary | ICD-10-CM

## 2011-01-27 DIAGNOSIS — Z125 Encounter for screening for malignant neoplasm of prostate: Secondary | ICD-10-CM

## 2011-01-27 LAB — COMPREHENSIVE METABOLIC PANEL
ALT: 22 U/L (ref 0–53)
AST: 20 U/L (ref 0–37)
Albumin: 3.8 g/dL (ref 3.5–5.2)
Alkaline Phosphatase: 51 U/L (ref 39–117)
BUN: 14 mg/dL (ref 6–23)
CO2: 25 mEq/L (ref 19–32)
Calcium: 9.1 mg/dL (ref 8.4–10.5)
Chloride: 100 mEq/L (ref 96–112)
Creatinine, Ser: 0.7 mg/dL (ref 0.4–1.5)
GFR: 115.06 mL/min (ref >60.00)
Glucose, Bld: 91 mg/dL (ref 70–99)
Potassium: 4.8 mEq/L (ref 3.5–5.1)
Sodium: 134 mEq/L — ABNORMAL LOW (ref 135–145)
Total Bilirubin: 0.9 mg/dL (ref 0.3–1.2)
Total Protein: 6.3 g/dL (ref 6.0–8.3)

## 2011-01-27 LAB — CBC WITH DIFFERENTIAL/PLATELET
Basophils Absolute: 0 10*3/uL (ref 0.0–0.1)
Basophils Relative: 0.2 % (ref 0.0–3.0)
Eosinophils Absolute: 0.2 10*3/uL (ref 0.0–0.7)
Eosinophils Relative: 2.3 % (ref 0.0–5.0)
HCT: 45.3 % (ref 39.0–52.0)
Hemoglobin: 15.5 g/dL (ref 13.0–17.0)
Lymphocytes Relative: 20.6 % (ref 12.0–46.0)
Lymphs Abs: 1.9 10*3/uL (ref 0.7–4.0)
MCHC: 34.1 g/dL (ref 30.0–36.0)
MCV: 93.5 fl (ref 78.0–100.0)
Monocytes Absolute: 0.9 10*3/uL (ref 0.1–1.0)
Monocytes Relative: 9.5 % (ref 3.0–12.0)
Neutro Abs: 6.4 10*3/uL (ref 1.4–7.7)
Neutrophils Relative %: 67.4 % (ref 43.0–77.0)
Platelets: 158 10*3/uL (ref 150.0–400.0)
RBC: 4.85 Mil/uL (ref 4.22–5.81)
RDW: 14.7 % — ABNORMAL HIGH (ref 11.5–14.6)
WBC: 9.4 10*3/uL (ref 4.5–10.5)

## 2011-01-27 LAB — TSH: TSH: 2.12 u[IU]/mL (ref 0.35–5.50)

## 2011-01-27 LAB — LIPID PANEL
Cholesterol: 140 mg/dL (ref 0–200)
HDL: 38 mg/dL — ABNORMAL LOW (ref >39.00)
LDL Cholesterol: 86 mg/dL (ref 0–99)
Total CHOL/HDL Ratio: 4
Triglycerides: 78 mg/dL (ref 0.0–149.0)
VLDL: 15.6 mg/dL (ref 0.0–40.0)

## 2011-01-27 LAB — PSA: PSA: 0.51 ng/mL (ref 0.10–4.00)

## 2011-01-28 ENCOUNTER — Other Ambulatory Visit (INDEPENDENT_AMBULATORY_CARE_PROVIDER_SITE_OTHER): Payer: Medicare Other | Admitting: *Deleted

## 2011-01-28 DIAGNOSIS — R011 Cardiac murmur, unspecified: Secondary | ICD-10-CM

## 2011-01-28 DIAGNOSIS — I252 Old myocardial infarction: Secondary | ICD-10-CM

## 2011-01-28 DIAGNOSIS — Z951 Presence of aortocoronary bypass graft: Secondary | ICD-10-CM

## 2011-01-28 DIAGNOSIS — R0602 Shortness of breath: Secondary | ICD-10-CM

## 2011-01-30 ENCOUNTER — Encounter: Payer: Self-pay | Admitting: Family Medicine

## 2011-01-30 ENCOUNTER — Ambulatory Visit (INDEPENDENT_AMBULATORY_CARE_PROVIDER_SITE_OTHER): Payer: PRIVATE HEALTH INSURANCE | Admitting: Family Medicine

## 2011-01-30 DIAGNOSIS — I10 Essential (primary) hypertension: Secondary | ICD-10-CM

## 2011-01-30 DIAGNOSIS — K219 Gastro-esophageal reflux disease without esophagitis: Secondary | ICD-10-CM

## 2011-01-30 DIAGNOSIS — Z86010 Personal history of colonic polyps: Secondary | ICD-10-CM

## 2011-01-30 DIAGNOSIS — K649 Unspecified hemorrhoids: Secondary | ICD-10-CM

## 2011-01-30 DIAGNOSIS — E7800 Pure hypercholesterolemia, unspecified: Secondary | ICD-10-CM

## 2011-01-30 DIAGNOSIS — Z0000 Encounter for general adult medical examination without abnormal findings: Secondary | ICD-10-CM

## 2011-01-30 MED ORDER — HYDROCORTISONE ACETATE 25 MG RE SUPP: 25.0000 mg | Freq: Three times a day (TID) | RECTAL | Status: DC | PRN

## 2011-01-30 MED ORDER — METOPROLOL TARTRATE 50 MG PO TABS: 25.0000 mg | ORAL_TABLET | Freq: Every day | ORAL | Status: DC

## 2011-01-30 NOTE — Progress Notes (Signed)
Elevated Cholesterol: Using medications without problems:yes Muscle aches: no Other complaints:see below  Hypertension:    Using medication without problems or lightheadedness: yes Chest pain with exertion:no Edema:no Short of breath:no H/o chest wall pain after exercise previously, but this is improved.   H/o Barrett's, on PPI and doing well.    Needs refills on suppositories.  Used with relief.   Meds, vitals, and allergies reviewed.   PMH and SH reviewed  ROS: See HPI.  Otherwise negative.    GEN: nad, alert and oriented HEENT: mucous membranes moist NECK: supple w/o LA CV: rrr, chest wall not ttp PULM: ctab, no inc wob ABD: soft, +bs EXT: no edema SKIN: no acute rash Prostate gland firm and smooth, no sig enlargement, nodularity, tenderness, mass, asymmetry or induration. Hemorrhoids noted.

## 2011-01-30 NOTE — Patient Instructions (Signed)
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  Take care.

## 2011-01-31 ENCOUNTER — Encounter: Payer: Self-pay | Admitting: Family Medicine

## 2011-01-31 DIAGNOSIS — Z0000 Encounter for general adult medical examination without abnormal findings: Secondary | ICD-10-CM | POA: Insufficient documentation

## 2011-01-31 NOTE — Assessment & Plan Note (Addendum)
Flu, shingles, tdap and PNA vaccine up to date.   D/w pt JW:[id number] and exercise.

## 2011-01-31 NOTE — Assessment & Plan Note (Signed)
Check IFOB.

## 2011-01-31 NOTE — Assessment & Plan Note (Signed)
Labs d/w pt.  No change in meds.

## 2011-01-31 NOTE — Assessment & Plan Note (Signed)
Use suppositories prn.

## 2011-01-31 NOTE — Assessment & Plan Note (Signed)
Continue PPI due to h/o Barretts.

## 2011-01-31 NOTE — Assessment & Plan Note (Signed)
Labs d/w pt.  No change in meds.  

## 2011-02-04 ENCOUNTER — Encounter: Payer: Self-pay | Admitting: Cardiovascular Disease

## 2011-02-21 ENCOUNTER — Other Ambulatory Visit: Payer: Self-pay | Admitting: Family Medicine

## 2011-02-21 ENCOUNTER — Other Ambulatory Visit: Payer: Medicare Other

## 2011-02-21 DIAGNOSIS — Z1211 Encounter for screening for malignant neoplasm of colon: Secondary | ICD-10-CM

## 2011-02-21 DIAGNOSIS — Z1289 Encounter for screening for malignant neoplasm of other sites: Secondary | ICD-10-CM

## 2011-02-21 LAB — FECAL OCCULT BLOOD, IMMUNOCHEMICAL: Fecal Occult Bld: NEGATIVE

## 2011-02-24 ENCOUNTER — Encounter: Payer: Self-pay | Admitting: *Deleted

## 2011-02-25 ENCOUNTER — Encounter: Payer: Self-pay | Admitting: Cardiovascular Disease

## 2011-04-30 ENCOUNTER — Telehealth: Payer: Self-pay

## 2011-04-30 NOTE — Telephone Encounter (Signed)
Pt's wife said husband has spit or coughed up blood x 3 today (amount about size of end of thumb each time.) does not know if blood colored dark or bright red. No abdominal pain, no nausea no vomiting. Pt had sinus problem 2 weeks ago. No trouble breathing  And no pain when takes breath. Pt uses [facility]. No available appts today. Pt's wife request call back at [phone].Please advise.

## 2011-04-30 NOTE — Telephone Encounter (Signed)
Patient's wife advised

## 2011-04-30 NOTE — Telephone Encounter (Signed)
Please call them back.  You can get irritation from sinus symptoms.  That may be the cause.  If not actively bleeding and feeling okay o/w, this would be okay for check up tomorrow.  I would hold aspirin in meantime.  If profuse bleeding, then he would be need to seen at ER. Avoid sucking fluid through a straw as this can make the bleeding worse.

## 2011-05-01 ENCOUNTER — Encounter: Payer: Self-pay | Admitting: Family Medicine

## 2011-05-01 ENCOUNTER — Ambulatory Visit (INDEPENDENT_AMBULATORY_CARE_PROVIDER_SITE_OTHER): Payer: Medicare Other | Admitting: Family Medicine

## 2011-05-01 DIAGNOSIS — R042 Hemoptysis: Secondary | ICD-10-CM

## 2011-05-01 NOTE — Patient Instructions (Signed)
I think this will get better.  Let me know if you continue to have troubles.  Take care.

## 2011-05-01 NOTE — Progress Notes (Signed)
He had some sinus sx, cold sx and congestion.  Had some pain under L eye and discolored rhinorrhea.  Had some productive cough.  "The cough is usually the last thing to go away when I have this this."  Some some dark red sputum, episodic yesterday, but clear sputum today at lunch.  He had taken some aleve and was on aspirin.    No chest pain.  Not sob.  No fevers.  The cough is getting better, and generally he feels better.  Nonsmoker.    Meds, vitals, and allergies reviewed.   ROS: See HPI.  Otherwise, noncontributory.  nad ncat Tm wnl Nasal exam wnl Op with mild cobblestoning Neck supple, no LA rrr ctab

## 2011-05-02 DIAGNOSIS — R042 Hemoptysis: Secondary | ICD-10-CM | POA: Insufficient documentation

## 2011-05-02 NOTE — Assessment & Plan Note (Signed)
Resolved now. Likely due to superficial irritation in upper airway with recent and apparently resolving URI.  Possibly aggravated by NSAIDs.  D/w pt that he is well appearing and okay for outpatient f/u.  Should sx return, then notify the clinic.  He agrees.

## 2011-05-22 ENCOUNTER — Telehealth: Payer: Self-pay

## 2011-05-22 NOTE — Telephone Encounter (Signed)
Gave samples of crestor 20 mg 

## 2011-07-18 ENCOUNTER — Encounter: Payer: PRIVATE HEALTH INSURANCE | Admitting: Family Medicine

## 2011-07-30 ENCOUNTER — Ambulatory Visit (INDEPENDENT_AMBULATORY_CARE_PROVIDER_SITE_OTHER): Payer: Medicare Other | Admitting: Cardiovascular Disease

## 2011-07-30 ENCOUNTER — Encounter: Payer: Self-pay | Admitting: Cardiovascular Disease

## 2011-07-30 VITALS — BP 158/78 | HR 61 | Ht 68.0 in | Wt 188.0 lb

## 2011-07-30 DIAGNOSIS — E7800 Pure hypercholesterolemia, unspecified: Secondary | ICD-10-CM

## 2011-07-30 DIAGNOSIS — I10 Essential (primary) hypertension: Secondary | ICD-10-CM

## 2011-07-30 DIAGNOSIS — I2510 Atherosclerotic heart disease of native coronary artery without angina pectoris: Secondary | ICD-10-CM

## 2011-07-30 MED ORDER — LISINOPRIL 20 MG PO TABS: 20.0000 mg | ORAL_TABLET | Freq: Every day | ORAL | Status: DC

## 2011-07-30 MED ORDER — METOPROLOL TARTRATE 25 MG PO TABS: 25.0000 mg | ORAL_TABLET | Freq: Two times a day (BID) | ORAL | Status: DC

## 2011-07-30 NOTE — Assessment & Plan Note (Signed)
I suggested he take Crestor daily rather than every other day as his LDL is now above goal. Goal LDL less than 70.

## 2011-07-30 NOTE — Patient Instructions (Addendum)
You are doing well. Please increase the metoprolol to a 1/2 pill twice a day Monitor your blood pressure next week. If blood pressure is still high, increase the lisinopril to 10 mg a day (1/2 of the 20 mg )  Please call us if you have new issues that need to be addressed before your next appt.  The office will contact you for a follow up Appt. In 6 months  Ask the pharmacy about the price of lipitor

## 2011-07-30 NOTE — Progress Notes (Signed)
Patient ID: Barry Small, male    DOB: 04/18/1934, 77 y.o.   MRN: [id number]  HPI Comments: [name] is a pleasant 77 year old gentleman with a history of coronary artery disease, 4 vessel bypass in 1989 at [facility], history of stenting in 2007 after presenting to [facility] with epigastric and chest discomfort, presented for routine followup.   Overall he states that he is doing well. He is still very active though has been working hard and not doing his exercise. His weight is up slightly. He has increased his salt, is drinking more buttermilk though has cut back on this recently after noticing his blood pressure has been elevated.  We spent significant time talking about his blood pressure. He is having systolic pressure in the 150-160 range. Diastolic pressures well controlled.  He is also cutting back on his Crestor taking this every other day. With this, his LDL has increased to 86.  His last stress nuclear study was in June of 2009. EF was normal with an old inferior wall infarct scar. There was no ischemia.  EKG shows normal sinus rhythm with rate 61 beats per minute with no significant ST-T wave changes., possible old inferior infarct.      Outpatient Encounter Prescriptions as of 07/30/2011  Medication Sig Dispense Refill  . aspirin 81 MG EC tablet Take 81 mg by mouth 2 (two) times daily.        . cholecalciferol (VITAMIN D) 1000 UNITS tablet Take 2,000 units per day.      . hydrocortisone (ANUSOL-HC) 2.5 % rectal cream Apply to area 2 to 4 times daily as needed for itching.       . hydrocortisone (ANUSOL-HC) 25 MG suppository Place 1 suppository (25 mg total) rectally 3 (three) times daily as needed.  24 suppository  5  . Multiple Vitamin (MULTIVITAMIN) tablet Take 1 tablet by mouth daily.        . nitroGLYCERIN (NITROSTAT) 0.4 MG SL tablet Place 1 tablet (0.4 mg total) under the tongue every 5 (five) minutes as needed for chest pain.  25 tablet  3  . omeprazole  (PRILOSEC) 20 MG capsule Take 20 mg by mouth daily as needed.       . rosuvastatin (CRESTOR) 20 MG tablet 10 mg. 1/2 tab by mouth daily      . vitamin B-12 (CYANOCOBALAMIN) 1000 MCG tablet Take 1,000 mcg by mouth daily.        Marland Kitchen  lisinopril (PRINIVIL,ZESTRIL) 5 MG tablet Take 5 mg by mouth daily.        .  metoprolol (LOPRESSOR) 50 MG tablet Take 0.5 tablets (25 mg total) by mouth daily.  90 tablet  1    Review of Systems  Constitutional: Negative.   HENT: Negative.   Eyes: Negative.   Gastrointestinal: Negative.   Musculoskeletal: Negative.   Skin: Negative.   Neurological: Negative.   Hematological: Negative.   Psychiatric/Behavioral: Negative.   All other systems reviewed and are negative.   BP 158/78  Pulse 61  Ht 5\' 8"  (1.727 m)  Wt 188 lb (85.276 kg)  BMI 28.59 kg/m2   Physical Exam  Nursing note and vitals reviewed. Constitutional: He is oriented to person, place, and time. He appears well-developed and well-nourished.  HENT:  Head: Normocephalic.  Nose: Nose normal.  Mouth/Throat: Oropharynx is clear and moist.  Eyes: Conjunctivae are normal. Pupils are equal, round, and reactive to light.  Neck: Normal range of motion. Neck supple. No JVD present.  Cardiovascular: Normal rate, regular rhythm, S1 normal, S2 normal and intact distal pulses.  Exam reveals no gallop and no friction rub.   Murmur heard.  Crescendo systolic murmur is present with a grade of 2/6  Pulmonary/Chest: Effort normal and breath sounds normal. No respiratory distress. He has no wheezes. He has no rales. He exhibits no tenderness.  Abdominal: Soft. Bowel sounds are normal. He exhibits no distension. There is no tenderness.  Musculoskeletal: Normal range of motion. He exhibits no edema and no tenderness.  Lymphadenopathy:    He has no cervical adenopathy.  Neurological: He is alert and oriented to person, place, and time. Coordination normal.  Skin: Skin is warm and dry. No rash noted. No  erythema.  Psychiatric: He has a normal mood and affect. His behavior is normal. Judgment and thought content normal.           Assessment and Plan

## 2011-07-30 NOTE — Assessment & Plan Note (Signed)
Blood pressure has been elevated likely secondary to dietary indiscretion and salt intake. We will increase his metoprolol tartrate to 25 mg b.i.d.. He will monitor his blood pressure and if it continues to be elevated, he'll increase the lisinopril to 10 mg daily. If pressure continues to be elevated, he can increase the lisinopril to 20 mg daily.

## 2011-07-30 NOTE — Assessment & Plan Note (Signed)
Currently with no symptoms of angina. No further workup at this time. Continue current medication regimen. 

## 2011-08-22 ENCOUNTER — Ambulatory Visit: Payer: Medicare Other | Admitting: Family Medicine

## 2011-09-24 ENCOUNTER — Other Ambulatory Visit: Payer: Self-pay | Admitting: Cardiovascular Disease

## 2011-09-24 NOTE — Telephone Encounter (Signed)
Refill sent for crestor.  

## 2011-10-16 DIAGNOSIS — J328 Other chronic sinusitis: Secondary | ICD-10-CM | POA: Diagnosis not present

## 2011-10-16 DIAGNOSIS — J018 Other acute sinusitis: Secondary | ICD-10-CM | POA: Diagnosis not present

## 2011-10-18 ENCOUNTER — Other Ambulatory Visit: Payer: Self-pay | Admitting: Family Medicine

## 2012-01-15 DIAGNOSIS — H16219 Exposure keratoconjunctivitis, unspecified eye: Secondary | ICD-10-CM | POA: Diagnosis not present

## 2012-02-26 ENCOUNTER — Other Ambulatory Visit: Payer: Self-pay | Admitting: Family Medicine

## 2012-02-26 DIAGNOSIS — I10 Essential (primary) hypertension: Secondary | ICD-10-CM

## 2012-03-01 ENCOUNTER — Other Ambulatory Visit: Payer: Medicare Other

## 2012-03-05 ENCOUNTER — Other Ambulatory Visit: Payer: Medicare Other

## 2012-03-16 ENCOUNTER — Encounter: Payer: Medicare Other | Admitting: Family Medicine

## 2012-05-19 ENCOUNTER — Ambulatory Visit (INDEPENDENT_AMBULATORY_CARE_PROVIDER_SITE_OTHER): Payer: Medicare Other | Admitting: Cardiovascular Disease

## 2012-05-19 ENCOUNTER — Encounter: Payer: Self-pay | Admitting: Cardiovascular Disease

## 2012-05-19 VITALS — BP 160/80 | HR 74 | Ht 68.0 in | Wt 186.2 lb

## 2012-05-19 DIAGNOSIS — E7800 Pure hypercholesterolemia, unspecified: Secondary | ICD-10-CM | POA: Diagnosis not present

## 2012-05-19 DIAGNOSIS — I2510 Atherosclerotic heart disease of native coronary artery without angina pectoris: Secondary | ICD-10-CM | POA: Diagnosis not present

## 2012-05-19 DIAGNOSIS — I10 Essential (primary) hypertension: Secondary | ICD-10-CM

## 2012-05-19 MED ORDER — LISINOPRIL 40 MG PO TABS: 40.0000 mg | ORAL_TABLET | Freq: Every day | ORAL | Status: DC

## 2012-05-19 NOTE — Assessment & Plan Note (Signed)
Blood pressure is elevated. We have suggested he increase his lisinopril to 20 mg daily and metoprolol tartrate to 12.5 mg twice a day. If blood pressure continues to be elevated after one week, he should increase the lisinopril to 40 mg daily.

## 2012-05-19 NOTE — Progress Notes (Signed)
Patient ID: Barry Small, male    DOB: 1934-06-08, 78 y.o.   MRN: [id number]  HPI Comments: [name] is a pleasant 78 year old gentleman with a history of coronary artery disease, 4 vessel bypass in 1989 at [facility], history of stenting in 2007 after presenting to [facility] with epigastric and chest discomfort, presented for routine followup.   Overall he states that he is doing well. He is still very active though has been working hard and not doing his exercise.  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 Blood pressure has been elevated. He has an exam next week for recertification to drive the bus. He has been taking low-dose lisinopril 10 mg daily with metoprolol tartrate 12.5 mg daily. Blood pressure at home is in the 130-140 range.  He reports that he continues to take Crestor 10 mg daily. His last stress nuclear study was in June of 2009. EF was normal with an old inferior wall infarct scar. There was no ischemia.   beats per minute with no significant ST-T wave changes. No significant ST or T wave changes      Outpatient Encounter Prescriptions as of 05/19/2012  Medication Sig Dispense Refill  . aspirin 81 MG EC tablet Take 81 mg by mouth daily.       . cholecalciferol (VITAMIN D) 1000 UNITS tablet Take 2,000 units per day.      . CRESTOR 20 MG tablet TAKE ONE-HALF TO ONE TABLET BY MOUTH DAILY  30 each  6  . hydrocortisone (ANUSOL-HC) 2.5 % rectal cream Apply to area 2 to 4 times daily as needed for itching.       . hydrocortisone (ANUSOL-HC) 25 MG suppository Place 1 suppository (25 mg total) rectally 3 (three) times daily as needed.  24 suppository  5  . metoprolol tartrate (LOPRESSOR) 12.5 mg TABS Take 12.5 mg by mouth daily.      . Multiple Vitamin (MULTIVITAMIN) tablet Take 1 tablet by mouth daily.        . nitroGLYCERIN (NITROSTAT) 0.4 MG SL tablet Place 1 tablet (0.4 mg total) under the tongue every 5 (five) minutes as needed for chest pain.  25 tablet  3    . omeprazole (PRILOSEC) 20 MG capsule Take 20 mg by mouth daily as needed.       . ranitidine (ZANTAC) 150 MG tablet Take 150 mg by mouth as needed.      . vitamin B-12 (CYANOCOBALAMIN) 1000 MCG tablet Take 1,000 mcg by mouth daily.        Marland Kitchen  lisinopril (PRINIVIL,ZESTRIL) 10 MG tablet Take 20 mg by mouth daily.         Review of Systems  Constitutional: Negative.   HENT: Negative.   Eyes: Negative.   Respiratory: Negative.   Cardiovascular: Negative.   Gastrointestinal: Negative.   Musculoskeletal: Negative.   Skin: Negative.   Neurological: Negative.   Hematological: Negative.   Psychiatric/Behavioral: Negative.   All other systems reviewed and are negative.   BP 160/80  Pulse 74  Ht 5\' 8"  (1.727 m)  Wt 186 lb 4 oz (84.482 kg)  BMI 28.32 kg/m2   Physical Exam  Nursing note and vitals reviewed. Constitutional: He is oriented to person, place, and time. He appears well-developed and well-nourished.  HENT:  Head: Normocephalic.  Nose: Nose normal.  Mouth/Throat: Oropharynx is clear and moist.  Eyes: Conjunctivae normal are normal. Pupils are equal, round, and reactive to light.  Neck: Normal range  of motion. Neck supple. No JVD present.  Cardiovascular: Normal rate, regular rhythm, S1 normal, S2 normal and intact distal pulses.  Exam reveals no gallop and no friction rub.   Murmur heard.  Crescendo systolic murmur is present with a grade of 2/6  Pulmonary/Chest: Effort normal and breath sounds normal. No respiratory distress. He has no wheezes. He has no rales. He exhibits no tenderness.  Abdominal: Soft. Bowel sounds are normal. He exhibits no distension. There is no tenderness.  Musculoskeletal: Normal range of motion. He exhibits no edema and no tenderness.  Lymphadenopathy:    He has no cervical adenopathy.  Neurological: He is alert and oriented to person, place, and time. Coordination normal.  Skin: Skin is warm and dry. No rash noted. No erythema.  Psychiatric:  He has a normal mood and affect. His behavior is normal. Judgment and thought content normal.           Assessment and Plan

## 2012-05-19 NOTE — Assessment & Plan Note (Addendum)
We have suggested he stay on his Crestor faithfully. Goal LDL less than 70. He will check his labs in the next several weeks.  he is interested in changing Crestor to Lipitor for cost reasons . He could possibly take Lipitor 20 mg daily . He did report myalgias previously on Lipitor.

## 2012-05-19 NOTE — Patient Instructions (Addendum)
Please increase the lisinopril to 20 mg daily If your blood pressure continues to run high, increase the lisinopril to 40 mg daily Increase the metoprolol to twice a day  Please call us if you have new issues that need to be addressed before your next appt.  Your physician wants you to follow-up in: 6 months.  You will receive a reminder letter in the mail two months in advance. If you don't receive a letter, please call our office to schedule the follow-up appointment.

## 2012-05-19 NOTE — Assessment & Plan Note (Signed)
Currently with no symptoms of angina. No further workup at this time. Continue current medication regimen. 

## 2012-05-26 ENCOUNTER — Encounter: Payer: Self-pay | Admitting: Gastroenterology

## 2012-06-07 DIAGNOSIS — Z23 Encounter for immunization: Secondary | ICD-10-CM | POA: Diagnosis not present

## 2012-07-22 ENCOUNTER — Other Ambulatory Visit (INDEPENDENT_AMBULATORY_CARE_PROVIDER_SITE_OTHER): Payer: Medicare Other

## 2012-07-22 DIAGNOSIS — I10 Essential (primary) hypertension: Secondary | ICD-10-CM | POA: Diagnosis not present

## 2012-07-22 LAB — COMPREHENSIVE METABOLIC PANEL
ALT: 20 U/L (ref 0–53)
AST: 20 U/L (ref 0–37)
Albumin: 4.5 g/dL (ref 3.5–5.2)
Alkaline Phosphatase: 55 U/L (ref 39–117)
BUN: 13 mg/dL (ref 6–23)
CO2: 27 mEq/L (ref 19–32)
Calcium: 9.2 mg/dL (ref 8.4–10.5)
Chloride: 100 mEq/L (ref 96–112)
Creatinine, Ser: 0.8 mg/dL (ref 0.4–1.5)
GFR: 98.44 mL/min (ref >60.00)
Glucose, Bld: 114 mg/dL — ABNORMAL HIGH (ref 70–99)
Potassium: 4.6 mEq/L (ref 3.5–5.1)
Sodium: 134 mEq/L — ABNORMAL LOW (ref 135–145)
Total Bilirubin: 0.8 mg/dL (ref 0.3–1.2)
Total Protein: 7.5 g/dL (ref 6.0–8.3)

## 2012-07-22 LAB — LIPID PANEL
Cholesterol: 161 mg/dL (ref 0–200)
HDL: 41.5 mg/dL (ref >39.00)
LDL Cholesterol: 103 mg/dL — ABNORMAL HIGH (ref 0–99)
Total CHOL/HDL Ratio: 4
Triglycerides: 83 mg/dL (ref 0.0–149.0)
VLDL: 16.6 mg/dL (ref 0.0–40.0)

## 2012-07-29 ENCOUNTER — Ambulatory Visit (INDEPENDENT_AMBULATORY_CARE_PROVIDER_SITE_OTHER): Payer: Medicare Other | Admitting: Family Medicine

## 2012-07-29 ENCOUNTER — Encounter: Payer: Self-pay | Admitting: Family Medicine

## 2012-07-29 VITALS — BP 138/78 | HR 64 | Temp 97.6°F | Ht 68.0 in | Wt 188.0 lb

## 2012-07-29 DIAGNOSIS — K2270 Barrett's esophagus without dysplasia: Secondary | ICD-10-CM | POA: Diagnosis not present

## 2012-07-29 DIAGNOSIS — R7309 Other abnormal glucose: Secondary | ICD-10-CM | POA: Diagnosis not present

## 2012-07-29 DIAGNOSIS — Z0000 Encounter for general adult medical examination without abnormal findings: Secondary | ICD-10-CM

## 2012-07-29 DIAGNOSIS — I10 Essential (primary) hypertension: Secondary | ICD-10-CM | POA: Diagnosis not present

## 2012-07-29 DIAGNOSIS — E7800 Pure hypercholesterolemia, unspecified: Secondary | ICD-10-CM | POA: Diagnosis not present

## 2012-07-29 MED ORDER — ROSUVASTATIN CALCIUM 20 MG PO TABS: 20.0000 mg | ORAL_TABLET | Freq: Every day | ORAL | Status: DC

## 2012-07-29 NOTE — Progress Notes (Signed)
I have personally reviewed the Medicare Annual Wellness questionnaire and have noted 1. The patient's medical and social history 2. Their use of alcohol, tobacco or illicit drugs 3. Their current medications and supplements 4. The patient's functional ability including ADL's, fall risks, home safety risks and hearing or visual             impairment. 5. Diet and physical activities 6. Evidence for depression or mood disorders  The patients weight, height, BMI have been recorded in the chart and visual acuity is per eye clinic.  I have made referrals, counseling and provided education to the patient based review of the above and I have provided the pt with a written personalized care plan for preventive services.  See scanned forms.  Routine anticipatory guidance given to patient.  See health maintenance. Tetanus2005 Flu already done.   Shingles 2008 PNA 2008 Colonoscopy 2010 Prostate cancer screening d/w.  No FH, no change in sx.  PSA prev low.  D/w pt about guidelines.  The natural history of prostate cancer and ongoing controversy regarding screening and potential treatment outcomes of prostate cancer has been discussed with the patient. The meaning of a false positive PSA and a false negative PSA has been discussed. He indicates understanding of the limitations of this screening test and wishes not to proceed with screening PSA testing.  Advance directive- wife would be designated.  Encouraged pt to talk to wife.    Hypertension:    Using medication without problems or lightheadedness: yes Chest pain with exertion:no Edema:no Short of breath:no  Elevated Cholesterol: Using medications without problems: tes Muscle aches:  no Diet compliance: yes Exercise:yes  Barrett's esophagus.  On PPI and doing well. No heartburn usually.  Compliant with meds. No dysphagia.  Hyperglycemia- d/w pt about cutting out candy/sweets.   Labs d/w pt.   PMH and SH reviewed  Meds, vitals, and  allergies reviewed.   ROS: See HPI.  Otherwise negative.    GEN: nad, alert and oriented HEENT: mucous membranes moist, tm wnl NECK: supple w/o LA CV: rrr. PULM: ctab, no inc wob ABD: soft, +bs EXT: no edema SKIN: no acute rash DRE deferred

## 2012-07-29 NOTE — Patient Instructions (Addendum)
Cut out the candy and sweets.   Keep exercising.  Increase the crestor to 20mg  a day.  If you get aches, then cut back to 10mg  a day.   Take care.  Recheck in 1 year.

## 2012-07-30 DIAGNOSIS — Z0000 Encounter for general adult medical examination without abnormal findings: Secondary | ICD-10-CM | POA: Insufficient documentation

## 2012-07-30 NOTE — Assessment & Plan Note (Signed)
See scanned forms.  Routine anticipatory guidance given to patient.  See health maintenance. Tetanus2005 Flu already done.   Shingles 2008 PNA 2008 Colonoscopy 2010 Prostate cancer screening d/w.  No FH, no change in sx.  PSA prev low.  D/w pt about guidelines.  The natural history of prostate cancer and ongoing controversy regarding screening and potential treatment outcomes of prostate cancer has been discussed with the patient. The meaning of a false positive PSA and a false negative PSA has been discussed. He indicates understanding of the limitations of this screening test and wishes not to proceed with screening PSA testing.  Advance directive- wife would be designated.  Encouraged pt to talk to wife.

## 2012-07-30 NOTE — Assessment & Plan Note (Signed)
Inc crestor to 20mg  a day to see if he can tolerate. If not, back down to 20mg  a day.  He agrees.  dw pt about diet and exercise.

## 2012-07-30 NOTE — Assessment & Plan Note (Signed)
No dysphagia or other complaints.  Continue PPI.

## 2012-07-30 NOTE — Assessment & Plan Note (Signed)
Controlled, continue current meds. dw pt about diet and exercise.

## 2012-07-30 NOTE — Assessment & Plan Note (Signed)
dw pt about diet and exercise.

## 2012-08-17 ENCOUNTER — Other Ambulatory Visit: Payer: Self-pay | Admitting: Family Medicine

## 2012-08-17 ENCOUNTER — Other Ambulatory Visit: Payer: Self-pay | Admitting: *Deleted

## 2012-08-17 ENCOUNTER — Other Ambulatory Visit: Payer: Self-pay | Admitting: Cardiovascular Disease

## 2012-08-17 MED ORDER — OMEPRAZOLE 40 MG PO CPDR: DELAYED_RELEASE_CAPSULE | ORAL | Status: DC

## 2012-09-08 DIAGNOSIS — J189 Pneumonia, unspecified organism: Secondary | ICD-10-CM

## 2012-09-08 HISTORY — DX: Pneumonia, unspecified organism: J18.9

## 2012-10-12 DIAGNOSIS — R51 Headache: Secondary | ICD-10-CM | POA: Diagnosis not present

## 2012-10-12 DIAGNOSIS — R918 Other nonspecific abnormal finding of lung field: Secondary | ICD-10-CM | POA: Diagnosis not present

## 2012-10-12 DIAGNOSIS — J310 Chronic rhinitis: Secondary | ICD-10-CM | POA: Diagnosis not present

## 2012-10-12 DIAGNOSIS — D72829 Elevated white blood cell count, unspecified: Secondary | ICD-10-CM | POA: Diagnosis not present

## 2012-10-12 DIAGNOSIS — J019 Acute sinusitis, unspecified: Secondary | ICD-10-CM | POA: Diagnosis not present

## 2012-10-12 DIAGNOSIS — R05 Cough: Secondary | ICD-10-CM | POA: Diagnosis not present

## 2012-10-12 DIAGNOSIS — R059 Cough, unspecified: Secondary | ICD-10-CM | POA: Diagnosis not present

## 2012-10-18 DIAGNOSIS — R05 Cough: Secondary | ICD-10-CM | POA: Diagnosis not present

## 2012-10-18 DIAGNOSIS — R059 Cough, unspecified: Secondary | ICD-10-CM | POA: Diagnosis not present

## 2012-10-18 DIAGNOSIS — R0982 Postnasal drip: Secondary | ICD-10-CM | POA: Diagnosis not present

## 2012-11-02 ENCOUNTER — Ambulatory Visit: Payer: Self-pay | Admitting: Internal Medicine

## 2012-11-02 DIAGNOSIS — R490 Dysphonia: Secondary | ICD-10-CM | POA: Diagnosis not present

## 2012-11-02 DIAGNOSIS — J310 Chronic rhinitis: Secondary | ICD-10-CM | POA: Diagnosis not present

## 2012-11-02 DIAGNOSIS — R05 Cough: Secondary | ICD-10-CM | POA: Diagnosis not present

## 2012-11-02 DIAGNOSIS — J189 Pneumonia, unspecified organism: Secondary | ICD-10-CM | POA: Diagnosis not present

## 2012-11-02 DIAGNOSIS — R0982 Postnasal drip: Secondary | ICD-10-CM | POA: Diagnosis not present

## 2012-11-02 DIAGNOSIS — R059 Cough, unspecified: Secondary | ICD-10-CM | POA: Diagnosis not present

## 2012-11-19 ENCOUNTER — Encounter: Payer: Self-pay | Admitting: Family Medicine

## 2012-11-19 ENCOUNTER — Ambulatory Visit (INDEPENDENT_AMBULATORY_CARE_PROVIDER_SITE_OTHER)
Admission: RE | Admit: 2012-11-19 | Discharge: 2012-11-19 | Disposition: A | Discharge status: Home / Self Care | Payer: Medicare Other | Source: Ambulatory Visit | Attending: Family Medicine | Admitting: Family Medicine

## 2012-11-19 ENCOUNTER — Ambulatory Visit (INDEPENDENT_AMBULATORY_CARE_PROVIDER_SITE_OTHER): Payer: Medicare Other | Admitting: Family Medicine

## 2012-11-19 VITALS — BP 142/72 | HR 69 | Temp 98.0°F | Wt 186.0 lb

## 2012-11-19 DIAGNOSIS — J40 Bronchitis, not specified as acute or chronic: Secondary | ICD-10-CM | POA: Diagnosis not present

## 2012-11-19 DIAGNOSIS — J189 Pneumonia, unspecified organism: Secondary | ICD-10-CM

## 2012-11-19 LAB — CBC WITH DIFFERENTIAL/PLATELET
Basophils Absolute: 0 10*3/uL (ref 0.0–0.1)
Basophils Relative: 0 % (ref 0.0–3.0)
Eosinophils Absolute: 0.1 10*3/uL (ref 0.0–0.7)
Eosinophils Relative: 1 % (ref 0.0–5.0)
HCT: 41.8 % (ref 39.0–52.0)
Hemoglobin: 14 g/dL (ref 13.0–17.0)
Lymphocytes Relative: 8.6 % — ABNORMAL LOW (ref 12.0–46.0)
Lymphs Abs: 1.3 10*3/uL (ref 0.7–4.0)
MCHC: 33.6 g/dL (ref 30.0–36.0)
MCV: 91 fl (ref 78.0–100.0)
Monocytes Absolute: 1.3 10*3/uL — ABNORMAL HIGH (ref 0.1–1.0)
Monocytes Relative: 8.7 % (ref 3.0–12.0)
Neutro Abs: 12.6 10*3/uL — ABNORMAL HIGH (ref 1.4–7.7)
Neutrophils Relative %: 81.7 % — ABNORMAL HIGH (ref 43.0–77.0)
Platelets: 183 10*3/uL (ref 150.0–400.0)
RBC: 4.59 Mil/uL (ref 4.22–5.81)
RDW: 13.5 % (ref 11.5–14.6)
WBC: 15.4 10*3/uL — ABNORMAL HIGH (ref 4.5–10.5)

## 2012-11-19 MED ORDER — BENZONATATE 100 MG PO CAPS: 100.0000 mg | ORAL_CAPSULE | Freq: Three times a day (TID) | ORAL | Status: DC | PRN

## 2012-11-19 NOTE — Patient Instructions (Signed)
Go to the lab on the way out.  We'll contact you with your lab report.  We'll be in touch.

## 2012-11-19 NOTE — Progress Notes (Signed)
Seen with a cough at outside facility early 2/14.  CXR done and dx'd with PNA.  Started on levaquin and treated as an oupatient. He was improving some, but not all the way back to baseline.  Rechecked, with no fever and WBC wnl per patient.  Recheck CXR with possible PNA, restarted on levaquin.  He isn't better or worse now, but but isn't fully back to baseline.  Cough continues, no fevers.  Some sputum occ, light yellow. Occ blood streaked.  Not SOB.  Deep breath triggers a cough.  No aches or pains except for occ L chest wall soreness likely from cough.  Never smoker.    Prev initial CXR image reviewed along with reports on both CXRs.   Meds, vitals, and allergies reviewed.   ROS: See HPI.  Otherwise, noncontributory.  nad ncat Tm wnl Nasal and op exam wnl Neck supple, no LA rrr ctab but dry cough noted abd soft, not ttp Ext w/o edema

## 2012-11-19 NOTE — Assessment & Plan Note (Signed)
Resolved by CXR with old scarring noted.  Inc in WBC noted but clinically appears well except for the cough.  Will use tessalon for cough in meantime and he'll update me next week.  F/u cautions given, understood.  If cough continues to linger, we'll need to consider further w/u.  Will await patient update.  >25 min spent with face to face with patient, >50% counseling and/or coordinating care

## 2012-11-22 ENCOUNTER — Telehealth: Payer: Self-pay

## 2012-11-22 MED ORDER — HYDROCODONE-HOMATROPINE 5-1.5 MG/5ML PO SYRP: 5.0000 mL | ORAL_SOLUTION | Freq: Three times a day (TID) | ORAL | Status: DC | PRN

## 2012-11-22 NOTE — Telephone Encounter (Signed)
Pt's wife left v/m pt does not have fever or SOB. pts cough is no better and Tessalon not helping. Request cough med sent to [facility]. Request call back.

## 2012-11-22 NOTE — Telephone Encounter (Signed)
Rx phoned to pharmacy.  LMOVM letting pt know that Rx has been called in.

## 2012-11-22 NOTE — Telephone Encounter (Signed)
Would try hycodan for the cough.  He had a possible reaction to morphine, but should be able to tolerate this.  If this isn't helping, I'd like to recheck him later in the week.  Thanks. Please call in it.  Added to med list.

## 2012-11-24 ENCOUNTER — Encounter: Payer: Self-pay | Admitting: Cardiovascular Disease

## 2012-11-24 ENCOUNTER — Ambulatory Visit (INDEPENDENT_AMBULATORY_CARE_PROVIDER_SITE_OTHER): Payer: Medicare Other | Admitting: Cardiovascular Disease

## 2012-11-24 VITALS — BP 142/82 | HR 73 | Ht 68.0 in | Wt 186.8 lb

## 2012-11-24 DIAGNOSIS — E7800 Pure hypercholesterolemia, unspecified: Secondary | ICD-10-CM | POA: Diagnosis not present

## 2012-11-24 DIAGNOSIS — I10 Essential (primary) hypertension: Secondary | ICD-10-CM

## 2012-11-24 DIAGNOSIS — E785 Hyperlipidemia, unspecified: Secondary | ICD-10-CM

## 2012-11-24 DIAGNOSIS — R079 Chest pain, unspecified: Secondary | ICD-10-CM

## 2012-11-24 DIAGNOSIS — I2510 Atherosclerotic heart disease of native coronary artery without angina pectoris: Secondary | ICD-10-CM

## 2012-11-24 NOTE — Patient Instructions (Addendum)
You are doing well. No medication changes were made.  Continue crestor 20 mg daily Watch the weight  Please call us if you have new issues that need to be addressed before your next appt.  Your physician wants you to follow-up in: 6 months.  You will receive a reminder letter in the mail two months in advance. If you don't receive a letter, please call our office to schedule the follow-up appointment.

## 2012-11-24 NOTE — Assessment & Plan Note (Signed)
We have suggested to him that he take Crestor 20 mg daily, lose several pounds. Goal LDL less than 70, currently he is 100

## 2012-11-24 NOTE — Assessment & Plan Note (Signed)
Blood pressure is well controlled on today's visit. No changes made to the medications. 

## 2012-11-24 NOTE — Progress Notes (Signed)
Patient ID: Barry Small, male    DOB: 04/28/1934, 79 y.o.   MRN: [id number]  HPI Comments: [name] is a pleasant 79 year old gentleman with a history of coronary artery disease, 4 vessel bypass in 1989 at [facility], history of stenting in 2007 after presenting to [facility] with epigastric and chest discomfort, presented for routine followup.   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 Recent pneumonia in February 2014, still coughing but getting better. Taking strong cough syrup which helps.   Blood pressure at home is in the 130-140 range.he has not been exercising, weight slowly trending upward, taking Crestor one half dose with a full does intermittently.   Recent LDL of 100  His last stress nuclear study was in June of 2009. EF was normal with an old inferior wall infarct scar. There was no ischemia.   EKG shows normal sinus rhythm with rate 73 beats per minute, no significant ST-T wave changes. No significant ST or T wave changes      Outpatient Encounter Prescriptions as of 11/24/2012  Medication Sig Dispense Refill  . aspirin 81 MG EC tablet Take 81 mg by mouth daily.       . cholecalciferol (VITAMIN D) 1000 UNITS tablet Take 2,000 units per day.      . fluticasone (FLONASE) 50 MCG/ACT nasal spray Place 1 spray into the nose daily.       Marland Kitchen HYDROcodone-homatropine (HYCODAN) 5-1.5 MG/5ML syrup Take 5 mLs by mouth every 8 (eight) hours as needed for cough (sedation caution).  120 mL  0  . lisinopril (PRINIVIL,ZESTRIL) 20 MG tablet Take one half (10mg ) tablet by mouth in the morning and one half (10mg ) tablet by mouth in the evening.      . metoprolol tartrate (LOPRESSOR) 25 MG tablet TAKE ONE TABLET BY MOUTH TWICE DAILY  180 tablet  3  . Multiple Vitamin (MULTIVITAMIN) tablet Take 1 tablet by mouth daily.        Marland Kitchen NITROSTAT 0.4 MG SL tablet DISSOLVE ONE TABLET UNDER TONGUE EVERY 5 MINUTES AS NEEDED FOR CHESTPAIN  25 tablet  2  . omeprazole (PRILOSEC) 40 MG capsule  Take one capsule by mouth once daily 30 minutes before a meal as needed.  90 capsule  3  . ranitidine (ZANTAC) 150 MG tablet Take 150 mg by mouth as needed.      . rosuvastatin (CRESTOR) 20 MG tablet Take 1 tablet (20 mg total) by mouth daily.       No facility-administered encounter medications on file as of 11/24/2012.     Review of Systems  Constitutional: Negative.   HENT: Negative.   Eyes: Negative.   Respiratory: Negative.   Cardiovascular: Negative.   Gastrointestinal: Negative.   Musculoskeletal: Negative.   Skin: Negative.   Neurological: Negative.   Psychiatric/Behavioral: Negative.   All other systems reviewed and are negative.   BP 142/82  Pulse 73  Ht 5\' 8"  (1.727 m)  Wt 186 lb 12 oz (84.709 kg)  BMI 28.4 kg/m2  Physical Exam  Nursing note and vitals reviewed. Constitutional: He is oriented to person, place, and time. He appears well-developed and well-nourished.  HENT:  Head: Normocephalic.  Nose: Nose normal.  Mouth/Throat: Oropharynx is clear and moist.  Eyes: Conjunctivae are normal. Pupils are equal, round, and reactive to light.  Neck: Normal range of motion. Neck supple. No JVD present.  Cardiovascular: Normal rate, regular rhythm, S1 normal, S2 normal and intact distal pulses.  Exam reveals no gallop and no friction rub.   Murmur heard.  Crescendo systolic murmur is present with a grade of 2/6  Pulmonary/Chest: Effort normal and breath sounds normal. No respiratory distress. He has no wheezes. He has no rales. He exhibits no tenderness.  Abdominal: Soft. Bowel sounds are normal. He exhibits no distension. There is no tenderness.  Musculoskeletal: Normal range of motion. He exhibits no edema and no tenderness.  Lymphadenopathy:    He has no cervical adenopathy.  Neurological: He is alert and oriented to person, place, and time. Coordination normal.  Skin: Skin is warm and dry. No rash noted. No erythema.  Psychiatric: He has a normal mood and affect.  His behavior is normal. Judgment and thought content normal.      Assessment and Plan

## 2012-11-24 NOTE — Assessment & Plan Note (Signed)
Vague left-sided chest pain from coughing, likely not angina. We have asked him to call us if symptoms get worse.

## 2012-12-03 ENCOUNTER — Encounter: Payer: Self-pay | Admitting: Family Medicine

## 2013-01-07 ENCOUNTER — Telehealth: Payer: Self-pay | Admitting: *Deleted

## 2013-01-07 ENCOUNTER — Ambulatory Visit (INDEPENDENT_AMBULATORY_CARE_PROVIDER_SITE_OTHER): Payer: Medicare Other

## 2013-01-07 VITALS — BP 150/80 | HR 66 | Ht 68.0 in | Wt 180.8 lb

## 2013-01-07 DIAGNOSIS — R0602 Shortness of breath: Secondary | ICD-10-CM | POA: Diagnosis not present

## 2013-01-07 NOTE — Telephone Encounter (Signed)
Pt concerned about some shoulder pain he is having Says he had bilateral shoulder pain with last stent and is having the pain again He tells me he has hx bursitis in shoulders with some arthritis and is not sure if this is where pain is coming from Denies worsening sob or DOE Denies chest pain/discomfort Says shoulder pain is worse when he awakes in am but improves through day.   He took aleve this am and feels some improvement Says he is anxious about this and would like [place] to check BP and EKG on him after he gets off work I reassured him this sounds musculoskeletal versus cardiac but would be glad to do EKG/BP He will come in today at 2 pm for EKG

## 2013-01-07 NOTE — Telephone Encounter (Signed)
Pt wife called stating that he is having some pains in his shoulder. States he is "not feeling right"

## 2013-01-07 NOTE — Progress Notes (Signed)
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 Mariah Milling Per Dr. Mariah Milling, EKG is normal and is unchanged from previous.  He feels pain pt is feeling is musculoskeletal in nature Pt was reassured

## 2013-01-07 NOTE — Patient Instructions (Addendum)
Continue current regimen

## 2013-01-26 ENCOUNTER — Other Ambulatory Visit (INDEPENDENT_AMBULATORY_CARE_PROVIDER_SITE_OTHER): Payer: Medicare Other

## 2013-01-26 ENCOUNTER — Encounter: Payer: Self-pay | Admitting: Family Medicine

## 2013-01-26 DIAGNOSIS — E785 Hyperlipidemia, unspecified: Secondary | ICD-10-CM

## 2013-01-27 LAB — LIPID PANEL
Chol/HDL Ratio: 3.4 ratio (ref 0.0–5.0)
Cholesterol, Total: 140 mg/dL (ref 100–199)
HDL: 41 mg/dL
LDL Calculated: 81 mg/dL (ref 0–99)
Triglycerides: 91 mg/dL (ref 0–149)
VLDL Cholesterol Cal: 18 mg/dL (ref 5–40)

## 2013-02-02 ENCOUNTER — Encounter: Payer: Self-pay | Admitting: Family Medicine

## 2013-04-20 DIAGNOSIS — G541 Lumbosacral plexus disorders: Secondary | ICD-10-CM | POA: Diagnosis not present

## 2013-04-20 DIAGNOSIS — G542 Cervical root disorders, not elsewhere classified: Secondary | ICD-10-CM | POA: Diagnosis not present

## 2013-04-20 DIAGNOSIS — M999 Biomechanical lesion, unspecified: Secondary | ICD-10-CM | POA: Diagnosis not present

## 2013-04-20 DIAGNOSIS — M9981 Other biomechanical lesions of cervical region: Secondary | ICD-10-CM | POA: Diagnosis not present

## 2013-05-02 DIAGNOSIS — M9981 Other biomechanical lesions of cervical region: Secondary | ICD-10-CM | POA: Diagnosis not present

## 2013-05-02 DIAGNOSIS — G542 Cervical root disorders, not elsewhere classified: Secondary | ICD-10-CM | POA: Diagnosis not present

## 2013-05-02 DIAGNOSIS — M999 Biomechanical lesion, unspecified: Secondary | ICD-10-CM | POA: Diagnosis not present

## 2013-05-02 DIAGNOSIS — G541 Lumbosacral plexus disorders: Secondary | ICD-10-CM | POA: Diagnosis not present

## 2013-05-03 DIAGNOSIS — G541 Lumbosacral plexus disorders: Secondary | ICD-10-CM | POA: Diagnosis not present

## 2013-05-03 DIAGNOSIS — M999 Biomechanical lesion, unspecified: Secondary | ICD-10-CM | POA: Diagnosis not present

## 2013-05-03 DIAGNOSIS — G542 Cervical root disorders, not elsewhere classified: Secondary | ICD-10-CM | POA: Diagnosis not present

## 2013-05-03 DIAGNOSIS — M9981 Other biomechanical lesions of cervical region: Secondary | ICD-10-CM | POA: Diagnosis not present

## 2013-05-11 ENCOUNTER — Encounter: Payer: Self-pay | Admitting: Family Medicine

## 2013-05-13 ENCOUNTER — Other Ambulatory Visit: Payer: Self-pay | Admitting: Cardiovascular Disease

## 2013-05-13 NOTE — Telephone Encounter (Signed)
Refilled Lisinopril, Crestor, Metoprolol sent to [facility].

## 2013-05-23 ENCOUNTER — Encounter: Payer: Self-pay | Admitting: Cardiovascular Disease

## 2013-05-23 ENCOUNTER — Ambulatory Visit (INDEPENDENT_AMBULATORY_CARE_PROVIDER_SITE_OTHER): Payer: Medicare Other | Admitting: Cardiovascular Disease

## 2013-05-23 VITALS — BP 140/80 | HR 61 | Ht 68.0 in | Wt 178.5 lb

## 2013-05-23 DIAGNOSIS — I252 Old myocardial infarction: Secondary | ICD-10-CM

## 2013-05-23 DIAGNOSIS — E7800 Pure hypercholesterolemia, unspecified: Secondary | ICD-10-CM | POA: Diagnosis not present

## 2013-05-23 DIAGNOSIS — I2510 Atherosclerotic heart disease of native coronary artery without angina pectoris: Secondary | ICD-10-CM

## 2013-05-23 DIAGNOSIS — I10 Essential (primary) hypertension: Secondary | ICD-10-CM | POA: Diagnosis not present

## 2013-05-23 NOTE — Assessment & Plan Note (Signed)
Cholesterol is at goal on the current lipid regimen. No changes to the medications were made.  

## 2013-05-23 NOTE — Progress Notes (Signed)
Patient ID: Barry Small, male    DOB: 1934/07/02, 79 y.o.   MRN: [id number]  HPI Comments: Barry Small is a pleasant 79 year old gentleman with a history of coronary artery disease, 4 vessel bypass in 1989 at [facility], history of stenting in 2007 after presenting to [facility] with epigastric and chest discomfort, presented for routine followup.  Since his last clinic visit, his weight has dropped from 187 pounds down to 178 pounds. 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  Previous pneumonia in February 2014   Blood pressure at home is in the 130-140 range he has not been exercising. He is tolerating cholesterol medication with no symptoms  His last stress nuclear study was in June of 2009. EF was normal with an old inferior wall infarct scar. There was no ischemia.   EKG shows normal sinus rhythm with rate 61 beats per minute, no significant ST-T wave changes. Consider old inferior MI      Outpatient Encounter Prescriptions as of 05/23/2013  Medication Sig Dispense Refill  . aspirin 81 MG EC tablet Take 81 mg by mouth daily.       . cholecalciferol (VITAMIN D) 1000 UNITS tablet Take 2,000 units per day.      . fluticasone (FLONASE) 50 MCG/ACT nasal spray Place 1 spray into the nose daily.       Marland Kitchen lisinopril (PRINIVIL,ZESTRIL) 20 MG tablet Take one tablet by mouth one time daily  90 tablet  2  . metoprolol tartrate (LOPRESSOR) 25 MG tablet Take one tablet by mouth twice daily  180 tablet  2  . Multiple Vitamin (MULTIVITAMIN) tablet Take 1 tablet by mouth daily.        Marland Kitchen NITROSTAT 0.4 MG SL tablet DISSOLVE ONE TABLET UNDER TONGUE EVERY 5 MINUTES AS NEEDED FOR CHESTPAIN  25 tablet  2  . omeprazole (PRILOSEC) 40 MG capsule Take one capsule by mouth once daily 30 minutes before a meal as needed.  90 capsule  3  . rosuvastatin (CRESTOR) 20 MG tablet Take 1 tablet (20 mg total) by mouth daily.  30 tablet  3   Review of Systems   Constitutional: Negative.   HENT: Negative.   Eyes: Negative.   Respiratory: Negative.   Cardiovascular: Negative.   Gastrointestinal: Negative.   Endocrine: Negative.   Musculoskeletal: Negative.   Skin: Negative.   Allergic/Immunologic: Negative.   Neurological: Negative.   Hematological: Negative.   Psychiatric/Behavioral: Negative.   All other systems reviewed and are negative.   BP 140/80  Pulse 61  Ht 5\' 8"  (1.727 m)  Wt 178 lb 8 oz (80.967 kg)  BMI 27.15 kg/m2  Physical Exam  Nursing note and vitals reviewed. Constitutional: He is oriented to person, place, and time. He appears well-developed and well-nourished.  HENT:  Head: Normocephalic.  Nose: Nose normal.  Mouth/Throat: Oropharynx is clear and moist.  Eyes: Conjunctivae are normal. Pupils are equal, round, and reactive to light.  Neck: Normal range of motion. Neck supple. No JVD present.  Cardiovascular: Normal rate, regular rhythm, S1 normal, S2 normal and intact distal pulses.  Exam reveals no gallop and no friction rub.   Murmur heard.  Crescendo systolic murmur is present with a grade of 2/6  Pulmonary/Chest: Effort normal and breath sounds normal. No respiratory distress. He has no wheezes. He has no rales. He exhibits no tenderness.  Abdominal: Soft. Bowel sounds are normal. He exhibits no  distension. There is no tenderness.  Musculoskeletal: Normal range of motion. He exhibits no edema and no tenderness.  Lymphadenopathy:    He has no cervical adenopathy.  Neurological: He is alert and oriented to person, place, and time. Coordination normal.  Skin: Skin is warm and dry. No rash noted. No erythema.  Psychiatric: He has a normal mood and affect. His behavior is normal. Judgment and thought content normal.      Assessment and Plan

## 2013-05-23 NOTE — Assessment & Plan Note (Signed)
Blood pressure is well controlled on today's visit. No changes made to the medications. 

## 2013-05-23 NOTE — Assessment & Plan Note (Signed)
Currently with no symptoms of angina. No further workup at this time. Continue current medication regimen. 

## 2013-05-23 NOTE — Patient Instructions (Addendum)
You are doing well. Please change the metoprolol to a 1/2 pill in the Am and PM  Please call us if you have new issues that need to be addressed before your next appt.  Your physician wants you to follow-up in: 6 months.  You will receive a reminder letter in the mail two months in advance. If you don't receive a letter, please call our office to schedule the follow-up appointment.

## 2013-05-24 DIAGNOSIS — J301 Allergic rhinitis due to pollen: Secondary | ICD-10-CM | POA: Diagnosis not present

## 2013-05-24 DIAGNOSIS — J309 Allergic rhinitis, unspecified: Secondary | ICD-10-CM | POA: Diagnosis not present

## 2013-05-24 DIAGNOSIS — L0201 Cutaneous abscess of face: Secondary | ICD-10-CM | POA: Diagnosis not present

## 2013-06-02 DIAGNOSIS — L570 Actinic keratosis: Secondary | ICD-10-CM | POA: Diagnosis not present

## 2013-06-02 DIAGNOSIS — C4401 Basal cell carcinoma of skin of lip: Secondary | ICD-10-CM | POA: Diagnosis not present

## 2013-06-10 DIAGNOSIS — Z23 Encounter for immunization: Secondary | ICD-10-CM | POA: Diagnosis not present

## 2013-07-07 DIAGNOSIS — C4401 Basal cell carcinoma of skin of lip: Secondary | ICD-10-CM | POA: Diagnosis not present

## 2013-07-07 DIAGNOSIS — Z85828 Personal history of other malignant neoplasm of skin: Secondary | ICD-10-CM | POA: Diagnosis not present

## 2013-07-14 ENCOUNTER — Other Ambulatory Visit: Payer: Self-pay

## 2013-08-17 DIAGNOSIS — H2510 Age-related nuclear cataract, unspecified eye: Secondary | ICD-10-CM | POA: Diagnosis not present

## 2013-08-24 DIAGNOSIS — M67919 Unspecified disorder of synovium and tendon, unspecified shoulder: Secondary | ICD-10-CM | POA: Diagnosis not present

## 2013-10-04 ENCOUNTER — Encounter: Payer: Self-pay | Admitting: Gastroenterology

## 2013-10-06 ENCOUNTER — Other Ambulatory Visit: Payer: Self-pay | Admitting: Family Medicine

## 2013-10-06 DIAGNOSIS — I252 Old myocardial infarction: Secondary | ICD-10-CM

## 2013-10-14 ENCOUNTER — Other Ambulatory Visit (INDEPENDENT_AMBULATORY_CARE_PROVIDER_SITE_OTHER): Payer: Medicare Other

## 2013-10-14 DIAGNOSIS — I252 Old myocardial infarction: Secondary | ICD-10-CM

## 2013-10-14 DIAGNOSIS — E7800 Pure hypercholesterolemia, unspecified: Secondary | ICD-10-CM | POA: Diagnosis not present

## 2013-10-14 DIAGNOSIS — I10 Essential (primary) hypertension: Secondary | ICD-10-CM | POA: Diagnosis not present

## 2013-10-17 LAB — LIPID PANEL
Cholesterol: 154 mg/dL (ref 0–200)
HDL: 42.9 mg/dL (ref >39.00)
LDL Cholesterol: 94 mg/dL (ref 0–99)
Total CHOL/HDL Ratio: 4
Triglycerides: 84 mg/dL (ref 0.0–149.0)
VLDL: 16.8 mg/dL (ref 0.0–40.0)

## 2013-10-17 LAB — COMPREHENSIVE METABOLIC PANEL
ALT: 21 U/L (ref 0–53)
AST: 25 U/L (ref 0–37)
Albumin: 4.5 g/dL (ref 3.5–5.2)
Alkaline Phosphatase: 46 U/L (ref 39–117)
BUN: 13 mg/dL (ref 6–23)
CO2: 28 mEq/L (ref 19–32)
Calcium: 9.6 mg/dL (ref 8.4–10.5)
Chloride: 101 mEq/L (ref 96–112)
Creatinine, Ser: 0.8 mg/dL (ref 0.4–1.5)
GFR: 99.53 mL/min (ref >60.00)
Glucose, Bld: 95 mg/dL (ref 70–99)
Potassium: 4.7 mEq/L (ref 3.5–5.1)
Sodium: 134 mEq/L — ABNORMAL LOW (ref 135–145)
Total Bilirubin: 1.1 mg/dL (ref 0.3–1.2)
Total Protein: 7.2 g/dL (ref 6.0–8.3)

## 2013-10-18 DIAGNOSIS — J301 Allergic rhinitis due to pollen: Secondary | ICD-10-CM | POA: Diagnosis not present

## 2013-10-18 DIAGNOSIS — T169XXA Foreign body in ear, unspecified ear, initial encounter: Secondary | ICD-10-CM | POA: Diagnosis not present

## 2013-10-20 ENCOUNTER — Encounter: Payer: Self-pay | Admitting: Family Medicine

## 2013-10-21 ENCOUNTER — Encounter: Payer: Self-pay | Admitting: Family Medicine

## 2013-10-21 ENCOUNTER — Ambulatory Visit (INDEPENDENT_AMBULATORY_CARE_PROVIDER_SITE_OTHER): Payer: Medicare Other | Admitting: Family Medicine

## 2013-10-21 VITALS — BP 128/82 | HR 67 | Temp 97.5°F | Ht 66.5 in | Wt 182.0 lb

## 2013-10-21 DIAGNOSIS — E7800 Pure hypercholesterolemia, unspecified: Secondary | ICD-10-CM | POA: Diagnosis not present

## 2013-10-21 DIAGNOSIS — R251 Tremor, unspecified: Secondary | ICD-10-CM | POA: Insufficient documentation

## 2013-10-21 DIAGNOSIS — K2270 Barrett's esophagus without dysplasia: Secondary | ICD-10-CM

## 2013-10-21 DIAGNOSIS — Z0000 Encounter for general adult medical examination without abnormal findings: Secondary | ICD-10-CM

## 2013-10-21 DIAGNOSIS — I10 Essential (primary) hypertension: Secondary | ICD-10-CM | POA: Diagnosis not present

## 2013-10-21 DIAGNOSIS — R259 Unspecified abnormal involuntary movements: Secondary | ICD-10-CM

## 2013-10-21 DIAGNOSIS — R7309 Other abnormal glucose: Secondary | ICD-10-CM

## 2013-10-21 NOTE — Patient Instructions (Signed)
Call GI about follow up.   Let me know if the tremor gets worse.  Don't change your meds.   Glad to see you.  Take care.

## 2013-10-21 NOTE — Assessment & Plan Note (Signed)
Controlled, continue current meds. Labs d/w pt.  

## 2013-10-21 NOTE — Progress Notes (Signed)
Pre-visit discussion using our clinic review tool. No additional management support is needed unless otherwise documented below in the visit note. Wife says she wants to alert Dr. Damita Dunnings that patient's shaking is getting worse.  I have personally reviewed the Medicare Annual Wellness questionnaire and have noted  1. The patient's medical and social history  2. Their use of alcohol, tobacco or illicit drugs  3. Their current medications and supplements  4. The patient's functional ability including ADL's, fall risks, home safety risks and hearing or visual  impairment.  5. Diet and physical activities  6. Evidence for depression or mood disorders   The patients weight, height, BMI have been recorded in the chart and visual acuity is per eye clinic.  I have made referrals, counseling and provided education to the patient based review of the above and I have provided the pt with a written personalized care plan for preventive services.   See scanned forms. Routine anticipatory guidance given to patient. See health maintenance.  Tetanus 2005  Flu 06/2013 done Shingles 2008  PNA 2008  Colonoscopy 2010  Prostate cancer screening and PSA options (with potential risks and benefits of testing vs not testing) were discussed along with recent recs/guidelines.  He declined testing PSA at this point. Advance directive- wife would be designated. Encouraged pt to talk to wife.    B hand tremor noted, no jaw tremor.  No other tremor.  No focal neuro sx o/w.  No known FH.  Intermittent sx. Not bothersome to patient.    Hypertension:  Using medication without problems or lightheadedness: yes  Chest pain with exertion:no  Edema:no  Short of breath:no  He cut out salt.   Elevated Cholesterol:  Using medications without problems: yes Muscle aches: no  Diet compliance: yes  Exercise:yes   Barrett's esophagus. On PPI prn and doing well. No heartburn usually. Compliant with meds. No dysphagia.  He has  worked on his diet a lot, cut out fried foods.   H/o hyperglycemia- improved on recent labs.   PMH and SH reviewed   Meds, vitals, and allergies reviewed.   ROS: See HPI. Otherwise negative.   GEN: nad, alert and oriented  HEENT: mucous membranes moist, tm wnl  NECK: supple w/o LA  CV: rrr.  PULM: ctab, no inc wob  ABD: soft, +bs  EXT: no edema  SKIN: no acute rash  B mild tremor noted.  Gait, speech, smile wnl

## 2013-10-21 NOTE — Assessment & Plan Note (Signed)
Minimal.  We can refer to neuro if needed. D/w pt. It isn't bothering him enough to do anything about it now.  He'll notify me if worse.

## 2013-10-21 NOTE — Assessment & Plan Note (Signed)
See scanned forms. Routine anticipatory guidance given to patient. See health maintenance.  Tetanus 2005  Flu 06/2013 done Shingles 2008  PNA 2008  Colonoscopy 2010  Prostate cancer screening and PSA options (with potential risks and benefits of testing vs not testing) were discussed along with recent recs/guidelines.  He declined testing PSA at this point. Advance directive- wife would be designated. Encouraged pt to talk to wife.

## 2013-10-21 NOTE — Assessment & Plan Note (Signed)
Improved with diet changes. 

## 2013-10-21 NOTE — Assessment & Plan Note (Signed)
On prn PPI, doing well.  I asked him to talk to GI about this.  He agrees.

## 2013-10-24 ENCOUNTER — Telehealth: Payer: Self-pay | Admitting: Family Medicine

## 2013-10-24 NOTE — Telephone Encounter (Signed)
Relevant patient education assigned to patient using Emmi. ° °

## 2013-11-11 ENCOUNTER — Other Ambulatory Visit: Payer: Self-pay | Admitting: Family Medicine

## 2013-11-21 ENCOUNTER — Encounter: Payer: Self-pay | Admitting: Cardiovascular Disease

## 2013-11-21 ENCOUNTER — Ambulatory Visit (INDEPENDENT_AMBULATORY_CARE_PROVIDER_SITE_OTHER): Payer: Medicare Other | Admitting: Cardiovascular Disease

## 2013-11-21 ENCOUNTER — Other Ambulatory Visit: Payer: Self-pay | Admitting: Cardiovascular Disease

## 2013-11-21 VITALS — BP 150/80 | HR 63 | Ht 67.0 in | Wt 184.2 lb

## 2013-11-21 DIAGNOSIS — I2510 Atherosclerotic heart disease of native coronary artery without angina pectoris: Secondary | ICD-10-CM

## 2013-11-21 DIAGNOSIS — I10 Essential (primary) hypertension: Secondary | ICD-10-CM

## 2013-11-21 DIAGNOSIS — E7800 Pure hypercholesterolemia, unspecified: Secondary | ICD-10-CM | POA: Diagnosis not present

## 2013-11-21 MED ORDER — ROSUVASTATIN CALCIUM 20 MG PO TABS: 20.0000 mg | ORAL_TABLET | Freq: Every day | ORAL | Status: DC

## 2013-11-21 MED ORDER — OMEPRAZOLE 20 MG PO CPDR: 20.0000 mg | DELAYED_RELEASE_CAPSULE | Freq: Two times a day (BID) | ORAL | Status: DC

## 2013-11-21 NOTE — Assessment & Plan Note (Signed)
Recommended he closely monitor his blood pressure. Elevated in the office but improved at home

## 2013-11-21 NOTE — Assessment & Plan Note (Signed)
LDL slightly above goal. We have recommended that he increase the Crestor back to 20 mg daily. He is only taking 10 mg. He is worried about memory loss. We have suggested he try this for a short period of time

## 2013-11-21 NOTE — Patient Instructions (Addendum)
You are doing well. Please take crestor 20 mg every day  Please call us if you have new issues that need to be addressed before your next appt.  Your physician wants you to follow-up in: 6 months.  You will receive a reminder letter in the mail two months in advance. If you don't receive a letter, please call our office to schedule the follow-up appointment.

## 2013-11-21 NOTE — Progress Notes (Signed)
Patient ID: Barry Small, male    DOB: 08/30/1934, 80 y.o.   MRN: [id number]  HPI Comments: Barry Small is a pleasant 80 year old gentleman with a history of coronary artery disease, 4 vessel bypass in 1989 at [facility], history of stenting in 2007 after presenting to [facility] with epigastric and chest discomfort, presented for routine followup.  In followup today, he is active, no complaints of shortness of breath or chest pain. Reports his blood pressure is well controlled at home with systolic pressure typically in the 120-130 range. He reports that he is doing well with no changes. 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  Previous pneumonia in February 2014 he has not been exercising. He is tolerating cholesterol medication with no symptoms  His last stress nuclear study was in June of 2009. EF was normal with an old inferior wall infarct scar. There was no ischemia.   EKG shows normal sinus rhythm with rate 63 beats per minute, no significant ST-T wave changes. Consider old inferior MI      Outpatient Encounter Prescriptions as of 11/21/2013  Medication Sig  . aspirin 81 MG EC tablet Take 81 mg by mouth daily.   . cholecalciferol (VITAMIN D) 1000 UNITS tablet Take 2,000 units per day.  . Coenzyme Q10 (CO Q-10) 100 MG CAPS Take 300 mg by mouth daily.  . Cyanocobalamin (VITAMIN B-12) 5000 MCG SUBL Place under the tongue daily.  . fluticasone (FLONASE) 50 MCG/ACT nasal spray Place 1 spray into the nose daily.   Marland Kitchen lisinopril (PRINIVIL,ZESTRIL) 20 MG tablet Take one half  tablet by mouth one time daily  . metoprolol tartrate (LOPRESSOR) 25 MG tablet Take one half  tablet by mouth twice daily  . Multiple Vitamin (MULTIVITAMIN) tablet Take 1 tablet by mouth daily.    Marland Kitchen NITROSTAT 0.4 MG SL tablet DISSOLVE ONE TABLET UNDER TONGUE EVERY 5 MINUTES AS NEEDED FOR CHESTPAIN  . omeprazole (PRILOSEC) 40 MG capsule Take one capsule by mouth one time daily  take 30 minutes before meal  as needed  . Probiotic Product (PROBIOTIC DAILY PO) Take by mouth daily.  . ranitidine (ZANTAC) 150 MG tablet Take 150 mg by mouth 2 (two) times daily as needed for heartburn.  . rosuvastatin (CRESTOR) 20 MG tablet Take 1 tablet (20 mg total) by mouth daily.   Review of Systems  Constitutional: Negative.   HENT: Negative.   Eyes: Negative.   Respiratory: Negative.   Cardiovascular: Negative.   Gastrointestinal: Negative.   Endocrine: Negative.   Musculoskeletal: Negative.   Skin: Negative.   Allergic/Immunologic: Negative.   Neurological: Negative.   Hematological: Negative.   Psychiatric/Behavioral: Negative.   All other systems reviewed and are negative.   BP 150/80  Pulse 63  Ht 5\' 7"  (1.702 m)  Wt 184 lb 4 oz (83.575 kg)  BMI 28.85 kg/m2  Physical Exam  Nursing note and vitals reviewed. Constitutional: He is oriented to person, place, and time. He appears well-developed and well-nourished.  HENT:  Head: Normocephalic.  Nose: Nose normal.  Mouth/Throat: Oropharynx is clear and moist.  Eyes: Conjunctivae are normal. Pupils are equal, round, and reactive to light.  Neck: Normal range of motion. Neck supple. No JVD present.  Cardiovascular: Normal rate, regular rhythm, S1 normal, S2 normal and intact distal pulses.  Exam reveals no gallop and no friction rub.   Murmur heard.  Crescendo systolic murmur is present with a grade of 2/6  Pulmonary/Chest: Effort normal and breath  sounds normal. No respiratory distress. He has no wheezes. He has no rales. He exhibits no tenderness.  Abdominal: Soft. Bowel sounds are normal. He exhibits no distension. There is no tenderness.  Musculoskeletal: Normal range of motion. He exhibits no edema and no tenderness.  Lymphadenopathy:    He has no cervical adenopathy.  Neurological: He is alert and oriented to person, place, and time. Coordination normal.  Skin: Skin is warm and dry. No rash noted. No erythema.  Psychiatric: He has a  normal mood and affect. His behavior is normal. Judgment and thought content normal.      Assessment and Plan

## 2013-11-21 NOTE — Assessment & Plan Note (Signed)
Currently with no symptoms of angina. No further workup at this time. Continue current medication regimen. 

## 2014-02-20 DIAGNOSIS — R300 Dysuria: Secondary | ICD-10-CM | POA: Diagnosis not present

## 2014-03-22 DIAGNOSIS — H04129 Dry eye syndrome of unspecified lacrimal gland: Secondary | ICD-10-CM | POA: Diagnosis not present

## 2014-05-22 ENCOUNTER — Ambulatory Visit: Payer: No Typology Code available for payment source | Admitting: Cardiovascular Disease

## 2014-05-26 ENCOUNTER — Encounter: Payer: Self-pay | Admitting: Cardiovascular Disease

## 2014-05-26 ENCOUNTER — Ambulatory Visit (INDEPENDENT_AMBULATORY_CARE_PROVIDER_SITE_OTHER): Payer: Medicare Other | Admitting: Cardiovascular Disease

## 2014-05-26 VITALS — BP 140/82 | HR 61 | Ht 67.5 in | Wt 180.0 lb

## 2014-05-26 DIAGNOSIS — I10 Essential (primary) hypertension: Secondary | ICD-10-CM | POA: Diagnosis not present

## 2014-05-26 DIAGNOSIS — I2510 Atherosclerotic heart disease of native coronary artery without angina pectoris: Secondary | ICD-10-CM

## 2014-05-26 DIAGNOSIS — K3189 Other diseases of stomach and duodenum: Secondary | ICD-10-CM | POA: Diagnosis not present

## 2014-05-26 DIAGNOSIS — K30 Functional dyspepsia: Secondary | ICD-10-CM

## 2014-05-26 DIAGNOSIS — E7800 Pure hypercholesterolemia, unspecified: Secondary | ICD-10-CM | POA: Diagnosis not present

## 2014-05-26 DIAGNOSIS — R1013 Epigastric pain: Secondary | ICD-10-CM

## 2014-05-26 MED ORDER — LISINOPRIL 20 MG PO TABS: 20.0000 mg | ORAL_TABLET | Freq: Every day | ORAL | Status: DC

## 2014-05-26 NOTE — Assessment & Plan Note (Signed)
We did discuss his history of cholesterol. We'll continue on the current dose of Crestor. Encouraged him to watch his diet, try to continue gentle weight loss to achieve goal LDL less than 70.

## 2014-05-26 NOTE — Patient Instructions (Signed)
You are doing well. No medication changes were made.  Please call us if you have new issues that need to be addressed before your next appt.  Your physician wants you to follow-up in: 6 months.  You will receive a reminder letter in the mail two months in advance. If you don't receive a letter, please call our office to schedule the follow-up appointment.   

## 2014-05-26 NOTE — Progress Notes (Signed)
Patient ID: Barry Small, male    DOB: 07/12/1934, 80 y.o.   MRN: [id number]  HPI Comments: Barry Small is a pleasant 80 year old gentleman with a history of coronary artery disease, 4 vessel bypass in 1989 at [facility], history of stenting in 2007 after presenting to [facility] with epigastric and chest discomfort, presented for routine followup.  No significant change from his prior clinic visit. Weight is down 4 pounds from early in 2015 In followup today, he is active, no complaints of shortness of breath or chest pain.  Reports his blood pressure is well controlled at home with systolic pressure typically in the 120-130 range.  He reports that he is doing well with no changes. 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 He is tolerating cholesterol medication with no symptoms Total cholesterol 154, LDL 94. This has slowly been increasing over the past several years, likely from increasing weight  His last stress nuclear study was in June of 2009. EF was normal with an old inferior wall infarct scar. There was no ischemia.   EKG shows normal sinus rhythm with rate 61 beats per minute, no significant ST-T wave changes. Consider old inferior MI      Outpatient Encounter Prescriptions as of 05/26/2014  Medication Sig  . aspirin 81 MG EC tablet Take 81 mg by mouth daily.   . cholecalciferol (VITAMIN D) 1000 UNITS tablet Take 2,000 units per day.  . Coenzyme Q10 (CO Q-10) 100 MG CAPS Take 300 mg by mouth daily.  . Cyanocobalamin (VITAMIN B-12) 5000 MCG SUBL Place under the tongue daily.  . fluticasone (FLONASE) 50 MCG/ACT nasal spray Place 1 spray into the nose daily.   Marland Kitchen lisinopril (PRINIVIL,ZESTRIL) 20 MG tablet Take 1 tablet (20 mg total) by mouth daily.  . metoprolol tartrate (LOPRESSOR) 25 MG tablet Take one half  tablet by mouth twice daily  . Multiple Vitamin (MULTIVITAMIN) tablet Take 1 tablet by mouth  daily.    Marland Kitchen NITROSTAT 0.4 MG SL tablet DISSOLVE ONE TABLET UNDER TONGUE EVERY 5 MINUTES AS NEEDED FOR CHESTPAIN  . omeprazole (PRILOSEC) 20 MG capsule Take 1 capsule (20 mg total) by mouth 2 (two) times daily.  . Probiotic Product (PROBIOTIC DAILY PO) Take by mouth daily.  . rosuvastatin (CRESTOR) 20 MG tablet Take 1 tablet (20 mg total) by mouth daily.    Review of Systems  Constitutional: Negative.   HENT: Negative.   Eyes: Negative.   Respiratory: Negative.   Cardiovascular: Negative.   Gastrointestinal: Negative.   Endocrine: Negative.   Musculoskeletal: Negative.   Skin: Negative.   Allergic/Immunologic: Negative.   Neurological: Negative.   Hematological: Negative.   Psychiatric/Behavioral: Negative.   All other systems reviewed and are negative.  BP 140/82  Pulse 61  Ht 5' 7.5" (1.715 m)  Wt 180 lb (81.647 kg)  BMI 27.76 kg/m2  Physical Exam  Nursing note and vitals reviewed. Constitutional: He is oriented to person, place, and time. He appears well-developed and well-nourished.  HENT:  Head: Normocephalic.  Nose: Nose normal.  Mouth/Throat: Oropharynx is clear and moist.  Eyes: Conjunctivae are normal. Pupils are equal, round, and reactive to light.  Neck: Normal range of motion. Neck supple. No JVD present.  Cardiovascular: Normal rate, regular rhythm, S1 normal, S2 normal and intact distal pulses.  Exam reveals no gallop and no friction rub.   Murmur heard.  Crescendo systolic murmur is  present with a grade of 2/6  Pulmonary/Chest: Effort normal and breath sounds normal. No respiratory distress. He has no wheezes. He has no rales. He exhibits no tenderness.  Abdominal: Soft. Bowel sounds are normal. He exhibits no distension. There is no tenderness.  Musculoskeletal: Normal range of motion. He exhibits no edema and no tenderness.  Lymphadenopathy:    He has no cervical adenopathy.  Neurological: He is alert and oriented to person, place, and time. Coordination  normal.  Skin: Skin is warm and dry. No rash noted. No erythema.  Psychiatric: He has a normal mood and affect. His behavior is normal. Judgment and thought content normal.      Assessment and Plan

## 2014-05-26 NOTE — Assessment & Plan Note (Signed)
Blood pressure is well controlled on today's visit. No changes made to the medications. 

## 2014-05-26 NOTE — Assessment & Plan Note (Signed)
Currently with no symptoms of angina. No further workup at this time. Continue current medication regimen. 

## 2014-05-28 DIAGNOSIS — Z23 Encounter for immunization: Secondary | ICD-10-CM | POA: Diagnosis not present

## 2014-06-03 ENCOUNTER — Encounter: Payer: Self-pay | Admitting: Gastroenterology

## 2014-09-18 ENCOUNTER — Telehealth: Payer: Self-pay | Admitting: Family Medicine

## 2014-09-18 NOTE — Telephone Encounter (Signed)
Noted, thanks!

## 2014-09-18 NOTE — Telephone Encounter (Signed)
Has an appt on 09/19/14 with Dr. Damita Dunnings.

## 2014-09-18 NOTE — Telephone Encounter (Signed)
Patient Name: Barry Small  DOB: Jan 19, 1934    Nurse Assessment  Nurse: Marcelline Deist, RN, Lynda Date/Time (Eastern Time): 09/18/2014 10:16:54 AM  Confirm and document reason for call. If symptomatic, describe symptoms. ---Caller states husband has on/off lower left sided abdominal pain. Had colitis several years ago, this pain is similar. Had a BM, had some constipation. It seems to be better after applying heat. No fever. No nausea/vomiting.  Has the patient traveled out of the country within the last 30 days? ---Not Applicable  Does the patient require triage? ---Yes  Related visit to physician within the last 2 weeks? ---No  Does the PT have any chronic conditions? (i.e. diabetes, asthma, etc.) ---Yes  List chronic conditions. ---colitis, heart issues, ?IBS     Guidelines    Guideline Title Affirmed Question Affirmed Notes  Abdominal Pain - Male Age > 60 years    Final Disposition User   See Physician within [place], Therapist, sports, [place]

## 2014-09-19 ENCOUNTER — Encounter: Payer: Self-pay | Admitting: Family Medicine

## 2014-09-19 ENCOUNTER — Ambulatory Visit (INDEPENDENT_AMBULATORY_CARE_PROVIDER_SITE_OTHER): Payer: Medicare Other | Admitting: Family Medicine

## 2014-09-19 VITALS — BP 146/70 | HR 67 | Temp 98.0°F | Wt 183.5 lb

## 2014-09-19 DIAGNOSIS — R1030 Lower abdominal pain, unspecified: Secondary | ICD-10-CM | POA: Diagnosis not present

## 2014-09-19 MED ORDER — CIPROFLOXACIN HCL 500 MG PO TABS: 500.0000 mg | ORAL_TABLET | Freq: Two times a day (BID) | ORAL | Status: DC

## 2014-09-19 MED ORDER — METRONIDAZOLE 500 MG PO TABS: 500.0000 mg | ORAL_TABLET | Freq: Three times a day (TID) | ORAL | Status: DC

## 2014-09-19 NOTE — Progress Notes (Signed)
Pre visit review using our clinic review tool, if applicable. No additional management support is needed unless otherwise documented below in the visit note.  Abd pain. Started around [facility] 2015. Was eating larger meals at the time.   He had some lower abd pain, more gas than normal.  More discomfort after a high fiber meal 09/08/14.  No upper abd pain.  No fevers.  Some better in the meantime.  Has been eating more yogurt in the meantime.   H/o mild diverticulosis was found in the sigmoid colon prev.   Minimal sx now.   No blood in stool. No vomiting.  Prev with some diarrhea, none now.  No fevers.  No urinary sx.   Meds, vitals, and allergies reviewed.   ROS: See HPI.  Otherwise, noncontributory.  GEN: nad, alert and oriented HEENT: mucous membranes moist NECK: supple w/o LA CV: rrr.  PULM: ctab, no inc wob ABD: soft, +bs (he points to the LLQ where he had pain prev, not now) EXT: no edema SKIN: no acute rash

## 2014-09-19 NOTE — Patient Instructions (Signed)
It looks like you had a really mild flare of diverticulitis.  Clear liquid diet for another day or two, low fiber diet for now.  If you have more pain, then start the antibiotics and notify me.  Take care.

## 2014-09-20 DIAGNOSIS — R1030 Lower abdominal pain, unspecified: Secondary | ICD-10-CM | POA: Insufficient documentation

## 2014-09-20 NOTE — Assessment & Plan Note (Signed)
Was likely mild and improved diverticulitis.  Hold abx for now, clear liquid diet, start abx if worsening and notify me.  He agrees. Routine cautions given, d/w pt. He agrees.  Clearly nontoxic today.

## 2014-10-22 ENCOUNTER — Other Ambulatory Visit: Payer: Self-pay | Admitting: Family Medicine

## 2014-10-22 DIAGNOSIS — E7800 Pure hypercholesterolemia, unspecified: Secondary | ICD-10-CM

## 2014-10-24 ENCOUNTER — Other Ambulatory Visit: Payer: Self-pay | Admitting: Cardiovascular Disease

## 2014-10-24 ENCOUNTER — Telehealth: Payer: Self-pay

## 2014-10-24 MED ORDER — OMEPRAZOLE 20 MG PO CPDR: 20.0000 mg | DELAYED_RELEASE_CAPSULE | Freq: Two times a day (BID) | ORAL | Status: DC

## 2014-10-24 MED ORDER — LISINOPRIL 20 MG PO TABS: 20.0000 mg | ORAL_TABLET | Freq: Every day | ORAL | Status: DC

## 2014-10-24 MED ORDER — METOPROLOL TARTRATE 25 MG PO TABS: ORAL_TABLET | ORAL | Status: DC

## 2014-10-24 NOTE — Telephone Encounter (Signed)
Left message for pt to call back  °

## 2014-10-24 NOTE — Telephone Encounter (Signed)
Pt wife called, states Lisinopril needs to be 40 mg not 20 mg refill 90 day supply

## 2014-10-24 NOTE — Telephone Encounter (Signed)
Needs 90 day supply Lisinopril needs 40 mg

## 2014-10-27 ENCOUNTER — Other Ambulatory Visit (INDEPENDENT_AMBULATORY_CARE_PROVIDER_SITE_OTHER): Payer: Medicare Other

## 2014-10-27 DIAGNOSIS — E7800 Pure hypercholesterolemia, unspecified: Secondary | ICD-10-CM

## 2014-10-27 LAB — LIPID PANEL
Cholesterol: 132 mg/dL (ref 0–200)
HDL: 39.9 mg/dL (ref >39.00)
LDL Cholesterol: 77 mg/dL (ref 0–99)
NonHDL: 92.1
Total CHOL/HDL Ratio: 3
Triglycerides: 74 mg/dL (ref 0.0–149.0)
VLDL: 14.8 mg/dL (ref 0.0–40.0)

## 2014-10-27 LAB — COMPREHENSIVE METABOLIC PANEL
ALT: 16 U/L (ref 0–53)
AST: 19 U/L (ref 0–37)
Albumin: 4.1 g/dL (ref 3.5–5.2)
Alkaline Phosphatase: 49 U/L (ref 39–117)
BUN: 15 mg/dL (ref 6–23)
CO2: 25 mEq/L (ref 19–32)
Calcium: 9.3 mg/dL (ref 8.4–10.5)
Chloride: 103 mEq/L (ref 96–112)
Creatinine, Ser: 0.78 mg/dL (ref 0.40–1.50)
GFR: 102.21 mL/min (ref >60.00)
Glucose, Bld: 108 mg/dL — ABNORMAL HIGH (ref 70–99)
Potassium: 4.5 mEq/L (ref 3.5–5.1)
Sodium: 134 mEq/L — ABNORMAL LOW (ref 135–145)
Total Bilirubin: 0.8 mg/dL (ref 0.2–1.2)
Total Protein: 6.7 g/dL (ref 6.0–8.3)

## 2014-10-31 ENCOUNTER — Ambulatory Visit (INDEPENDENT_AMBULATORY_CARE_PROVIDER_SITE_OTHER): Payer: Medicare Other | Admitting: Family Medicine

## 2014-10-31 ENCOUNTER — Encounter: Payer: Medicare Other | Admitting: Family Medicine

## 2014-10-31 ENCOUNTER — Encounter: Payer: Self-pay | Admitting: Family Medicine

## 2014-10-31 VITALS — BP 142/76 | HR 58 | Temp 97.9°F | Ht 67.75 in | Wt 184.8 lb

## 2014-10-31 DIAGNOSIS — I2510 Atherosclerotic heart disease of native coronary artery without angina pectoris: Secondary | ICD-10-CM | POA: Diagnosis not present

## 2014-10-31 DIAGNOSIS — Z0000 Encounter for general adult medical examination without abnormal findings: Secondary | ICD-10-CM

## 2014-10-31 DIAGNOSIS — Z7189 Other specified counseling: Secondary | ICD-10-CM

## 2014-10-31 MED ORDER — METOPROLOL TARTRATE 25 MG PO TABS: 12.5000 mg | ORAL_TABLET | Freq: Every day | ORAL | Status: DC

## 2014-10-31 MED ORDER — ROSUVASTATIN CALCIUM 20 MG PO TABS: 10.0000 mg | ORAL_TABLET | Freq: Every day | ORAL | Status: DC

## 2014-10-31 NOTE — Patient Instructions (Signed)
Check on getting a tetanus shot at the pharmacy.   It may be cheaper there.  If you have a lot of aches on the crestor, then skip a few days of the medicine.  See if that helps and ask Dr.Gollan for his input.   Take care.  Glad to see you.   I'll check with GI on your follow up colonoscopy.

## 2014-10-31 NOTE — Progress Notes (Signed)
Pre visit review using our clinic review tool, if applicable. No additional management support is needed unless otherwise documented below in the visit note.  I have personally reviewed the Medicare Annual Wellness questionnaire and have noted 1. The patient's medical and social history 2. Their use of alcohol, tobacco or illicit drugs 3. Their current medications and supplements 4. The patient's functional ability including ADL's, fall risks, home safety risks and hearing or visual             impairment. 5. Diet and physical activities 6. Evidence for depression or mood disorders  The patients weight, height, BMI have been recorded in the chart and visual acuity is per eye clinic.  I have made referrals, counseling and provided education to the patient based review of the above and I have provided the pt with a written personalized care plan for preventive services.  Provider list updated- see scanned forms.  Routine anticipatory guidance given to patient.  See health maintenance.  Flu 2015 Shingles 2008 PNA 2008 Tetanus d/w pt.  Prev done 2005 Colonoscopy done 2010 Prostate cancer screening declined, d/w pt.   Advance directive- wife designated if patient were incapacitated.  Cognitive function addressed- see scanned forms- and if abnormal then additional documentation follows.   His prev abd pain resolved w/o need for abx.  No sx now.    CAD. Has f/u with cards pending.  occ aches on statin, taking 1/2 dose.  D/w pt.  He has occ skipped a few days to help with the aches.  No ADE on meds o/w. No CP.  Feeling well.  Labs d/w pt.  Lipids at goal.   PMH and SH reviewed  Meds, vitals, and allergies reviewed.   ROS: See HPI.  Otherwise negative.    GEN: nad, alert and oriented HEENT: mucous membranes moist NECK: supple w/o LA CV: rrr. PULM: ctab, no inc wob ABD: soft, +bs EXT: no edema SKIN: no acute rash

## 2014-11-01 ENCOUNTER — Telehealth: Payer: Self-pay | Admitting: Family Medicine

## 2014-11-01 DIAGNOSIS — Z7189 Other specified counseling: Secondary | ICD-10-CM | POA: Insufficient documentation

## 2014-11-01 NOTE — Telephone Encounter (Signed)
Left message on voice mail  to call back

## 2014-11-01 NOTE — Assessment & Plan Note (Signed)
Has f/u with cards pending.  Occ aches on statin, taking 1/2 dose.  D/w pt.  He has occ skipped a few days to help with the aches.  No ADE on meds o/w. No CP.  Feeling well.  He'll continue as is for now, he will ask for cards input on QOD dosing of statin.  He agrees.  Encouraged diet and exercise.

## 2014-11-01 NOTE — Assessment & Plan Note (Signed)
Flu 2015 Shingles 2008 PNA 2008 Tetanus d/w pt.  Prev done 2005 Colonoscopy done 2010 Prostate cancer screening declined, d/w pt.   Advance directive- wife designated if patient were incapacitated.   Cognitive function addressed- see scanned forms- and if abnormal then additional documentation follows.

## 2014-11-01 NOTE — Telephone Encounter (Addendum)
Please call pt.  Due for f/u with GI re: colon cancer screening.  I checked with Dr. Fuller Plan in the meantime.  Have him call GI about an appointment.  Thanks.

## 2014-11-02 NOTE — Telephone Encounter (Signed)
Left detailed message on voicemail.  

## 2014-11-22 ENCOUNTER — Ambulatory Visit (INDEPENDENT_AMBULATORY_CARE_PROVIDER_SITE_OTHER): Payer: Medicare Other | Admitting: Cardiovascular Disease

## 2014-11-22 ENCOUNTER — Encounter: Payer: Self-pay | Admitting: Cardiovascular Disease

## 2014-11-22 VITALS — BP 132/80 | HR 68 | Ht 67.5 in | Wt 179.8 lb

## 2014-11-22 DIAGNOSIS — Z23 Encounter for immunization: Secondary | ICD-10-CM | POA: Diagnosis not present

## 2014-11-22 DIAGNOSIS — E7800 Pure hypercholesterolemia, unspecified: Secondary | ICD-10-CM

## 2014-11-22 DIAGNOSIS — I10 Essential (primary) hypertension: Secondary | ICD-10-CM

## 2014-11-22 DIAGNOSIS — I2510 Atherosclerotic heart disease of native coronary artery without angina pectoris: Secondary | ICD-10-CM | POA: Diagnosis not present

## 2014-11-22 NOTE — Assessment & Plan Note (Signed)
Recommended he continue on Crestor 10 mg daily He is on coenzyme Q 10, occasionally takes NSAIDs form arm pain

## 2014-11-22 NOTE — Assessment & Plan Note (Signed)
Currently with no symptoms of angina. No further workup at this time. Continue current medication regimen. 

## 2014-11-22 NOTE — Patient Instructions (Signed)
You are doing well. No medication changes were made.  Please call us if you have new issues that need to be addressed before your next appt.  Your physician wants you to follow-up in: 6 months.  You will receive a reminder letter in the mail two months in advance. If you don't receive a letter, please call our office to schedule the follow-up appointment.   

## 2014-11-22 NOTE — Assessment & Plan Note (Signed)
Blood pressure is well controlled on today's visit. No changes made to the medications. 

## 2014-11-22 NOTE — Progress Notes (Signed)
Patient ID: Barry Small, male    DOB: [birth year], 81 y.o.   MRN: [id number]  HPI Comments: [name] is a pleasant 81 year old gentleman with a history of coronary artery disease, 4 vessel bypass in 1989 at [facility], history of stenting in 2007 after presenting to [facility] with epigastric and chest discomfort, presented for routine followup of his coronary artery disease  No significant change from his prior clinic visit.  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  Very active at baseline He is having some muscle ache in the evenings which she attributes to his Crestor  EKG on today's visit shows normal sinus rhythm with no significant ST or T-wave changes Recent total cholesterol 130 range, LDL 77 Other past medical historyResults discussed with him   Previous pneumonia in February 2014  His last stress nuclear study was in June of 2009. EF was normal with an old inferior wall infarct scar. There was no ischemia.   Allergies  Allergen Reactions  . Atorvastatin     REACTION: myalgia  . Morphine     REACTION: syncope  . Simvastatin     REACTION: ? reaction    Outpatient Encounter Prescriptions as of 11/22/2014  Medication Sig  . aspirin 81 MG EC tablet Take 81 mg by mouth daily.   . cholecalciferol (VITAMIN D) 1000 UNITS tablet Take 2,000 units per day.  . Coenzyme Q10 (CO Q-10) 100 MG CAPS Take 300 mg by mouth daily.  . Cyanocobalamin (VITAMIN B-12) 5000 MCG SUBL Place under the tongue daily.  Marland Kitchen lisinopril (PRINIVIL,ZESTRIL) 20 MG tablet Take 1 tablet (20 mg total) by mouth daily.  . metoprolol tartrate (LOPRESSOR) 25 MG tablet Take 0.5 tablets (12.5 mg total) by mouth daily.  . Multiple Vitamin (MULTIVITAMIN) tablet Take 1 tablet by mouth daily.    Marland Kitchen NITROSTAT 0.4 MG SL tablet DISSOLVE ONE TABLET UNDER TONGUE EVERY 5 MINUTES AS NEEDED FOR CHESTPAIN  . omeprazole (PRILOSEC) 20  MG capsule Take 1 capsule (20 mg total) by mouth 2 (two) times daily. (Patient taking differently: Take 20 mg by mouth daily as needed. )  . rosuvastatin (CRESTOR) 20 MG tablet Take 0.5 tablets (10 mg total) by mouth daily.    Past Medical History  Diagnosis Date  . Coronary artery disease   . GERD (gastroesophageal reflux disease)   . Barrett's esophagus   . Colon polyps 04/2003    type unknown   . Hypertension   . Hyperlipidemia   . Elevated glucose   . Elevated bilirubin   . Pneumonia 2014  . Myocardial infarction     Past Surgical History  Procedure Laterality Date  . Coronary artery bypass graft  1989    [facility]  . Coronary angioplasty  2001    x 2, Dr.Pulsipher  . Cholecystectomy  09/2001  . Nasal sinus surgery  06/23/2008    Dr.Clark  . Esophagogastroduodenoscopy  05/09/2003    Barrett's  . Cardiolite  12/2001    h/o vessel infarct EF 66%  . Stress myoview  09/30/2004    Normal EF 70%  . Coronary angioplasty with stent placement  12/05/2005  . Stress myoview  02/11/2006    No change, c/w 09/2004  . Stress myoview  02/22/2008    Abnormal w/prior inf infarct  No signif ischemia  . Esophagogastroduodenoscopy  11/17/08    Barrett's Esoph Erosive Esoph HH (Dr. Fuller Plan)  2 years  . Colonoscopy w/ polypectomy  05/09/2003    x multiple, Dr. Vira Agar  . Mch  03/28-03/31/2007    Chest pain  . Nose surgery  06/23/2008    Dr. Carlis Abbott  . Colonoscopy  11/15/2008    Mild divertics, 5 yrs, Dr. Fuller Plan    Social History  reports that he has never smoked. He has never used smokeless tobacco. He reports that he drinks alcohol. He reports that he does not use illicit drugs.  Family History family history includes Heart disease in his father; Pneumonia in his mother. There is no history of Colon cancer or Prostate cancer.   Review of Systems  Constitutional: Negative.   Respiratory: Negative.   Cardiovascular: Negative.   Gastrointestinal: Negative.   Musculoskeletal: Negative.    Skin: Negative.   Neurological: Negative.   Hematological: Negative.   Psychiatric/Behavioral: Negative.   All other systems reviewed and are negative.  BP 132/80 mmHg  Pulse 68  Ht 5' 7.5" (1.715 m)  Wt 179 lb 12 oz (81.534 kg)  BMI 27.72 kg/m2  Physical Exam  Constitutional: He is oriented to person, place, and time. He appears well-developed and well-nourished.  HENT:  Head: Normocephalic.  Nose: Nose normal.  Mouth/Throat: Oropharynx is clear and moist.  Eyes: Conjunctivae are normal. Pupils are equal, round, and reactive to light.  Neck: Normal range of motion. Neck supple. No JVD present.  Cardiovascular: Normal rate, regular rhythm, S1 normal, S2 normal and intact distal pulses.  Exam reveals no gallop and no friction rub.   Murmur heard.  Crescendo systolic murmur is present with a grade of 2/6  Pulmonary/Chest: Effort normal and breath sounds normal. No respiratory distress. He has no wheezes. He has no rales. He exhibits no tenderness.  Abdominal: Soft. Bowel sounds are normal. He exhibits no distension. There is no tenderness.  Musculoskeletal: Normal range of motion. He exhibits no edema or tenderness.  Lymphadenopathy:    He has no cervical adenopathy.  Neurological: He is alert and oriented to person, place, and time. Coordination normal.  Skin: Skin is warm and dry. No rash noted. No erythema.  Psychiatric: He has a normal mood and affect. His behavior is normal. Judgment and thought content normal.      Assessment and Plan   Nursing note and vitals reviewed.

## 2014-12-17 ENCOUNTER — Other Ambulatory Visit: Payer: Self-pay | Admitting: Cardiovascular Disease

## 2015-01-19 ENCOUNTER — Other Ambulatory Visit: Payer: Self-pay | Admitting: Cardiovascular Disease

## 2015-02-27 DIAGNOSIS — H2513 Age-related nuclear cataract, bilateral: Secondary | ICD-10-CM | POA: Diagnosis not present

## 2015-02-28 ENCOUNTER — Telehealth: Payer: Self-pay | Admitting: Cardiovascular Disease

## 2015-02-28 DIAGNOSIS — R011 Cardiac murmur, unspecified: Secondary | ICD-10-CM

## 2015-02-28 NOTE — Telephone Encounter (Signed)
Okay to schedule above testing

## 2015-02-28 NOTE — Telephone Encounter (Signed)
Is it ok for me to put an order in for these?

## 2015-02-28 NOTE — Telephone Encounter (Signed)
Patient scheduled with Sabrina's help!

## 2015-02-28 NOTE — Telephone Encounter (Signed)
Patient would like to come in on July 25th or sometime that week.

## 2015-02-28 NOTE — Telephone Encounter (Signed)
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

## 2015-03-06 ENCOUNTER — Encounter: Payer: Self-pay | Admitting: Gastroenterology

## 2015-04-02 ENCOUNTER — Telehealth: Payer: Self-pay | Admitting: Cardiovascular Disease

## 2015-04-02 NOTE — Telephone Encounter (Signed)
Dr Alben Spittle is pt CDL doctor.  But wants to know if Dr Rockey Situ can help with this see below. Please advise.

## 2015-04-02 NOTE — Telephone Encounter (Signed)
Pt is sched for ETT on Thursday (when you're rounds only). He wants to know if you can write him a letter before Friday.

## 2015-04-02 NOTE — Telephone Encounter (Signed)
Patient needs letter sent to [facility] and wellness .  Dr. Rockey Situ said he would send letter .  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  Please fax if able to [phone].    Please call patient with issues.   Explained to patient that we may have to have test done prior to sending letter.  But he insist that we try as he doesn't know what else to do and doesn't want CDL to expire.    ALSO patient wants to know if he can do ETT and echo earlier to get faster results.     Please call patient and let me know .

## 2015-04-03 NOTE — Telephone Encounter (Signed)
Would recommend he come in for treadmill tomorrow, Wednesday We can squeeze him in That can make an official Can we checked the time of the echo on Thursday This could be read earlier in the day,  Letter could then be sent on Thursday

## 2015-04-05 ENCOUNTER — Other Ambulatory Visit: Payer: Self-pay

## 2015-04-05 ENCOUNTER — Ambulatory Visit (INDEPENDENT_AMBULATORY_CARE_PROVIDER_SITE_OTHER): Payer: Medicare Other | Admitting: Cardiovascular Disease

## 2015-04-05 ENCOUNTER — Ambulatory Visit (INDEPENDENT_AMBULATORY_CARE_PROVIDER_SITE_OTHER): Payer: Medicare Other

## 2015-04-05 DIAGNOSIS — R011 Cardiac murmur, unspecified: Secondary | ICD-10-CM | POA: Diagnosis not present

## 2015-04-05 LAB — EXERCISE TOLERANCE TEST
Estimated workload: 7 METS
Exercise duration (min): 6 min
Exercise duration (sec): 0 s
Peak HR: 141 {beats}/min
Percent HR: 99 %
Rest HR: 75 {beats}/min

## 2015-04-06 ENCOUNTER — Telehealth: Payer: Self-pay | Admitting: Cardiovascular Disease

## 2015-04-06 NOTE — Telephone Encounter (Signed)
Please fax clearance for CDL to [phone].  Attention Dr. Rosanna Randy.   Please call patient when it is sent.

## 2015-04-06 NOTE — Telephone Encounter (Signed)
Letter faxed.

## 2015-05-18 DIAGNOSIS — M1811 Unilateral primary osteoarthritis of first carpometacarpal joint, right hand: Secondary | ICD-10-CM | POA: Diagnosis not present

## 2015-05-30 ENCOUNTER — Other Ambulatory Visit: Payer: Self-pay

## 2015-05-30 ENCOUNTER — Ambulatory Visit: Payer: Medicare Other | Admitting: Cardiovascular Disease

## 2015-05-30 MED ORDER — ROSUVASTATIN CALCIUM 20 MG PO TABS: 20.0000 mg | ORAL_TABLET | Freq: Every day | ORAL | Status: DC

## 2015-05-30 MED ORDER — LISINOPRIL 40 MG PO TABS: 40.0000 mg | ORAL_TABLET | Freq: Every day | ORAL | Status: DC

## 2015-05-30 MED ORDER — METOPROLOL TARTRATE 25 MG PO TABS: 25.0000 mg | ORAL_TABLET | Freq: Two times a day (BID) | ORAL | Status: DC

## 2015-06-05 DIAGNOSIS — Z23 Encounter for immunization: Secondary | ICD-10-CM | POA: Diagnosis not present

## 2015-06-20 ENCOUNTER — Ambulatory Visit (INDEPENDENT_AMBULATORY_CARE_PROVIDER_SITE_OTHER): Payer: Medicare Other | Admitting: Cardiovascular Disease

## 2015-06-20 ENCOUNTER — Encounter: Payer: Self-pay | Admitting: Cardiovascular Disease

## 2015-06-20 VITALS — BP 120/64 | HR 64 | Ht 67.0 in | Wt 181.8 lb

## 2015-06-20 DIAGNOSIS — I10 Essential (primary) hypertension: Secondary | ICD-10-CM

## 2015-06-20 DIAGNOSIS — E7800 Pure hypercholesterolemia, unspecified: Secondary | ICD-10-CM | POA: Diagnosis not present

## 2015-06-20 DIAGNOSIS — I2510 Atherosclerotic heart disease of native coronary artery without angina pectoris: Secondary | ICD-10-CM

## 2015-06-20 NOTE — Assessment & Plan Note (Signed)
Currently with no symptoms of angina. No further workup at this time. Continue current medication regimen. 

## 2015-06-20 NOTE — Patient Instructions (Signed)
You are doing well. No medication changes were made.  Please call us if you have new issues that need to be addressed before your next appt.  Your physician wants you to follow-up in: 6 months.  You will receive a reminder letter in the mail two months in advance. If you don't receive a letter, please call our office to schedule the follow-up appointment.   

## 2015-06-20 NOTE — Assessment & Plan Note (Signed)
Blood pressure is well controlled on today's visit. No changes made to the medications. 

## 2015-06-20 NOTE — Assessment & Plan Note (Signed)
Cholesterol is at goal on the current lipid regimen. No changes to the medications were made.  

## 2015-06-20 NOTE — Progress Notes (Signed)
Patient ID: Barry Small, male    DOB: 1934/02/21, 81 y.o.   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  No regular exercise program Does remain active  EKG on today's visit shows normal sinus rhythm with no significant ST or T-wave changes, rate 64 bpm, PVC noted  Other past medical history  total cholesterol 130 range, LDL 77 (results discussed with him today)  Previous pneumonia in February 2014  His last stress nuclear study was in June of 2009. EF was normal with an old inferior wall infarct scar. There was no ischemia.   Allergies  Allergen Reactions  . Atorvastatin     REACTION: myalgia  . Morphine     REACTION: syncope  . Simvastatin     REACTION: ? reaction    Outpatient Encounter Prescriptions as of 06/20/2015  Medication Sig  . aspirin 81 MG EC tablet Take 81 mg by mouth daily.   . cholecalciferol (VITAMIN D) 1000 UNITS tablet Take 2,000 units per day.  . Coenzyme Q10 (CO Q-10) 100 MG CAPS Take 300 mg by mouth daily.  . Cyanocobalamin (VITAMIN B-12) 5000 MCG SUBL Place under the tongue daily.  Marland Kitchen lisinopril (PRINIVIL,ZESTRIL) 20 MG tablet Take 1 tablet (20 mg total) by mouth daily.  . metoprolol tartrate (LOPRESSOR) 25 MG tablet Take 1 tablet (25 mg total) by mouth 2 (two) times daily. (Patient taking differently: Take 25 mg by mouth daily. )  . Multiple Vitamin (MULTIVITAMIN) tablet Take 1 tablet by mouth daily.    Marland Kitchen NITROSTAT 0.4 MG SL tablet DISSOLVE ONE TABLET UNDER TONGUE EVERY 5 MINUTES AS NEEDED FOR CHESTPAIN  .  omeprazole (PRILOSEC) 20 MG capsule TAKE ONE CAPSULE BY MOUTH TWICE DAILY  (Patient taking differently: TAKE ONE CAPSULE BY MOUTH EVERY OTHER DAY.)  . rosuvastatin (CRESTOR) 20 MG tablet Take 1 tablet (20 mg total) by mouth daily.  . [DISCONTINUED] lisinopril (PRINIVIL,ZESTRIL) 40 MG tablet Take 1 tablet (40 mg total) by mouth daily. (Patient not taking: Reported on 06/20/2015)  . [DISCONTINUED] omeprazole (PRILOSEC) 20 MG capsule Take 1 capsule (20 mg total) by mouth 2 (two) times daily. (Patient not taking: Reported on 06/20/2015)   No facility-administered encounter medications on file as of 06/20/2015.    Past Medical History  Diagnosis Date  . Coronary artery disease   . GERD (gastroesophageal reflux disease)   . Barrett's esophagus   . Colon polyps 04/2003    type unknown   . Hypertension   . Hyperlipidemia   . Elevated glucose   . Elevated bilirubin   . Pneumonia 2014  . Myocardial infarction [facility])     Past Surgical History  Procedure Laterality Date  . Coronary artery bypass graft  1989    [facility]  . Coronary angioplasty  2001    x 2, Dr.Pulsipher  . Cholecystectomy  09/2001  . Nasal sinus surgery  06/23/2008    Dr.Clark  . Esophagogastroduodenoscopy  05/09/2003    Barrett's  . Cardiolite  12/2001    h/o vessel infarct EF 66%  . Stress myoview  09/30/2004    Normal EF 70%  . Coronary angioplasty with stent placement  12/05/2005  . Stress myoview  02/11/2006    No change, c/w 09/2004  . Stress myoview  02/22/2008    Abnormal w/prior inf infarct  No signif ischemia  . Esophagogastroduodenoscopy  11/17/08    Barrett's Esoph Erosive Esoph HH (Dr. Fuller Plan)  2 years  . Colonoscopy w/ polypectomy  05/09/2003    x multiple, Dr. Vira Agar  . Mch  03/28-03/31/2007    Chest pain  . Nose surgery  06/23/2008    Dr. Carlis Abbott  . Colonoscopy  11/15/2008    Mild divertics, 5 yrs, Dr. Fuller Plan    Social History  reports that he has never smoked. He has never used smokeless tobacco. He  reports that he drinks alcohol. He reports that he does not use illicit drugs.  Family History family history includes Heart disease in his father; Pneumonia in his mother. There is no history of Colon cancer or Prostate cancer.   Review of Systems  Constitutional: Negative.   Respiratory: Negative.   Cardiovascular: Negative.   Gastrointestinal: Negative.   Musculoskeletal: Negative.   Skin: Negative.   Neurological: Negative.   Hematological: Negative.   Psychiatric/Behavioral: Negative.   All other systems reviewed and are negative.  BP 120/64 mmHg  Pulse 64  Ht 5\' 7"  (1.702 m)  Wt 181 lb 12 oz (82.441 kg)  BMI 28.46 kg/m2  Physical Exam  Constitutional: He is oriented to person, place, and time. He appears well-developed and well-nourished.  HENT:  Head: Normocephalic.  Nose: Nose normal.  Mouth/Throat: Oropharynx is clear and moist.  Eyes: Conjunctivae are normal. Pupils are equal, round, and reactive to light.  Neck: Normal range of motion. Neck supple. No JVD present.  Cardiovascular: Normal rate, regular rhythm, S1 normal, S2 normal and intact distal pulses.  Exam reveals no gallop and no friction rub.   Murmur heard.  Crescendo systolic murmur is present with a grade of 2/6  Pulmonary/Chest: Effort normal and breath sounds normal. No respiratory distress. He has no wheezes. He has no rales. He exhibits no tenderness.  Abdominal: Soft. Bowel sounds are normal. He exhibits no distension. There is no tenderness.  Musculoskeletal: Normal range of motion. He exhibits no edema or tenderness.  Lymphadenopathy:    He has no cervical adenopathy.  Neurological: He is alert and oriented to person, place, and time. Coordination normal.  Skin: Skin is warm and dry. No rash noted. No erythema.  Psychiatric: He has a normal mood and affect. His behavior is normal. Judgment and thought content normal.      Assessment and Plan   Nursing note and vitals reviewed.

## 2015-07-10 DIAGNOSIS — M6530 Trigger finger, unspecified finger: Secondary | ICD-10-CM | POA: Diagnosis not present

## 2015-11-05 ENCOUNTER — Other Ambulatory Visit: Payer: Self-pay | Admitting: *Deleted

## 2015-11-05 ENCOUNTER — Encounter: Payer: Self-pay | Admitting: Family Medicine

## 2015-11-05 ENCOUNTER — Ambulatory Visit: Payer: Medicare Other

## 2015-11-05 ENCOUNTER — Ambulatory Visit (INDEPENDENT_AMBULATORY_CARE_PROVIDER_SITE_OTHER): Payer: Medicare Other | Admitting: Family Medicine

## 2015-11-05 ENCOUNTER — Telehealth: Payer: Self-pay

## 2015-11-05 VITALS — BP 142/80 | HR 56 | Temp 97.5°F

## 2015-11-05 DIAGNOSIS — I10 Essential (primary) hypertension: Secondary | ICD-10-CM | POA: Diagnosis not present

## 2015-11-05 DIAGNOSIS — Z0000 Encounter for general adult medical examination without abnormal findings: Secondary | ICD-10-CM | POA: Diagnosis not present

## 2015-11-05 DIAGNOSIS — K2270 Barrett's esophagus without dysplasia: Secondary | ICD-10-CM | POA: Diagnosis not present

## 2015-11-05 DIAGNOSIS — E785 Hyperlipidemia, unspecified: Secondary | ICD-10-CM | POA: Diagnosis not present

## 2015-11-05 DIAGNOSIS — E7800 Pure hypercholesterolemia, unspecified: Secondary | ICD-10-CM

## 2015-11-05 LAB — COMPREHENSIVE METABOLIC PANEL
ALT: 19 U/L (ref 0–53)
AST: 21 U/L (ref 0–37)
Albumin: 4.6 g/dL (ref 3.5–5.2)
Alkaline Phosphatase: 51 U/L (ref 39–117)
BUN: 13 mg/dL (ref 6–23)
CO2: 26 mEq/L (ref 19–32)
Calcium: 9.6 mg/dL (ref 8.4–10.5)
Chloride: 100 mEq/L (ref 96–112)
Creatinine, Ser: 0.79 mg/dL (ref 0.40–1.50)
GFR: 100.46 mL/min (ref >60.00)
Glucose, Bld: 103 mg/dL — ABNORMAL HIGH (ref 70–99)
Potassium: 4.6 mEq/L (ref 3.5–5.1)
Sodium: 134 mEq/L — ABNORMAL LOW (ref 135–145)
Total Bilirubin: 0.8 mg/dL (ref 0.2–1.2)
Total Protein: 7.3 g/dL (ref 6.0–8.3)

## 2015-11-05 LAB — LIPID PANEL
Cholesterol: 137 mg/dL (ref 0–200)
HDL: 42.8 mg/dL (ref >39.00)
LDL Cholesterol: 75 mg/dL (ref 0–99)
NonHDL: 93.72
Total CHOL/HDL Ratio: 3
Triglycerides: 92 mg/dL (ref 0.0–149.0)
VLDL: 18.4 mg/dL (ref 0.0–40.0)

## 2015-11-05 MED ORDER — METOPROLOL TARTRATE 25 MG PO TABS: 12.5000 mg | ORAL_TABLET | Freq: Two times a day (BID) | ORAL | Status: DC

## 2015-11-05 MED ORDER — NITROGLYCERIN 0.4 MG SL SUBL: SUBLINGUAL_TABLET | SUBLINGUAL | Status: DC

## 2015-11-05 MED ORDER — LISINOPRIL 20 MG PO TABS: 20.0000 mg | ORAL_TABLET | Freq: Every day | ORAL | Status: DC

## 2015-11-05 MED ORDER — METOPROLOL TARTRATE 25 MG PO TABS: 25.0000 mg | ORAL_TABLET | Freq: Every day | ORAL | Status: DC

## 2015-11-05 MED ORDER — ROSUVASTATIN CALCIUM 20 MG PO TABS: 20.0000 mg | ORAL_TABLET | Freq: Every day | ORAL | Status: DC

## 2015-11-05 MED ORDER — OMEPRAZOLE 20 MG PO CPDR: DELAYED_RELEASE_CAPSULE | ORAL | Status: DC

## 2015-11-05 NOTE — Assessment & Plan Note (Signed)
I would continue PPI for now.  He can try taking QOD to see if tolerated. Given h/o Barrett's I wouldn't stop PPI.  Recheck Cr today.  He agrees.

## 2015-11-05 NOTE — Telephone Encounter (Signed)
When we talked today at [facility], he specifically said he had been taking it once daily.  If he has been on it BID, then please correct the med list, and send 180 pills with 3rf.  Thanks.

## 2015-11-05 NOTE — Assessment & Plan Note (Signed)
Reasonable control, continue current meds.  No change, recheck labs today.  Continue work on diet and exercise, d/w pt.

## 2015-11-05 NOTE — Patient Instructions (Addendum)
Check with your insurance to see if they will cover the tetanus shot. Go to the lab on the way out.  We'll contact you with your lab report. Take care.  Glad to see you.

## 2015-11-05 NOTE — Assessment & Plan Note (Signed)
Reasonable to continue statin.  No change, recheck labs today.  Continue work on diet and exercise, d/w pt.  See notes on labs.

## 2015-11-05 NOTE — Telephone Encounter (Signed)
[facility] [employer] in [place] left v/m; received rx for metoprolol tartrate 25 mg taking one tab daily; pt has been taking med one tab bid for 2 months. Wanted to clarify instructions.Please advise.

## 2015-11-05 NOTE — Assessment & Plan Note (Signed)
Flu 2016 at pharmacy Shingles prev done PNA up to date- PNA 13 done at pharmacy per patient report in 2016 Tetanus due, d/w pt.  Colonoscopy neg 2010, d/w pt.  Would be reasonable to defer given his age and last path report.  He agrees.  Prostate cancer screening and PSA options (with potential risks and benefits of testing vs not testing) were discussed along with recent recs/guidelines.  He declined testing PSA at this point..  Advance directive- wife designated if patient were incapacitated.   Cognitive function addressed- see scanned forms- and if abnormal then additional documentation follows.

## 2015-11-05 NOTE — Telephone Encounter (Signed)
Spoke with patient's wife.  Patient takes Metoprolol Tartrate 25 mg, 0.5 mg bid.  Rx changed and re-sent to pharmacy.

## 2015-11-05 NOTE — Progress Notes (Signed)
Pre visit review using our clinic review tool, if applicable. No additional management support is needed unless otherwise documented below in the visit note.  I have personally reviewed the Medicare Annual Wellness questionnaire and have noted 1. The patient's medical and social history 2. Their use of alcohol, tobacco or illicit drugs 3. Their current medications and supplements 4. The patient's functional ability including ADL's, fall risks, home safety risks and hearing or visual             impairment. 5. Diet and physical activities 6. Evidence for depression or mood disorders  The patients weight, height, BMI have been recorded in the chart and visual acuity is per eye clinic.  I have made referrals, counseling and provided education to the patient based review of the above and I have provided the pt with a written personalized care plan for preventive services.  Provider list updated- see scanned forms.  Routine anticipatory guidance given to patient.  See health maintenance.  Flu 2016 at pharmacy Shingles prev done PNA up to date- PNA 13 done at pharmacy per patient report in 2016 Tetanus due, d/w pt.  Colonoscopy neg 2010, d/w pt.  Would be reasonable to defer given his age and last path report.  He agrees.  Prostate cancer screening and PSA options (with potential risks and benefits of testing vs not testing) were discussed along with recent recs/guidelines.  He declined testing PSA at this point..  Advance directive- wife designated if patient were incapacitated.   Cognitive function addressed- see scanned forms- and if abnormal then additional documentation follows.   Hypertension:    Using medication without problems or lightheadedness: yes Chest pain with exertion:no Edema:no Short of breath:no  Elevated Cholesterol: Using medications without problems:yes Muscle aches: no Diet compliance:yes Exercise:yes  H/o Barrett's esophagus.  No ADE on PPI but due for f/u labs.   D/w pt.  Would be reasonable to continue with his hx, instead of change to H2 blocker.   PMH and SH reviewed  Meds, vitals, and allergies reviewed.   ROS: See HPI.  Otherwise negative.    GEN: nad, alert and oriented HEENT: mucous membranes moist NECK: supple w/o LA CV: rrr. PULM: ctab, no inc wob ABD: soft, +bs EXT: no edema SKIN: no acute rash

## 2015-12-17 ENCOUNTER — Encounter: Payer: Self-pay | Admitting: Cardiovascular Disease

## 2015-12-17 ENCOUNTER — Ambulatory Visit (INDEPENDENT_AMBULATORY_CARE_PROVIDER_SITE_OTHER): Payer: Medicare Other | Admitting: Cardiovascular Disease

## 2015-12-17 VITALS — BP 140/84 | HR 72 | Ht 68.0 in | Wt 181.1 lb

## 2015-12-17 DIAGNOSIS — I10 Essential (primary) hypertension: Secondary | ICD-10-CM | POA: Diagnosis not present

## 2015-12-17 DIAGNOSIS — I2510 Atherosclerotic heart disease of native coronary artery without angina pectoris: Secondary | ICD-10-CM | POA: Diagnosis not present

## 2015-12-17 DIAGNOSIS — I25810 Atherosclerosis of coronary artery bypass graft(s) without angina pectoris: Secondary | ICD-10-CM

## 2015-12-17 DIAGNOSIS — I252 Old myocardial infarction: Secondary | ICD-10-CM

## 2015-12-17 DIAGNOSIS — Z951 Presence of aortocoronary bypass graft: Secondary | ICD-10-CM

## 2015-12-17 DIAGNOSIS — E7800 Pure hypercholesterolemia, unspecified: Secondary | ICD-10-CM

## 2015-12-17 NOTE — Patient Instructions (Signed)
You are doing well. No medication changes were made.  We will schedule a treadmill myoview for abnormal EKG, known coronary artery disease, hx of CABG  Please call us if you have new issues that need to be addressed before your next appt.  Your physician wants you to follow-up in: 6 months.  You will receive a reminder letter in the mail two months in advance. If you don't receive a letter, please call our office to schedule the follow-up appointment.

## 2015-12-17 NOTE — Assessment & Plan Note (Signed)
Cholesterol is at goal on the current lipid regimen. No changes to the medications were made.  

## 2015-12-17 NOTE — Assessment & Plan Note (Signed)
Blood pressure is well controlled on today's visit. No changes made to the medications. 

## 2015-12-17 NOTE — Assessment & Plan Note (Addendum)
Currently with no symptoms of angina.  I'm concerned about new EKG changes concerning for graft failure EKG discussed with him in detail, old EKGs pulled up for review in the office, changes noted with him. 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

## 2015-12-17 NOTE — Assessment & Plan Note (Signed)
EKG consistent with old inferior MI Now with new EKG changes noted, stress test ordered

## 2015-12-17 NOTE — Progress Notes (Signed)
Patient ID: Barry Small, male    DOB: 1934-02-02, 82 y.o.   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  No regular exercise program Compliant with his medications  EKG on today's visit shows normal sinus rhythm with new T-wave inversions V5, V6, aVF, old inferior MI, poor R-wave progression to the anterior precordial leads  Other past medical history  total cholesterol 130 range, LDL 77 (results discussed with him today)  Previous pneumonia in February 2014  His last stress nuclear study was in June of 2009. EF was normal with an old inferior wall infarct scar. There was no ischemia.   Allergies  Allergen Reactions  . Atorvastatin     REACTION: myalgia  . Morphine     REACTION: syncope  . Simvastatin     REACTION: ? reaction    Outpatient Encounter Prescriptions as of 12/17/2015  Medication Sig  . aspirin 81 MG EC tablet Take 81 mg by mouth daily.   . cholecalciferol (VITAMIN D) 1000 UNITS tablet Take 2,000 units per day.  . Coenzyme Q10 (CO Q-10) 100 MG CAPS Take 300 mg by mouth daily.  . Cyanocobalamin (VITAMIN B-12) 5000 MCG SUBL Place under the tongue daily.  Marland Kitchen lisinopril (PRINIVIL,ZESTRIL) 20 MG tablet Take 1 tablet (20 mg total) by mouth daily.  . metoprolol tartrate (LOPRESSOR) 25 MG tablet Take 0.5 tablets (12.5 mg total) by mouth 2 (two) times daily.  . Multiple Vitamin (MULTIVITAMIN) tablet Take 1 tablet by mouth daily.    . nitroGLYCERIN (NITROSTAT) 0.4 MG SL tablet DISSOLVE ONE TABLET  UNDER TONGUE EVERY 5 MINUTES AS NEEDED FOR CHESTPAIN  . omeprazole (PRILOSEC) 20 MG capsule TAKE ONE CAPSULE BY MOUTH EVERY DAY, CAN TAPER TO EVERY OTHER DAY IF TOLERATED  . rosuvastatin (CRESTOR) 20 MG tablet Take 1 tablet (20 mg total) by mouth daily.   No facility-administered encounter medications on file as of 12/17/2015.    Past Medical History  Diagnosis Date  . Coronary artery disease   . GERD (gastroesophageal reflux disease)   . Barrett's esophagus   . Colon polyps 04/2003    type unknown   . Hypertension   . Hyperlipidemia   . Elevated glucose   . Elevated bilirubin   . Pneumonia 2014  . Myocardial infarction [facility])     Past Surgical History  Procedure Laterality Date  . Coronary artery bypass graft  1989    [facility]  . Coronary angioplasty  2001    x 2, Dr.Pulsipher  . Cholecystectomy  09/2001  . Nasal sinus surgery  06/23/2008    Dr.Clark  . Esophagogastroduodenoscopy  05/09/2003    Barrett's  . Cardiolite  12/2001    h/o vessel infarct EF 66%  . Stress myoview  09/30/2004    Normal EF 70%  . Coronary angioplasty with stent placement  12/05/2005  . Stress myoview  02/11/2006    No change, c/w 09/2004  . Stress myoview  02/22/2008    Abnormal w/prior inf infarct  No signif ischemia  .  Esophagogastroduodenoscopy  11/17/08    Barrett's Esoph [facility] (Dr. Fuller Plan)  2 years  . Colonoscopy w/ polypectomy  05/09/2003    x multiple, Dr. Vira Agar  . Mch  03/28-03/31/2007    Chest pain  . Nose surgery  06/23/2008    Dr. Carlis Abbott  . Colonoscopy  11/15/2008    Mild divertics, 5 yrs, Dr. Fuller Plan    Social History  reports that he has never smoked. He has never used smokeless tobacco. He reports that he drinks alcohol. He reports that he does not use illicit drugs.  Family History family history includes Heart disease in his father; Pneumonia in his mother. There is no history of Colon cancer or Prostate cancer.   Review of Systems  Constitutional: Negative.    Respiratory: Negative.   Cardiovascular: Negative.   Gastrointestinal: Negative.   Musculoskeletal: Negative.   Skin: Negative.   Neurological: Negative.   Hematological: Negative.   Psychiatric/Behavioral: Negative.   All other systems reviewed and are negative.  BP 140/84 mmHg  Pulse 72  Ht 5\' 8"  (1.727 m)  Wt 181 lb 1.9 oz (82.155 kg)  BMI 27.55 kg/m2  SpO2 96%  Physical Exam  Constitutional: He is oriented to person, place, and time. He appears well-developed and well-nourished.  HENT:  Head: Normocephalic.  Nose: Nose normal.  Mouth/Throat: Oropharynx is clear and moist.  Eyes: Conjunctivae are normal. Pupils are equal, round, and reactive to light.  Neck: Normal range of motion. Neck supple. No JVD present.  Cardiovascular: Normal rate, regular rhythm, S1 normal, S2 normal and intact distal pulses.  Exam reveals no gallop and no friction rub.   Murmur heard.  Crescendo systolic murmur is present with a grade of 2/6  Pulmonary/Chest: Effort normal and breath sounds normal. No respiratory distress. He has no wheezes. He has no rales. He exhibits no tenderness.  Abdominal: Soft. Bowel sounds are normal. He exhibits no distension. There is no tenderness.  Musculoskeletal: Normal range of motion. He exhibits no edema or tenderness.  Lymphadenopathy:    He has no cervical adenopathy.  Neurological: He is alert and oriented to person, place, and time. Coordination normal.  Skin: Skin is warm and dry. No rash noted. No erythema.  Psychiatric: He has a normal mood and affect. His behavior is normal. Judgment and thought content normal.      Assessment and Plan   Nursing note and vitals reviewed.

## 2016-01-01 ENCOUNTER — Encounter: Payer: Self-pay | Admitting: Family Medicine

## 2016-01-01 ENCOUNTER — Ambulatory Visit (INDEPENDENT_AMBULATORY_CARE_PROVIDER_SITE_OTHER): Payer: Medicare Other | Admitting: Family Medicine

## 2016-01-01 VITALS — BP 168/82 | HR 66 | Temp 97.7°F | Wt 184.5 lb

## 2016-01-01 DIAGNOSIS — I25810 Atherosclerosis of coronary artery bypass graft(s) without angina pectoris: Secondary | ICD-10-CM

## 2016-01-01 DIAGNOSIS — R1030 Lower abdominal pain, unspecified: Secondary | ICD-10-CM

## 2016-01-01 NOTE — Progress Notes (Signed)
Pre visit review using our clinic review tool, if applicable. No additional management support is needed unless otherwise documented below in the visit note.  "I think I have colitis."  H/o similar in the past.  Starts in the AM, in the L lower abd, then migrates across the lower abd.  Then resolves.  Usually better after BM. Does better in the later portion of the day.  No diarrhea, no blood in stool.  Some occ constipation, usually regular.  In the last 2-3 weeks, he was eating more cashews and then sx started.  No other diet changes.    Prev colonoscopy with mild diverticulosis was found in the sigmoid colon. This was otherwise a normal examination.  Prev CT with a short segment of nondistended bowel involving the splenic flexure with minimal surrounding fat stranding which may represent colitis secondary to an infectious, inflammatory, or ischemic etiology, back in 2009.    Meds, vitals, and allergies reviewed.   ROS: See HPI.  Otherwise, noncontributory.  GEN: nad, alert and oriented HEENT: mucous membranes moist NECK: supple w/o LA CV: rrr.   PULM: ctab, no inc wob ABD: soft, +bs, not ttp EXT: no edema

## 2016-01-01 NOTE — Patient Instructions (Signed)
If this is colitis or diverticulitis, then it is mild.   I would cut back to clear liquids for a few days, then gradually get back to regular foods, and then update me as needed.   Take care.  Glad to see you.

## 2016-01-02 DIAGNOSIS — R1030 Lower abdominal pain, unspecified: Secondary | ICD-10-CM | POA: Insufficient documentation

## 2016-01-02 NOTE — Assessment & Plan Note (Signed)
If this is colitis or diverticulitis, then it is mild.  I would cut back to clear liquids for a few days, then gradually get back to regular foods, and then update me as needed.  He agrees.  No other med tx now.

## 2016-01-15 ENCOUNTER — Telehealth: Payer: Self-pay | Admitting: Cardiovascular Disease

## 2016-01-15 NOTE — Telephone Encounter (Signed)
Spoke w/ pt's wife.  Pt sched for ETT Myoview 01/23/16 @ 7:30. Reviewed instructions w/ her.  She verbalizes understanding and reads back instructions.  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

## 2016-01-15 NOTE — Telephone Encounter (Signed)
Pt wife if calling to schedule treadmill myoview. Please call. 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

## 2016-01-23 ENCOUNTER — Encounter
Admission: RE | Admit: 2016-01-23 | Discharge: 2016-01-23 | Disposition: A | Discharge status: Home / Self Care | Payer: Medicare Other | Source: Ambulatory Visit | Attending: Cardiovascular Disease | Admitting: Cardiovascular Disease

## 2016-01-23 DIAGNOSIS — I25810 Atherosclerosis of coronary artery bypass graft(s) without angina pectoris: Secondary | ICD-10-CM

## 2016-01-23 DIAGNOSIS — Z951 Presence of aortocoronary bypass graft: Secondary | ICD-10-CM | POA: Diagnosis not present

## 2016-01-23 LAB — NM MYOCAR MULTI W/SPECT W/WALL MOTION / EF
LV dias vol: 81 mL (ref 62–150)
LV sys vol: 38 mL
Rest HR: 75 {beats}/min
SDS: 4
SRS: 2
SSS: 6
TID: 0.71

## 2016-01-23 MED ORDER — TECHNETIUM TC 99M TETROFOSMIN IV KIT
13.0000 | PACK | Freq: Once | INTRAVENOUS | Status: AC | PRN
  Administered 2016-01-23: 13.158 via INTRAVENOUS

## 2016-01-23 MED ORDER — TECHNETIUM TC 99M TETROFOSMIN IV KIT
30.7600 | PACK | Freq: Once | INTRAVENOUS | Status: AC | PRN
  Administered 2016-01-23: 30.76 via INTRAVENOUS

## 2016-01-23 MED ORDER — REGADENOSON 0.4 MG/5ML IV SOLN
0.4000 mg | Freq: Once | INTRAVENOUS | Status: AC
  Administered 2016-01-23: 0.4 mg via INTRAVENOUS

## 2016-02-05 ENCOUNTER — Telehealth: Payer: Self-pay | Admitting: Cardiovascular Disease

## 2016-02-05 NOTE — Telephone Encounter (Signed)
Pt wife calling stating letter for DOT is not needed at the moment  Will call us back and let us know if they need it.

## 2016-02-05 NOTE — Telephone Encounter (Signed)
Pt came in today asking about his clearance letter for DOT. States he would like this letter by the end of this week. STate he will be going out of town the end of the week. Please call.

## 2016-02-05 NOTE — Telephone Encounter (Signed)
Okay to write clearance letter if patient needs clearance

## 2016-02-05 NOTE — Telephone Encounter (Signed)
Pt had [place] stress test on 01/23/16. 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

## 2016-02-06 NOTE — Telephone Encounter (Signed)
Left message for pt that letter is typed and at the front desk for him to p/u at your convenience.

## 2016-04-22 DIAGNOSIS — H2513 Age-related nuclear cataract, bilateral: Secondary | ICD-10-CM | POA: Diagnosis not present

## 2016-06-06 DIAGNOSIS — Z23 Encounter for immunization: Secondary | ICD-10-CM | POA: Diagnosis not present

## 2016-06-13 ENCOUNTER — Telehealth: Payer: Self-pay | Admitting: Cardiovascular Disease

## 2016-06-13 NOTE — Telephone Encounter (Signed)
Received records request from [employer], forwarded to [facility] for processing.

## 2016-06-16 ENCOUNTER — Ambulatory Visit (INDEPENDENT_AMBULATORY_CARE_PROVIDER_SITE_OTHER): Payer: Medicare Other | Admitting: Cardiovascular Disease

## 2016-06-16 ENCOUNTER — Encounter: Payer: Self-pay | Admitting: Cardiovascular Disease

## 2016-06-16 VITALS — BP 154/80 | HR 63 | Ht 68.0 in | Wt 175.2 lb

## 2016-06-16 DIAGNOSIS — I2510 Atherosclerotic heart disease of native coronary artery without angina pectoris: Secondary | ICD-10-CM | POA: Diagnosis not present

## 2016-06-16 DIAGNOSIS — I25810 Atherosclerosis of coronary artery bypass graft(s) without angina pectoris: Secondary | ICD-10-CM

## 2016-06-16 DIAGNOSIS — Z951 Presence of aortocoronary bypass graft: Secondary | ICD-10-CM

## 2016-06-16 DIAGNOSIS — I10 Essential (primary) hypertension: Secondary | ICD-10-CM | POA: Diagnosis not present

## 2016-06-16 DIAGNOSIS — E7800 Pure hypercholesterolemia, unspecified: Secondary | ICD-10-CM | POA: Diagnosis not present

## 2016-06-16 NOTE — Patient Instructions (Signed)

## 2016-06-16 NOTE — Progress Notes (Signed)
Cardiology Office Note  Date:  06/16/2016   ID:  Barry Small, DOB [birth year], MRN [id number]  PCP:  Elsie Stain, MD   Chief Complaint  Patient presents with  . other    6 month f/u no complaints today. Meds reviewed verbally with pt.    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 EF was normal with an old inferior wall infarct scar. There was no ischemia.   PMH:   has a past medical history of Barrett's esophagus; Colon polyps (04/2003); Coronary artery disease; Elevated bilirubin; Elevated glucose; GERD (gastroesophageal reflux disease); Hyperlipidemia; Hypertension; Myocardial infarction; and Pneumonia (2014).  PSH:    Past Surgical History:  Procedure Laterality Date  . Cardiolite  12/2001   h/o vessel infarct EF 66%  . CHOLECYSTECTOMY  09/2001  . COLONOSCOPY  11/15/2008   Mild divertics, 5 yrs, Dr. Fuller Plan  . COLONOSCOPY W/ POLYPECTOMY  05/09/2003   x multiple, Dr. Vira Agar  . CORONARY ANGIOPLASTY  2001   x 2, Dr.Pulsipher  . CORONARY ANGIOPLASTY WITH STENT PLACEMENT  12/05/2005  .  CORONARY ARTERY BYPASS GRAFT  1989   [facility]  . ESOPHAGOGASTRODUODENOSCOPY  05/09/2003   Barrett's  . ESOPHAGOGASTRODUODENOSCOPY  11/17/08   Barrett's Esoph [facility] (Dr. Fuller Plan)  2 years  . [facility]  03/28-03/31/2007   Chest pain  . NASAL SINUS SURGERY  06/23/2008   Dr.Clark  . NOSE SURGERY  06/23/2008   Dr. Carlis Abbott  . Stress Myoview  09/30/2004   Normal EF 70%  . Stress Myoview  02/11/2006   No change, c/w 09/2004  . Stress Myoview  02/22/2008   Abnormal w/prior inf infarct  No signif ischemia    Current Outpatient Prescriptions  Medication Sig Dispense Refill  . aspirin 81 MG EC tablet Take 81 mg by mouth daily.     . Cholecalciferol (VITAMIN D3) 2000 units TABS Take by mouth daily.    . Coenzyme Q10 (CO Q-10) 100 MG CAPS Take 300 mg by mouth daily.    . Cyanocobalamin (VITAMIN B-12) 5000 MCG SUBL Place under the tongue daily.    Marland Kitchen lisinopril (PRINIVIL,ZESTRIL) 20 MG tablet Take 1 tablet (20 mg total) by mouth daily. 90 tablet 3  . metoprolol tartrate (LOPRESSOR) 25 MG tablet Take 0.5 tablets (12.5 mg total) by mouth 2 (two) times daily. 90 tablet 3  . Multiple Vitamin (MULTIVITAMIN) tablet Take 1 tablet by mouth daily.      . nitroGLYCERIN (NITROSTAT) 0.4 MG SL tablet DISSOLVE ONE TABLET UNDER TONGUE EVERY 5 MINUTES AS NEEDED FOR CHESTPAIN 25 tablet 12  . rosuvastatin (CRESTOR) 20  MG tablet Take 1 tablet (20 mg total) by mouth daily. 90 tablet 3   No current facility-administered medications for this visit.      Allergies:   Atorvastatin; Morphine; and Simvastatin   Social History:  The patient  reports that he has never smoked. He has never used smokeless tobacco. He reports that he drinks alcohol. He reports that he does not use drugs.   Family History:   family history includes Heart disease in his father; Pneumonia in his mother.    Review of Systems: Review of Systems  Constitutional: Negative.   Respiratory: Negative.   Cardiovascular: Negative.    Gastrointestinal: Negative.   Musculoskeletal: Negative.   Neurological: Negative.   Psychiatric/Behavioral: Negative.   All other systems reviewed and are negative.    PHYSICAL EXAM: VS:  BP (!) 154/80 (BP Location: Left Arm, Patient Position: Sitting, Cuff Size: Normal)   Pulse 63   Ht 5\' 8"  (1.727 m)   Wt 175 lb 4 oz (79.5 kg)   BMI 26.65 kg/m  , BMI Body mass index is 26.65 kg/m. GEN: Well nourished, well developed, in no acute distress  HEENT: normal  Neck: no JVD, carotid bruits, or masses Cardiac: RRR; no murmurs, rubs, or gallops,no edema  Respiratory:  clear to auscultation bilaterally, normal work of breathing GI: soft, nontender, nondistended, + BS MS: no deformity or atrophy  Skin: warm and dry, no rash Neuro:  Strength and sensation are intact Psych: euthymic mood, full affect    Recent Labs: 11/05/2015: ALT 19; BUN 13; Creatinine, Ser 0.79; Potassium 4.6; Sodium 134    Lipid Panel Lab Results  Component Value Date   CHOL 137 11/05/2015   HDL 42.80 11/05/2015   LDLCALC 75 11/05/2015   TRIG 92.0 11/05/2015      Wt Readings from Last 3 Encounters:  06/16/16 175 lb 4 oz (79.5 kg)  01/01/16 184 lb 8 oz (83.7 kg)  12/17/15 181 lb 1.9 oz (82.2 kg)       ASSESSMENT AND PLAN:  Essential hypertension - Plan: EKG 12-Lead High on arrival, Improved on recheck, recommended he monitor blood pressure at home  Atherosclerosis of native coronary artery without angina pectoris, unspecified whether native or transplanted heart - Plan: EKG 12-Lead Old inferior MI Currently with no symptoms of angina. No further workup at this time. Continue current medication regimen.  HYPERCHOLESTEROLEMIA Cholesterol is at goal on the current lipid regimen. No changes to the medications were made.    Total encounter time more than 15 minutes  Greater than 50% was spent in counseling and coordination of care with the patient   Disposition:   F/U  6 months   Orders  Placed This Encounter  Procedures  . EKG 12-Lead     Signed, Esmond Plants, M.D., Ph.D. 06/16/2016  [place], [place]

## 2016-07-08 ENCOUNTER — Other Ambulatory Visit: Payer: Self-pay | Admitting: Cardiovascular Disease

## 2016-10-16 ENCOUNTER — Encounter: Payer: Self-pay | Admitting: Family Medicine

## 2016-11-25 ENCOUNTER — Encounter: Payer: Medicare Other | Admitting: Family Medicine

## 2016-11-30 ENCOUNTER — Other Ambulatory Visit: Payer: Self-pay | Admitting: Family Medicine

## 2016-11-30 DIAGNOSIS — I10 Essential (primary) hypertension: Secondary | ICD-10-CM

## 2016-12-02 ENCOUNTER — Ambulatory Visit (INDEPENDENT_AMBULATORY_CARE_PROVIDER_SITE_OTHER): Payer: Medicare Other

## 2016-12-02 VITALS — BP 142/90 | HR 68 | Temp 98.4°F | Ht 65.25 in | Wt 174.0 lb

## 2016-12-02 DIAGNOSIS — I10 Essential (primary) hypertension: Secondary | ICD-10-CM | POA: Diagnosis not present

## 2016-12-02 DIAGNOSIS — Z0000 Encounter for general adult medical examination without abnormal findings: Secondary | ICD-10-CM | POA: Diagnosis not present

## 2016-12-02 LAB — COMPREHENSIVE METABOLIC PANEL
ALT: 21 U/L (ref 0–53)
AST: 21 U/L (ref 0–37)
Albumin: 4.7 g/dL (ref 3.5–5.2)
Alkaline Phosphatase: 47 U/L (ref 39–117)
BUN: 10 mg/dL (ref 6–23)
CO2: 25 mEq/L (ref 19–32)
Calcium: 9.7 mg/dL (ref 8.4–10.5)
Chloride: 101 mEq/L (ref 96–112)
Creatinine, Ser: 0.69 mg/dL (ref 0.40–1.50)
GFR: 117.12 mL/min (ref >60.00)
Glucose, Bld: 97 mg/dL (ref 70–99)
Potassium: 4.1 mEq/L (ref 3.5–5.1)
Sodium: 134 mEq/L — ABNORMAL LOW (ref 135–145)
Total Bilirubin: 0.7 mg/dL (ref 0.2–1.2)
Total Protein: 7.2 g/dL (ref 6.0–8.3)

## 2016-12-02 LAB — LIPID PANEL
Cholesterol: 140 mg/dL (ref 0–200)
HDL: 41.2 mg/dL (ref >39.00)
LDL Cholesterol: 78 mg/dL (ref 0–99)
NonHDL: 98.48
Total CHOL/HDL Ratio: 3
Triglycerides: 100 mg/dL (ref 0.0–149.0)
VLDL: 20 mg/dL (ref 0.0–40.0)

## 2016-12-02 NOTE — Progress Notes (Signed)
Subjective:   Barry Small is a 83 y.o. male who presents for Medicare Annual/Subsequent preventive examination.  Review of Systems:  N/A Cardiac Risk Factors include: advanced age (>61men, >6 women);male gender;dyslipidemia;hypertension     Objective:    Vitals: BP (!) 142/90 (BP Location: Right Arm, Patient Position: Sitting, Cuff Size: Normal)   Pulse 68   Temp 98.4 F (36.9 C) (Oral)   Ht 5' 5.25" (1.657 m) Comment: no shoes  Wt 174 lb (78.9 kg)   SpO2 98%   BMI 28.73 kg/m   Body mass index is 28.73 kg/m.  Tobacco History  Smoking Status  . Never Smoker  Smokeless Tobacco  . Never Used     Counseling given: No   Past Medical History:  Diagnosis Date  . Barrett's esophagus   . Colon polyps 04/2003   type unknown   . Coronary artery disease   . Elevated bilirubin   . Elevated glucose   . GERD (gastroesophageal reflux disease)   . Hyperlipidemia   . Hypertension   . Myocardial infarction   . Pneumonia 2014   Past Surgical History:  Procedure Laterality Date  . Cardiolite  12/2001   h/o vessel infarct EF 66%  . CHOLECYSTECTOMY  09/2001  . COLONOSCOPY  11/15/2008   Mild divertics, 5 yrs, Dr. Fuller Plan  . COLONOSCOPY W/ POLYPECTOMY  05/09/2003   x multiple, Dr. Vira Agar  . CORONARY ANGIOPLASTY  2001   x 2, Dr.Pulsipher  . CORONARY ANGIOPLASTY WITH STENT PLACEMENT  12/05/2005  . CORONARY ARTERY BYPASS GRAFT  1989   [facility]  . ESOPHAGOGASTRODUODENOSCOPY  05/09/2003   Barrett's  . ESOPHAGOGASTRODUODENOSCOPY  11/17/08   Barrett's Esoph [facility] (Dr. Fuller Plan)  2 years  . [facility]  03/28-03/31/2007   Chest pain  . NASAL SINUS SURGERY  06/23/2008   Dr.Clark  . NOSE SURGERY  06/23/2008   Dr. Carlis Abbott  . Stress Myoview  09/30/2004   Normal EF 70%  . Stress Myoview  02/11/2006   No change, c/w 09/2004  . Stress Myoview  02/22/2008   Abnormal w/prior inf infarct  No signif ischemia   Family History  Problem Relation Age of Onset  . Heart disease Father     . Pneumonia Mother   . Colon cancer Neg Hx   . Prostate cancer Neg Hx    History  Sexual Activity  . Sexual activity: No    Outpatient Encounter Prescriptions as of 12/02/2016  Medication Sig  . aspirin 81 MG EC tablet Take 81 mg by mouth daily.   . Cholecalciferol (VITAMIN D3) 2000 units TABS Take by mouth daily.  . Coenzyme Q10 (CO Q-10) 100 MG CAPS Take 300 mg by mouth daily.  . Cyanocobalamin (VITAMIN B-12) 5000 MCG SUBL Place under the tongue daily.  Marland Kitchen lisinopril (PRINIVIL,ZESTRIL) 20 MG tablet Take 1 tablet (20 mg total) by mouth daily. (Patient taking differently: Take 10 mg by mouth daily. )  . metoprolol tartrate (LOPRESSOR) 25 MG tablet Take 0.5 tablets (12.5 mg total) by mouth 2 (two) times daily.  . Multiple Vitamin (MULTIVITAMIN) tablet Take 1 tablet by mouth daily.    . nitroGLYCERIN (NITROSTAT) 0.4 MG SL tablet DISSOLVE ONE TABLET UNDER TONGUE EVERY 5 MINUTES AS NEEDED FOR CHESTPAIN  . rosuvastatin (CRESTOR) 20 MG tablet Take 1 tablet (20 mg total) by mouth daily.  . [DISCONTINUED] lisinopril (PRINIVIL,ZESTRIL) 40 MG tablet TAKE 1 TABLET (40 MG TOTAL) BY MOUTH DAILY.   No facility-administered encounter medications on file  as of 12/02/2016.     Activities of Daily Living In your present state of health, do you have any difficulty performing the following activities: 12/02/2016  Hearing? N  Vision? N  Difficulty concentrating or making decisions? N  Walking or climbing stairs? N  Dressing or bathing? N  Doing errands, shopping? N  Preparing Food and eating ? N  Using the Toilet? N  In the past six months, have you accidently leaked urine? N  Do you have problems with loss of bowel control? N  Managing your Medications? N  Managing your Finances? N  Housekeeping or managing your Housekeeping? N  Some recent data might be hidden    Patient Care Team: Tonia Ghent, MD as PCP - General Minna Merritts, MD as Consulting Physician (Cardiology)   Assessment:     Hearing Screening Comments: Hearing aids Vision Screening Comments: Last vision exam in May 2017  Exercise Activities and Dietary recommendations Exercise limited by: None identified  Goals    . Increase physical activity          Starting 12/02/2016, I will continue to walk at least 30 min 3 days per week.       Fall Risk Fall Risk  12/02/2016 10/31/2014 10/21/2013  Falls in the past year? No No No   Depression Screen PHQ 2/9 Scores 12/02/2016 10/31/2014 10/21/2013  PHQ - 2 Score 0 0 0    Cognitive Function MMSE - Mini Mental State Exam 12/02/2016  Orientation to time 5  Orientation to Place 5  Registration 3  Attention/ Calculation 0  Recall 3  Language- name 2 objects 0  Language- repeat 1  Language- follow 3 step command 3  Language- read & follow direction 0  Write a sentence 0  Copy design 0  Total score 20     PLEASE NOTE: A Mini-Cog screen was completed. Maximum score is 20. A value of 0 denotes this part of Folstein MMSE was not completed or the patient failed this part of the Mini-Cog screening.   Mini-Cog Screening Orientation to Time - Max 5 pts Orientation to Place - Max 5 pts Registration - Max 3 pts Recall - Max 3 pts Language Repeat - Max 1 pts Language Follow 3 Step Command - Max 3 pts     Immunization History  Administered Date(s) Administered  . Influenza Whole 05/09/2010  . Influenza, High Dose Seasonal PF 05/28/2014  . Influenza-Unspecified 07/14/2015, 06/06/2016  . Pneumococcal Conjugate-13 09/08/2014  . Pneumococcal Polysaccharide-23 07/07/2007  . Td 06/27/2004  . Tdap 11/02/2016  . Zoster 07/14/2007   Screening Tests Health Maintenance  Topic Date Due  . COLONOSCOPY  12/02/2025 (Originally 02/25/2016)  . TETANUS/TDAP  11/02/2026  . INFLUENZA VACCINE  Addressed  . PNA vac Low Risk Adult  Completed      Plan:     I have personally reviewed and addressed the Medicare Annual Wellness questionnaire and have noted the following in the  patient's chart:  A. Medical and social history B. Use of alcohol, tobacco or illicit drugs  C. Current medications and supplements D. Functional ability and status E.  Nutritional status F.  Physical activity G. Advance directives H. List of other physicians I.  Hospitalizations, surgeries, and ER visits in previous 12 months J.  IXL to include hearing, vision, cognitive, depression L. Referrals and appointments - none  In addition, I have reviewed and discussed with patient certain preventive protocols, quality metrics, and best practice recommendations. A  written personalized care plan for preventive services as well as general preventive health recommendations were provided to patient.  See attached scanned questionnaire for additional information.   Signed,   Lindell Noe, MHA, BS, LPN Health Coach

## 2016-12-02 NOTE — Patient Instructions (Signed)
Barry Small , Thank you for taking time to come for your Medicare Wellness Visit. I appreciate your ongoing commitment to your health goals. Please review the following plan we discussed and let me know if I can assist you in the future.   These are the goals we discussed: Goals    . Increase physical activity          Starting 12/02/2016, I will continue to walk at least 30 min 3 days per week.        This is a list of the screening recommended for you and due dates:  Health Maintenance  Topic Date Due  . Colon Cancer Screening  12/02/2025*  . Tetanus Vaccine  11/02/2026  . Flu Shot  Addressed  . Pneumonia vaccines  Completed  *Topic was postponed. The date shown is not the original due date.   Preventive Care for Adults  A healthy lifestyle and preventive care can promote health and wellness. Preventive health guidelines for adults include the following key practices.  . A routine yearly physical is a good way to check with your health care provider about your health and preventive screening. It is a chance to share any concerns and updates on your health and to receive a thorough exam.  . Visit your dentist for a routine exam and preventive care every 6 months. Brush your teeth twice a day and floss once a day. Good oral hygiene prevents tooth decay and gum disease.  . The frequency of eye exams is based on your age, health, family medical history, use  of contact lenses, and other factors. Follow your health care provider's ecommendations for frequency of eye exams.  . Eat a healthy diet. Foods like vegetables, fruits, whole grains, low-fat dairy products, and lean protein foods contain the nutrients you need without too many calories. Decrease your intake of foods high in solid fats, added sugars, and salt. Eat the right amount of calories for you. Get information about a proper diet from your health care provider, if necessary.  . Regular physical exercise is one of the most  important things you can do for your health. Most adults should get at least 150 minutes of moderate-intensity exercise (any activity that increases your heart rate and causes you to sweat) each week. In addition, most adults need muscle-strengthening exercises on 2 or more days a week.  Silver Sneakers may be a benefit available to you. To determine eligibility, you may visit the website: www.silversneakers.com or contact program at (530)461-9121 Mon-Fri between 8AM-8PM.   . Maintain a healthy weight. The body mass index (BMI) is a screening tool to identify possible weight problems. It provides an estimate of body fat based on height and weight. Your health care provider can find your BMI and can help you achieve or maintain a healthy weight.   For adults 20 years and older: ? A BMI below 18.5 is considered underweight. ? A BMI of 18.5 to 24.9 is normal. ? A BMI of 25 to 29.9 is considered overweight. ? A BMI of 30 and above is considered obese.   . Maintain normal blood lipids and cholesterol levels by exercising and minimizing your intake of saturated fat. Eat a balanced diet with plenty of fruit and vegetables. Blood tests for lipids and cholesterol should begin at age 66 and be repeated every 5 years. If your lipid or cholesterol levels are high, you are over 50, or you are at high risk for heart disease, you may  need your cholesterol levels checked more frequently. Ongoing high lipid and cholesterol levels should be treated with medicines if diet and exercise are not working.  . If you smoke, find out from your health care provider how to quit. If you do not use tobacco, please do not start.  . If you choose to drink alcohol, please do not consume more than 2 drinks per day. One drink is considered to be 12 ounces (355 mL) of beer, 5 ounces (148 mL) of wine, or 1.5 ounces (44 mL) of liquor.  . If you are 65-43 years old, ask your health care provider if you should take aspirin to prevent  strokes.  . Use sunscreen. Apply sunscreen liberally and repeatedly throughout the day. You should seek shade when your shadow is shorter than you. Protect yourself by wearing long sleeves, pants, a wide-brimmed hat, and sunglasses year round, whenever you are outdoors.  . Once a month, do a whole body skin exam, using a mirror to look at the skin on your back. Tell your health care provider of new moles, moles that have irregular borders, moles that are larger than a pencil eraser, or moles that have changed in shape or color.

## 2016-12-02 NOTE — Progress Notes (Signed)
PCP notes:   Health maintenance:  Colon cancer screening - pt declined colonoscopy and requested to do fecal occult test. Kit provided to patient.   Abnormal screenings:   None  Patient concerns:   None  Nurse concerns:  None  Next PCP appt:   12/09/16 @ 1615  I reviewed health advisor's note, was available for consultation on the day of service listed in this note, and agree with documentation and plan. Elsie Stain, MD.

## 2016-12-02 NOTE — Progress Notes (Signed)
Pre visit review using our clinic review tool, if applicable. No additional management support is needed unless otherwise documented below in the visit note. 

## 2016-12-09 ENCOUNTER — Ambulatory Visit (INDEPENDENT_AMBULATORY_CARE_PROVIDER_SITE_OTHER): Payer: Medicare Other | Admitting: Family Medicine

## 2016-12-09 ENCOUNTER — Encounter: Payer: Self-pay | Admitting: Family Medicine

## 2016-12-09 DIAGNOSIS — Z860100 Personal history of colon polyps, unspecified: Secondary | ICD-10-CM

## 2016-12-09 DIAGNOSIS — Z7189 Other specified counseling: Secondary | ICD-10-CM

## 2016-12-09 DIAGNOSIS — E7800 Pure hypercholesterolemia, unspecified: Secondary | ICD-10-CM | POA: Diagnosis not present

## 2016-12-09 DIAGNOSIS — I10 Essential (primary) hypertension: Secondary | ICD-10-CM | POA: Diagnosis not present

## 2016-12-09 MED ORDER — LISINOPRIL 10 MG PO TABS: 10.0000 mg | ORAL_TABLET | Freq: Every day | ORAL | 3 refills | Status: DC

## 2016-12-09 MED ORDER — NITROGLYCERIN 0.4 MG SL SUBL: SUBLINGUAL_TABLET | SUBLINGUAL | 12 refills | Status: DC

## 2016-12-09 MED ORDER — ROSUVASTATIN CALCIUM 10 MG PO TABS: 10.0000 mg | ORAL_TABLET | Freq: Every day | ORAL | 3 refills | Status: DC

## 2016-12-09 MED ORDER — METOPROLOL TARTRATE 25 MG PO TABS: 12.5000 mg | ORAL_TABLET | Freq: Two times a day (BID) | ORAL | 3 refills | Status: DC

## 2016-12-09 NOTE — Patient Instructions (Signed)
You don't have to cut the lisinopril and crestor in half now.  Take care.  Glad to see you.  Update me as needed.

## 2016-12-09 NOTE — Progress Notes (Signed)
He has f/u re: CDL pending.  He has hearing aids, compliant.    Advance directive- wife designated if patient were incapacitated.    Colon cancer screening - pt declined colonoscopy and requested to do fecal occult test. Kit provided to patient prev.  This is pending.  D/w pt about screening options and risk and benefits.  He wanted to proceed.   He had GI upset after raw oysters.  D/w pt about avoidance, ie steamed and not raw.  Resolved now.    Elevated Cholesterol: Using medications without problems:yes Muscle aches: no Diet compliance:encouraged.   Exercise:yes, recently re-joined the gym  Hypertension:    Using medication without problems or lightheadedness: yes Chest pain with exertion:no Edema:no Short of breath:no  PMH and SH reviewed  ROS: Per HPI unless specifically indicated in ROS section   Meds, vitals, and allergies reviewed.   GEN: nad, alert and oriented HEENT: mucous membranes moist NECK: supple w/o LA CV: rrr.  PULM: ctab, no inc wob ABD: soft, +bs EXT: no edema SKIN: no acute rash

## 2016-12-09 NOTE — Progress Notes (Signed)
Pre visit review using our clinic review tool, if applicable. No additional management support is needed unless otherwise documented below in the visit note. 

## 2016-12-10 NOTE — Assessment & Plan Note (Signed)
Reasonable control. LDL slightly above goal at 78, but he recently rejoined a gym and this may help. Discussed with patient about diet and exercise. Continue current medications as is. He agrees.

## 2016-12-10 NOTE — Assessment & Plan Note (Signed)
Controlled. No adverse effect on medications. Labs discussed with patient. Continue work on diet and exercise. He agrees. >25 minutes spent in face to face time with patient, >50% spent in counselling or coordination of care.

## 2016-12-10 NOTE — Assessment & Plan Note (Signed)
Colon cancer screening - pt declined colonoscopy and requested to do fecal occult test. Kit provided to patient prev.  This is pending.  D/w pt about screening options and risk and benefits.  He wanted to proceed.

## 2016-12-10 NOTE — Assessment & Plan Note (Signed)
Advance directive- wife designated if patient were incapacitated.  

## 2016-12-11 DIAGNOSIS — H2513 Age-related nuclear cataract, bilateral: Secondary | ICD-10-CM | POA: Diagnosis not present

## 2016-12-13 NOTE — Progress Notes (Signed)
Cardiology Office Note  Date:  12/16/2016   ID:  Emett, Barry Small [birth year], MRN [id number]  PCP:  Elsie Stain, MD   Chief Complaint  Patient presents with  . other    6 month follow up. Patient states he is doing well. meds reviewed verbally.      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 EF was normal with an old inferior wall infarct scar. There was no ischemia.   PMH:   has a past medical history of Barrett's esophagus; Colon polyps (04/2003); Coronary artery disease; Elevated bilirubin; Elevated glucose; GERD (gastroesophageal reflux disease); Hyperlipidemia; Hypertension; Myocardial infarction; and Pneumonia (2014).  PSH:    Past Surgical History:  Procedure Laterality Date  . Cardiolite  12/2001   h/o vessel infarct EF 66%  . CHOLECYSTECTOMY  09/2001  . COLONOSCOPY  11/15/2008   Mild divertics, 5 yrs, Dr. Fuller Plan  . COLONOSCOPY W/ POLYPECTOMY  05/09/2003   x multiple, Dr. Vira Agar  . CORONARY ANGIOPLASTY  2001   x 2, Dr.Pulsipher  . CORONARY  ANGIOPLASTY WITH STENT PLACEMENT  12/05/2005  . CORONARY ARTERY BYPASS GRAFT  1989   [facility]  . ESOPHAGOGASTRODUODENOSCOPY  05/09/2003   Barrett's  . ESOPHAGOGASTRODUODENOSCOPY  11/17/08   Barrett's Esoph Erosive Esoph HH (Dr. Fuller Plan)  2 years  . [facility]  03/28-03/31/2007   Chest pain  . NASAL SINUS SURGERY  06/23/2008   Dr.Clark  . NOSE SURGERY  06/23/2008   Dr. Carlis Abbott  . Stress Myoview  09/30/2004   Normal EF 70%  . Stress Myoview  02/11/2006   No change, c/w 09/2004  . Stress Myoview  02/22/2008   Abnormal w/prior inf infarct  No signif ischemia    Current Outpatient Prescriptions  Medication Sig Dispense Refill  . aspirin 81 MG EC tablet Take 81 mg by mouth daily.     . Cholecalciferol (VITAMIN D3) 2000 units TABS Take by mouth daily.    . Coenzyme Q10 (CO Q-10) 100 MG CAPS Take 300 mg by mouth daily.    . Cyanocobalamin (VITAMIN B-12) 5000 MCG SUBL Place under the tongue daily.    Marland Kitchen lisinopril (PRINIVIL,ZESTRIL) 10 MG tablet Take 1 tablet (10 mg total) by mouth daily. 90 tablet 3  . metoprolol tartrate (LOPRESSOR) 25 MG tablet Take 0.5 tablets (12.5 mg total) by mouth 2 (two) times daily. 90 tablet 3  . Multiple Vitamin (MULTIVITAMIN) tablet Take 1 tablet by mouth daily.      . nitroGLYCERIN (NITROSTAT) 0.4 MG SL tablet DISSOLVE ONE TABLET UNDER TONGUE EVERY 5 MINUTES AS NEEDED  FOR CHESTPAIN 25 tablet 12  . rosuvastatin (CRESTOR) 10 MG tablet Take 1 tablet (10 mg total) by mouth daily. 90 tablet 3   No current facility-administered medications for this visit.      Allergies:   Atorvastatin; Morphine; and Simvastatin   Social History:  The patient  reports that he has never smoked. He has never used smokeless tobacco. He reports that he drinks alcohol. He reports that he does not use drugs.   Family History:   family history includes Heart disease in his father; Pneumonia in his mother.    Review of Systems: Review of Systems  Constitutional: Negative.   Respiratory: Negative.    Cardiovascular: Negative.   Gastrointestinal: Negative.   Musculoskeletal: Negative.   Neurological: Negative.   Psychiatric/Behavioral: Negative.   All other systems reviewed and are negative.    PHYSICAL EXAM: VS:  BP (!) 144/66 (BP Location: Left Arm, Patient Position: Sitting, Cuff Size: Normal)   Pulse 66   Ht 5\' 6"  (1.676 m)   Wt 175 lb 8 oz (79.6 kg)   BMI 28.33 kg/m  , BMI Body mass index is 28.33 kg/m. GEN: Well nourished, well developed, in no acute distress  HEENT: normal  Neck: no JVD, carotid bruits, or masses Cardiac: RRR; no murmurs, rubs, or gallops,no edema  Respiratory:  clear to auscultation bilaterally, normal work of breathing GI: soft, nontender, nondistended, + BS MS: no deformity or atrophy  Skin: warm and dry, no rash Neuro:  Strength and sensation are intact Psych: euthymic mood, full affect    Recent Labs: 12/02/2016: ALT 21; BUN 10; Creatinine, Ser 0.69; Potassium 4.1; Sodium 134    Lipid Panel Lab Results  Component Value Date   CHOL 140 12/02/2016   HDL 41.20 12/02/2016   LDLCALC 78 12/02/2016   TRIG 100.0 12/02/2016      Wt Readings from Last 3 Encounters:  12/16/16 175 lb 8 oz (79.6 kg)  12/09/16 174 lb 12 oz (79.3 kg)  12/02/16 174 lb (78.9 kg)       ASSESSMENT AND PLAN:   Essential hypertension - Plan: EKG 78-EUMP 536 systolic on arrival, high 130 range on my recheck No medication changes made  Atherosclerosis of native coronary artery without angina pectoris, unspecified whether native or transplanted heart - Plan: EKG 12-Lead Old inferior MI Previous stress Myoview test May 2017 showing no ischemia Currently with no symptoms of angina. No further workup at this time.  Increase Crestor  HYPERCHOLESTEROLEMIA Goal LDL less than 70, preferably 60 We will increase Crestor up to 20 mg daily  CDL renewal Discussed previous testing with him, current status, asymptomatic  no further testing needed Unless  required    Total encounter time more than 25 minutes  Greater than 50% was spent in counseling and coordination of care with the patient   Disposition:   F/U  6 months   No orders of the defined types were placed in this encounter.    Signed, Esmond Plants, M.D., Ph.D. 12/16/2016  [place], [place]

## 2016-12-16 ENCOUNTER — Encounter: Payer: Self-pay | Admitting: Cardiovascular Disease

## 2016-12-16 ENCOUNTER — Telehealth: Payer: Self-pay | Admitting: *Deleted

## 2016-12-16 ENCOUNTER — Ambulatory Visit (INDEPENDENT_AMBULATORY_CARE_PROVIDER_SITE_OTHER): Payer: Medicare Other | Admitting: Cardiovascular Disease

## 2016-12-16 VITALS — BP 138/76 | HR 66 | Ht 66.0 in | Wt 175.5 lb

## 2016-12-16 DIAGNOSIS — E7800 Pure hypercholesterolemia, unspecified: Secondary | ICD-10-CM | POA: Diagnosis not present

## 2016-12-16 DIAGNOSIS — I25118 Atherosclerotic heart disease of native coronary artery with other forms of angina pectoris: Secondary | ICD-10-CM | POA: Diagnosis not present

## 2016-12-16 DIAGNOSIS — Z951 Presence of aortocoronary bypass graft: Secondary | ICD-10-CM | POA: Diagnosis not present

## 2016-12-16 DIAGNOSIS — I209 Angina pectoris, unspecified: Secondary | ICD-10-CM

## 2016-12-16 DIAGNOSIS — I10 Essential (primary) hypertension: Secondary | ICD-10-CM | POA: Diagnosis not present

## 2016-12-16 MED ORDER — ROSUVASTATIN CALCIUM 20 MG PO TABS: 20.0000 mg | ORAL_TABLET | Freq: Every day | ORAL | 2 refills | Status: DC

## 2016-12-16 NOTE — Telephone Encounter (Signed)
Fax received indicating discrepancy in pts lisinopril Rx. Pt reports 20mg  qD, but pharmacy and med list state 10mg  qD. Attempted to contact pt, per Dr Damita Dunnings to confirm what he has actually been taking. LM on pts vm requesting a call back. Per Dr Damita Dunnings, if he is in fact taking 20mg , pls send new Rx to pharmacy for #[phone]

## 2016-12-16 NOTE — Patient Instructions (Signed)
Medication Instructions:   Please increase the crestor up to 20 mg daily  Labwork:  No new labs needed  Testing/Procedures:  No further testing at this time   I recommend watching educational videos on topics of interest to you at:       [email or web address]  Enter code: HEARTCARE    Follow-Up: It was a pleasure seeing you in the office today. Please call us if you have new issues that need to be addressed before your next appt.  [phone]  Your physician wants you to follow-up in: 6 months.  You will receive a reminder letter in the mail two months in advance. If you don't receive a letter, please call our office to schedule the follow-up appointment.  If you need a refill on your cardiac medications before your next appointment, please call your pharmacy.

## 2016-12-16 NOTE — Telephone Encounter (Signed)
Patient says that he doesn't know how this got switched to 10 mg, perhaps some time ago, he was taking 10 mg BID.  Is it ok that he continues to do that now or would it be better to take the 20 mg (two 10 mg tabs) once daily?

## 2016-12-17 MED ORDER — LISINOPRIL 20 MG PO TABS: 20.0000 mg | ORAL_TABLET | Freq: Every day | ORAL | 3 refills | Status: DC

## 2016-12-17 NOTE — Telephone Encounter (Signed)
He can take it all once a day. For clarity I would just take 20 mg tablet 1 pill a day. Prescription updated and sent. Thanks.

## 2016-12-17 NOTE — Telephone Encounter (Signed)
Patient notified as instructed by telephone and verbalized understanding. 

## 2016-12-26 ENCOUNTER — Ambulatory Visit: Payer: Medicare Other | Admitting: Cardiovascular Disease

## 2017-01-01 ENCOUNTER — Other Ambulatory Visit: Payer: Self-pay | Admitting: Family Medicine

## 2017-01-08 ENCOUNTER — Other Ambulatory Visit: Payer: Self-pay | Admitting: *Deleted

## 2017-01-21 ENCOUNTER — Other Ambulatory Visit (INDEPENDENT_AMBULATORY_CARE_PROVIDER_SITE_OTHER): Payer: Medicare Other

## 2017-01-21 DIAGNOSIS — E7800 Pure hypercholesterolemia, unspecified: Secondary | ICD-10-CM

## 2017-01-21 LAB — FECAL OCCULT BLOOD, IMMUNOCHEMICAL: Fecal Occult Bld: NEGATIVE

## 2017-06-01 DIAGNOSIS — Z23 Encounter for immunization: Secondary | ICD-10-CM | POA: Diagnosis not present

## 2017-06-10 ENCOUNTER — Encounter: Payer: Self-pay | Admitting: Family Medicine

## 2017-06-25 NOTE — Progress Notes (Signed)
Cardiology Office Note  Date:  06/26/2017   ID:  Barry Small, DOB [birth year], MRN [id number]  PCP:  Tonia Ghent, MD   Chief Complaint  Patient presents with  . other    6 month follow up. Meds reviewed by the pt. verbally. 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 EF was normal with an old inferior wall infarct scar. There was no ischemia.   PMH:   has a past medical history of Barrett's esophagus; Colon polyps (04/2003); Coronary artery disease; Elevated bilirubin; Elevated glucose; GERD (gastroesophageal reflux disease); Hyperlipidemia; Hypertension; Myocardial infarction [facility]); and Pneumonia (2014).  PSH:    Past Surgical History:  Procedure Laterality Date  . Cardiolite  12/2001   h/o vessel infarct EF 66%  . CHOLECYSTECTOMY  09/2001  . COLONOSCOPY  11/15/2008   Mild divertics, 5 yrs, Dr. Fuller Plan  . COLONOSCOPY W/ POLYPECTOMY  05/09/2003   x multiple, Dr. Vira Agar  . CORONARY ANGIOPLASTY  2001   x 2, Dr.Pulsipher  . CORONARY ANGIOPLASTY WITH STENT  PLACEMENT  12/05/2005  . CORONARY ARTERY BYPASS GRAFT  1989   [facility]  . ESOPHAGOGASTRODUODENOSCOPY  05/09/2003   Barrett's  . ESOPHAGOGASTRODUODENOSCOPY  11/17/08   Barrett's Esoph Erosive Esoph HH (Dr. Fuller Plan)  2 years  . [facility]  03/28-03/31/2007   Chest pain  . NASAL SINUS SURGERY  06/23/2008   Dr.Clark  . NOSE SURGERY  06/23/2008   Dr. Carlis Abbott  . Stress Myoview  09/30/2004   Normal EF 70%  . Stress Myoview  02/11/2006   No change, c/w 09/2004  . Stress Myoview  02/22/2008   Abnormal w/prior inf infarct  No signif ischemia    Current Outpatient Prescriptions  Medication Sig Dispense Refill  . aspirin 81 MG EC tablet Take 81 mg by mouth daily.     . Cholecalciferol (VITAMIN D3) 2000 units TABS Take by mouth daily.    . Coenzyme Q10 (CO Q-10) 100 MG CAPS Take 300 mg by mouth daily.    . Cyanocobalamin (VITAMIN B-12) 5000 MCG SUBL Place under the tongue daily.    Marland Kitchen lisinopril (PRINIVIL,ZESTRIL) 20 MG tablet Take 1 tablet (20 mg total) by mouth daily. 90 tablet 3  . metoprolol tartrate (LOPRESSOR) 25 MG tablet Take 0.5 tablets (12.5 mg total) by mouth 2 (two) times daily. 90 tablet 3  . Multiple Vitamin (MULTIVITAMIN) tablet Take 1 tablet by mouth daily.      . nitroGLYCERIN (NITROSTAT) 0.4 MG SL tablet DISSOLVE ONE TABLET UNDER TONGUE EVERY 5 MINUTES AS NEEDED FOR CHESTPAIN 25 tablet 12  .  rosuvastatin (CRESTOR) 20 MG tablet Take 1 tablet (20 mg total) by mouth daily. 90 tablet 2   No current facility-administered medications for this visit.      Allergies:   Atorvastatin; Morphine; and Simvastatin   Social History:  The patient  reports that he has never smoked. He has never used smokeless tobacco. He reports that he drinks alcohol. He reports that he does not use drugs.   Family History:   family history includes Heart disease in his father; Pneumonia in his mother.    Review of Systems: Review of Systems  Constitutional: Negative.   Respiratory: Negative.   Cardiovascular:  Negative.   Gastrointestinal: Positive for heartburn.  Musculoskeletal: Negative.   Neurological: Negative.   Psychiatric/Behavioral: Negative.   All other systems reviewed and are negative.    PHYSICAL EXAM: VS:  BP 130/80 (BP Location: Left Arm, Patient Position: Sitting, Cuff Size: Normal)   Pulse (!) 53   Ht 5\' 7"  (1.702 m)   Wt 172 lb 8 oz (78.2 kg)   BMI 27.02 kg/m  , BMI Body mass index is 27.02 kg/m. GEN: Well nourished, well developed, in no acute distress  HEENT: normal  Neck: no JVD, carotid bruits, or masses Cardiac: RRR; no murmurs, rubs, or gallops,no edema  Respiratory:  clear to auscultation bilaterally, normal work of breathing GI: soft, nontender, nondistended, + BS MS: no deformity or atrophy  Skin: warm and dry, no rash Neuro:  Strength and sensation are intact Psych: euthymic mood, full affect    Recent Labs: 12/02/2016: ALT 21; BUN 10; Creatinine, Ser 0.69; Potassium 4.1; Sodium 134    Lipid Panel Lab Results  Component Value Date   CHOL 140 12/02/2016   HDL 41.20 12/02/2016   LDLCALC 78 12/02/2016   TRIG 100.0 12/02/2016      Wt Readings from Last 3 Encounters:  06/26/17 172 lb 8 oz (78.2 kg)  12/16/16 175 lb 8 oz (79.6 kg)  12/09/16 174 lb 12 oz (79.3 kg)       ASSESSMENT AND PLAN:   Essential hypertension - Plan: EKG 12-Lead Blood pressure is well controlled on today's visit. No changes made to the medications.  Atherosclerosis of native coronary artery without angina pectoris, unspecified whether native or transplanted heart - Plan: EKG 12-Lead Old inferior MI Previous stress Myoview test May 2017 showing no ischemia Tolerating Crestor, aspirin, beta-blocker Stable anginal symptoms  HYPERCHOLESTEROLEMIA Goal LDL less than 70, preferably 60 On Crestor 20 mg daily Repeat lipid panel with primary care on annual basis  CDL renewal No further testing at this time  GERD Long discussion with him concerning his symptoms If  symptoms get worse, recommended he call our office Previously had anginal symptoms consistent with GERD requiring stent    Total encounter time more than 25 minutes  Greater than 50% was spent in counseling and coordination of care with the patient   Disposition:   F/U  12 months   Orders Placed This Encounter  Procedures  . EKG 12-Lead     Signed, Esmond Plants, M.D., Ph.D. 06/26/2017  [place], [place]

## 2017-06-26 ENCOUNTER — Ambulatory Visit (INDEPENDENT_AMBULATORY_CARE_PROVIDER_SITE_OTHER): Payer: Medicare Other | Admitting: Cardiovascular Disease

## 2017-06-26 ENCOUNTER — Encounter: Payer: Self-pay | Admitting: Cardiovascular Disease

## 2017-06-26 VITALS — BP 130/80 | HR 53 | Ht 67.0 in | Wt 172.5 lb

## 2017-06-26 DIAGNOSIS — E7800 Pure hypercholesterolemia, unspecified: Secondary | ICD-10-CM | POA: Diagnosis not present

## 2017-06-26 DIAGNOSIS — Z951 Presence of aortocoronary bypass graft: Secondary | ICD-10-CM

## 2017-06-26 DIAGNOSIS — I209 Angina pectoris, unspecified: Secondary | ICD-10-CM | POA: Diagnosis not present

## 2017-06-26 DIAGNOSIS — I25118 Atherosclerotic heart disease of native coronary artery with other forms of angina pectoris: Secondary | ICD-10-CM

## 2017-06-26 DIAGNOSIS — I10 Essential (primary) hypertension: Secondary | ICD-10-CM

## 2017-06-26 NOTE — Patient Instructions (Signed)

## 2017-07-08 ENCOUNTER — Other Ambulatory Visit: Payer: Self-pay | Admitting: Family Medicine

## 2017-07-10 ENCOUNTER — Other Ambulatory Visit: Payer: Self-pay

## 2017-07-10 MED ORDER — METOPROLOL TARTRATE 25 MG PO TABS: 12.5000 mg | ORAL_TABLET | Freq: Two times a day (BID) | ORAL | 3 refills | Status: DC

## 2017-07-23 DIAGNOSIS — Z23 Encounter for immunization: Secondary | ICD-10-CM | POA: Diagnosis not present

## 2017-10-12 ENCOUNTER — Telehealth: Payer: Self-pay | Admitting: Family Medicine

## 2017-10-12 NOTE — Telephone Encounter (Signed)
Called pt to schedule AWV anytime after 12/24/17.

## 2017-11-11 DIAGNOSIS — H25813 Combined forms of age-related cataract, bilateral: Secondary | ICD-10-CM | POA: Diagnosis not present

## 2017-11-11 DIAGNOSIS — H02413 Mechanical ptosis of bilateral eyelids: Secondary | ICD-10-CM | POA: Diagnosis not present

## 2017-11-18 ENCOUNTER — Telehealth: Payer: Self-pay | Admitting: Cardiovascular Disease

## 2017-11-18 DIAGNOSIS — I252 Old myocardial infarction: Secondary | ICD-10-CM

## 2017-11-18 NOTE — Telephone Encounter (Signed)
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

## 2017-11-18 NOTE — Telephone Encounter (Signed)
Spoke with patients wife per release form and advised that Dr. Rockey Situ is not in the office this morning but that I would review with him and call back when letter has been done. She was appreciative for the call with no further questions at this time. Will route to Dr. Rockey Situ for his review.

## 2017-11-20 NOTE — Telephone Encounter (Signed)
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  Ejection fraction every year?

## 2017-11-23 NOTE — Telephone Encounter (Signed)
Spoke with patients wife per release form and reviewed that we need to know what specific requirements are needed for his clearance. Discussed how stress test and echo may need to be repeated and/or other testing. Recommended that she contact provider that did his assessment for CDL and request what cardiac testing is needed. Provided her with our fax number for her to have them fax [place] those requirements. She verbalized understanding of our conversation, agreement with plan, and had no further questions at this time.

## 2017-11-23 NOTE — Telephone Encounter (Signed)
Spoke with patients wife per release form and reviewed that he will need exercise treadmill test prior to clearance. She verbalized understanding with no further questions at this time. Advised that I would have someone from scheduling to call and assist with getting this done. She was appreciative for the information.

## 2017-11-23 NOTE — Telephone Encounter (Signed)
Pt wife calling for an update  Please call back

## 2017-11-24 NOTE — Telephone Encounter (Signed)
Pt scheduled for 12/01/17

## 2017-11-30 ENCOUNTER — Telehealth: Payer: Self-pay | Admitting: *Deleted

## 2017-11-30 NOTE — Telephone Encounter (Signed)
Patient not home, s/w wife, ok per DPR. She verbalized understanding of [facility] appointment date and time. She will also let patient know the following:  DO NOT drink or eat foods with caffeine for 24 hours before the test. (Chocolate, coffee, tea, decaf coffee/tea, or energy drinks)  DO NOT smoke for 4 hours before your test.  If you use an inhaler, bring it with you to the test.  Wear comfortable shoes and clothing.  Do not take metoprolol tonight or in the morning.

## 2017-12-01 ENCOUNTER — Ambulatory Visit (INDEPENDENT_AMBULATORY_CARE_PROVIDER_SITE_OTHER): Payer: Medicare Other

## 2017-12-01 DIAGNOSIS — I252 Old myocardial infarction: Secondary | ICD-10-CM

## 2017-12-02 LAB — EXERCISE TOLERANCE TEST
Estimated workload: 4.6 METS
Exercise duration (min): 3 min
Exercise duration (sec): 4 s
MPHR: 139 {beats}/min
Peak HR: 142 {beats}/min
Percent HR: 102 %
RPE: 14
Rest HR: 83 {beats}/min

## 2017-12-02 NOTE — Telephone Encounter (Addendum)
Spoke with patient and his wife per release form. Reviewed that state requires certain testing and parameters are done to clear for DOT or CDL license. Patient had exercise treadmill test and did not meet requirements for certification. Per discussion with Dr. Rockey Situ patient must now have nuclear stress test. Patient and wife verbalized understanding but would like to discuss with Dr. Rockey Situ. Advised that patient will still need testing so will call him later with appointment time and then he will follow up with Dr. Rockey Situ. Reviewed all instructions for testing and advised that I would have scheduling call him to get this done. He verbalized understanding with no further questions at this time.

## 2017-12-02 NOTE — Telephone Encounter (Signed)
Spoke with patients wife per release form and advised that he did not pass testing and would need to have a nuclear stress test over at the hospital. She was agreeable with this plan and would like [place] to schedule when possible. Advised that this does not guarantee clearance and there is still a chance he may not meet requirements. She verbalized understanding and was agreeable to move forward with scheduling. Let her know that I would give her a call back with date, time, location, and instructions for his stress testing. She was appreciative for the call with no further questions at this time.

## 2017-12-04 ENCOUNTER — Encounter
Admission: RE | Admit: 2017-12-04 | Discharge: 2017-12-04 | Disposition: A | Discharge status: Home / Self Care | Payer: Medicare Other | Source: Ambulatory Visit | Attending: Cardiovascular Disease | Admitting: Cardiovascular Disease

## 2017-12-04 DIAGNOSIS — Z955 Presence of coronary angioplasty implant and graft: Secondary | ICD-10-CM | POA: Insufficient documentation

## 2017-12-04 DIAGNOSIS — Z951 Presence of aortocoronary bypass graft: Secondary | ICD-10-CM | POA: Diagnosis not present

## 2017-12-04 DIAGNOSIS — E785 Hyperlipidemia, unspecified: Secondary | ICD-10-CM | POA: Diagnosis not present

## 2017-12-04 DIAGNOSIS — I252 Old myocardial infarction: Secondary | ICD-10-CM | POA: Diagnosis not present

## 2017-12-04 DIAGNOSIS — I2510 Atherosclerotic heart disease of native coronary artery without angina pectoris: Secondary | ICD-10-CM | POA: Insufficient documentation

## 2017-12-04 DIAGNOSIS — R0602 Shortness of breath: Secondary | ICD-10-CM | POA: Diagnosis not present

## 2017-12-04 LAB — NM MYOCAR MULTI W/SPECT W/WALL MOTION / EF
LV dias vol: 116 mL (ref 62–150)
LV sys vol: 40 mL
Peak HR: 97 {beats}/min
Percent HR: 69 %
Rest HR: 66 {beats}/min
SDS: 1
SRS: 5
SSS: 4
TID: 0.89

## 2017-12-04 MED ORDER — TECHNETIUM TC 99M TETROFOSMIN IV KIT
13.8500 | PACK | Freq: Once | INTRAVENOUS | Status: AC | PRN
  Administered 2017-12-04: 13.85 via INTRAVENOUS

## 2017-12-04 MED ORDER — TECHNETIUM TC 99M TETROFOSMIN IV KIT
32.9300 | PACK | Freq: Once | INTRAVENOUS | Status: AC | PRN
  Administered 2017-12-04: 32.93 via INTRAVENOUS

## 2017-12-04 MED ORDER — REGADENOSON 0.4 MG/5ML IV SOLN
0.4000 mg | Freq: Once | INTRAVENOUS | Status: AC
  Administered 2017-12-04: 0.4 mg via INTRAVENOUS

## 2017-12-07 NOTE — Telephone Encounter (Signed)
Results reviewed with patients wife per release form and that his form has been signed for clearance. Advised that I would place up front for him to pick up. She was appreciative for the information with no further questions.

## 2017-12-11 ENCOUNTER — Encounter: Payer: Self-pay | Admitting: Family Medicine

## 2017-12-11 ENCOUNTER — Ambulatory Visit (INDEPENDENT_AMBULATORY_CARE_PROVIDER_SITE_OTHER): Payer: Medicare Other | Admitting: Family Medicine

## 2017-12-11 DIAGNOSIS — J069 Acute upper respiratory infection, unspecified: Secondary | ICD-10-CM | POA: Diagnosis not present

## 2017-12-11 NOTE — Patient Instructions (Signed)
Voice rest in the meantime.  Rest and fluids.  Gargle with salt water in the meantime.   You can try taking mucinex and take hot tea.  Update me as needed.  Take care.  Glad to see you.

## 2017-12-11 NOTE — Progress Notes (Signed)
Stated with a cough.  He is nearly 1 week into sx.  He has had waxing and waning sx in the meantime.  Some nights have been worse than others.  Some sweats recently.  He feels worse at night.  Nasal and facial congestion, L>R. Some ST.  HA, ongoing. Voice has been hoarse.  Today is better than the last few days.  Phlegm is light yellow now, improved from prev. Still with some cough.  He is clearly recently better but still wanted to get checked.  No known fevers.    Meds, vitals, and allergies reviewed.   ROS: Per HPI unless specifically indicated in ROS section   GEN: nad, alert and oriented HEENT: mucous membranes moist, tm w/o erythema, nasal exam w/o erythema, clear discharge noted,  OP with cobblestoning NECK: supple w/o LA CV: rrr.   PULM: ctab, no inc wob EXT: no edema SKIN: well perfused Sinuses not ttp x 4

## 2017-12-12 DIAGNOSIS — J069 Acute upper respiratory infection, unspecified: Secondary | ICD-10-CM | POA: Insufficient documentation

## 2017-12-12 NOTE — Assessment & Plan Note (Signed)
Likely resolving viral syndrome/cold.  Supportive treatment.  Update me as needed.  He agrees. He is clearly recently better but still wanted to get checked, discussed.  See AVS.

## 2017-12-14 DIAGNOSIS — H9193 Unspecified hearing loss, bilateral: Secondary | ICD-10-CM | POA: Diagnosis not present

## 2017-12-14 DIAGNOSIS — H5340 Unspecified visual field defects: Secondary | ICD-10-CM | POA: Diagnosis not present

## 2017-12-14 DIAGNOSIS — H02403 Unspecified ptosis of bilateral eyelids: Secondary | ICD-10-CM | POA: Diagnosis not present

## 2017-12-15 ENCOUNTER — Ambulatory Visit: Payer: Medicare Other | Admitting: Cardiovascular Disease

## 2017-12-15 DIAGNOSIS — H5340 Unspecified visual field defects: Secondary | ICD-10-CM | POA: Insufficient documentation

## 2017-12-15 DIAGNOSIS — H02403 Unspecified ptosis of bilateral eyelids: Secondary | ICD-10-CM | POA: Insufficient documentation

## 2017-12-26 ENCOUNTER — Other Ambulatory Visit: Payer: Self-pay | Admitting: Family Medicine

## 2018-01-21 ENCOUNTER — Ambulatory Visit (INDEPENDENT_AMBULATORY_CARE_PROVIDER_SITE_OTHER): Payer: Medicare Other

## 2018-01-21 VITALS — BP 132/84 | HR 65 | Temp 97.6°F | Ht 65.25 in | Wt 171.5 lb

## 2018-01-21 DIAGNOSIS — I10 Essential (primary) hypertension: Secondary | ICD-10-CM | POA: Diagnosis not present

## 2018-01-21 DIAGNOSIS — E7800 Pure hypercholesterolemia, unspecified: Secondary | ICD-10-CM | POA: Diagnosis not present

## 2018-01-21 DIAGNOSIS — Z0000 Encounter for general adult medical examination without abnormal findings: Secondary | ICD-10-CM

## 2018-01-21 LAB — COMPREHENSIVE METABOLIC PANEL
ALT: 18 U/L (ref 0–53)
AST: 20 U/L (ref 0–37)
Albumin: 4.3 g/dL (ref 3.5–5.2)
Alkaline Phosphatase: 50 U/L (ref 39–117)
BUN: 11 mg/dL (ref 6–23)
CO2: 27 mEq/L (ref 19–32)
Calcium: 9.4 mg/dL (ref 8.4–10.5)
Chloride: 99 mEq/L (ref 96–112)
Creatinine, Ser: 0.68 mg/dL (ref 0.40–1.50)
GFR: 118.77 mL/min (ref >60.00)
Glucose, Bld: 93 mg/dL (ref 70–99)
Potassium: 4 mEq/L (ref 3.5–5.1)
Sodium: 133 mEq/L — ABNORMAL LOW (ref 135–145)
Total Bilirubin: 0.5 mg/dL (ref 0.2–1.2)
Total Protein: 7.2 g/dL (ref 6.0–8.3)

## 2018-01-21 LAB — LIPID PANEL
Cholesterol: 140 mg/dL (ref 0–200)
HDL: 46.2 mg/dL (ref >39.00)
LDL Cholesterol: 77 mg/dL (ref 0–99)
NonHDL: 93.9
Total CHOL/HDL Ratio: 3
Triglycerides: 86 mg/dL (ref 0.0–149.0)
VLDL: 17.2 mg/dL (ref 0.0–40.0)

## 2018-01-21 NOTE — Progress Notes (Signed)
PCP notes:   Health maintenance:  No gaps identified.   Abnormal screenings:   None  Patient concerns:   Right shoulder injury - injured shoulder while moving furniture. PCP notified. Patient was advised per PCP to take Tylenol Extra Strength up to 3 times per day and to apply ice to site of injury. Patient verbalized understanding.  Nurse concerns:  None  Next PCP appt:   01/28/18 @ 1515  I reviewed health advisor's note, was available for consultation on the day of service listed in this note, and agree with documentation and plan. Elsie Stain, MD.

## 2018-01-21 NOTE — Patient Instructions (Addendum)
Barry Small , Thank you for taking time to come for your Medicare Wellness Visit. I appreciate your ongoing commitment to your health goals. Please review the following plan we discussed and let me know if I can assist you in the future.   These are the goals we discussed: Goals    . Increase physical activity     Starting 01/21/2018, I will continue to walk at least 60 minutes daily.        This is a list of the screening recommended for you and due dates:  Health Maintenance  Topic Date Due  . Flu Shot  04/08/2018  . Tetanus Vaccine  11/02/2026  . Pneumonia vaccines  Completed   Preventive Care for Adults  A healthy lifestyle and preventive care can promote health and wellness. Preventive health guidelines for adults include the following key practices.  . A routine yearly physical is a good way to check with your health care provider about your health and preventive screening. It is a chance to share any concerns and updates on your health and to receive a thorough exam.  . Visit your dentist for a routine exam and preventive care every 6 months. Brush your teeth twice a day and floss once a day. Good oral hygiene prevents tooth decay and gum disease.  . The frequency of eye exams is based on your age, health, family medical history, use  of contact lenses, and other factors. Follow your health care provider's recommendations for frequency of eye exams.  . Eat a healthy diet. Foods like vegetables, fruits, whole grains, low-fat dairy products, and lean protein foods contain the nutrients you need without too many calories. Decrease your intake of foods high in solid fats, added sugars, and salt. Eat the right amount of calories for you. Get information about a proper diet from your health care provider, if necessary.  . Regular physical exercise is one of the most important things you can do for your health. Most adults should get at least 150 minutes of moderate-intensity exercise  (any activity that increases your heart rate and causes you to sweat) each week. In addition, most adults need muscle-strengthening exercises on 2 or more days a week.  Silver Sneakers may be a benefit available to you. To determine eligibility, you may visit the website: www.silversneakers.com or contact program at (713)689-4954 Mon-Fri between 8AM-8PM.   . Maintain a healthy weight. The body mass index (BMI) is a screening tool to identify possible weight problems. It provides an estimate of body fat based on height and weight. Your health care provider can find your BMI and can help you achieve or maintain a healthy weight.   For adults 20 years and older: ? A BMI below 18.5 is considered underweight. ? A BMI of 18.5 to 24.9 is normal. ? A BMI of 25 to 29.9 is considered overweight. ? A BMI of 30 and above is considered obese.   . Maintain normal blood lipids and cholesterol levels by exercising and minimizing your intake of saturated fat. Eat a balanced diet with plenty of fruit and vegetables. Blood tests for lipids and cholesterol should begin at age 31 and be repeated every 5 years. If your lipid or cholesterol levels are high, you are over 50, or you are at high risk for heart disease, you may need your cholesterol levels checked more frequently. Ongoing high lipid and cholesterol levels should be treated with medicines if diet and exercise are not working.  . If  you smoke, find out from your health care provider how to quit. If you do not use tobacco, please do not start.  . If you choose to drink alcohol, please do not consume more than 2 drinks per day. One drink is considered to be 12 ounces (355 mL) of beer, 5 ounces (148 mL) of wine, or 1.5 ounces (44 mL) of liquor.  . If you are 30-32 years old, ask your health care provider if you should take aspirin to prevent strokes.  . Use sunscreen. Apply sunscreen liberally and repeatedly throughout the day. You should seek shade when your  shadow is shorter than you. Protect yourself by wearing long sleeves, pants, a wide-brimmed hat, and sunglasses year round, whenever you are outdoors.  . Once a month, do a whole body skin exam, using a mirror to look at the skin on your back. Tell your health care provider of new moles, moles that have irregular borders, moles that are larger than a pencil eraser, or moles that have changed in shape or color.

## 2018-01-21 NOTE — Progress Notes (Signed)
Subjective:   Barry Small is a 84 y.o. male who presents for Medicare Annual/Subsequent preventive examination.  Review of Systems:  N/A Cardiac Risk Factors include: advanced age (>60men, >44 women);male gender;dyslipidemia;hypertension     Objective:    Vitals: BP 132/84 (BP Location: Left Arm, Patient Position: Sitting, Cuff Size: Normal)   Pulse 65   Temp 97.6 F (36.4 C) (Oral)   Ht 5' 5.25" (1.657 m) Comment: no shoes  Wt 171 lb 8 oz (77.8 kg)   SpO2 98%   BMI 28.32 kg/m   Body mass index is 28.32 kg/m.  Advanced Directives 01/21/2018 12/02/2016  Does Patient Have a Medical Advance Directive? Yes Yes  Type of Paramedic of [place];Living will [place] in Chart? No - copy requested No - copy requested    Tobacco Social History   Tobacco Use  Smoking Status Never Smoker  Smokeless Tobacco Never Used     Counseling given: No   Clinical Intake:  Pre-visit preparation completed: Yes  Pain : 0-10 Pain Score: 8  Pain Type: Acute pain Pain Location: Shoulder Pain Orientation: Right Pain Onset: Yesterday Pain Frequency: Constant     Nutritional Status: BMI 25 -29 Overweight Nutritional Risks: None Diabetes: No  How often do you need to have someone help you when you read instructions, pamphlets, or other written materials from your doctor or pharmacy?: 1 - Never What is the last grade level you completed in school?: Associate degree  Interpreter Needed?: No  Comments: pt lives with spouse Information entered by :: LPinson, LPN  Past Medical History:  Diagnosis Date  . Barrett's esophagus   . Colon polyps 04/2003   type unknown   . Coronary artery disease   . Elevated bilirubin   . Elevated glucose   . GERD (gastroesophageal reflux disease)   . Hyperlipidemia   . Hypertension   . Myocardial infarction ([place])   . Pneumonia 2014   Past Surgical History:    Procedure Laterality Date  . Cardiolite  12/2001   h/o vessel infarct EF 66%  . CHOLECYSTECTOMY  09/2001  . COLONOSCOPY  11/15/2008   Mild divertics, 5 yrs, Dr. Fuller Plan  . COLONOSCOPY W/ POLYPECTOMY  05/09/2003   x multiple, Dr. Vira Agar  . CORONARY ANGIOPLASTY  2001   x 2, Dr.Pulsipher  . CORONARY ANGIOPLASTY WITH STENT PLACEMENT  12/05/2005  . CORONARY ARTERY BYPASS GRAFT  1989   [facility]  . ESOPHAGOGASTRODUODENOSCOPY  05/09/2003   Barrett's  . ESOPHAGOGASTRODUODENOSCOPY  11/17/08   Barrett's Esoph Erosive Esoph HH (Dr. Fuller Plan)  2 years  . [facility]  03/28-03/31/2007   Chest pain  . NASAL SINUS SURGERY  06/23/2008   Dr.Clark  . NOSE SURGERY  06/23/2008   Dr. Carlis Abbott  . Stress Myoview  09/30/2004   Normal EF 70%  . Stress Myoview  02/11/2006   No change, c/w 09/2004  . Stress Myoview  02/22/2008   Abnormal w/prior inf infarct  No signif ischemia   Family History  Problem Relation Age of Onset  . Heart disease Father   . Pneumonia Mother   . Colon cancer Neg Hx   . Prostate cancer Neg Hx    Social History   Socioeconomic History  . Marital status: Married    Spouse name: Not on file  . Number of children: 2  . Years of education: Not on file  . Highest education level: Not on file  Occupational History  . Occupation: Retired Scientist, water quality: [employer]  . Occupation: Part time    Employer: [employer]  . Financial resource strain: Not on file  . Food insecurity:    Worry: Not on file    Inability: Not on file  . Transportation needs:    Medical: Not on file    Non-medical: Not on file  Tobacco Use  . Smoking status: Never Smoker  . Smokeless tobacco: Never Used  Substance and Sexual Activity  . Alcohol use: Yes    Comment: occasional  . Drug use: No  . Sexual activity: Not Currently  Lifestyle  . Physical activity:    Days per week: Not on file    Minutes per session: Not on file  . Stress: Not on file  Relationships  . Social connections:     Talks on phone: Not on file    Gets together: Not on file    Attends religious service: Not on file    Active member of club or organization: Not on file    Attends meetings of clubs or organizations: Not on file    Relationship status: Not on file  Other Topics Concern  . Not on file  Social History Narrative   Caffeine use:  1   Minimal exercise.   Working at [employer] part-time   Retired Arts administrator.  Married to [place] in [place] Encounter Medications as of 01/21/2018  Medication Sig  . aspirin 81 MG EC tablet Take 81 mg by mouth daily.   . Cholecalciferol (VITAMIN D3) 2000 units TABS Take by mouth daily.  . Coenzyme Q10 (CO Q-10) 100 MG CAPS Take 300 mg by mouth daily.  . Cyanocobalamin (VITAMIN B-12) 5000 MCG SUBL Place under the tongue daily.  Marland Kitchen lisinopril (PRINIVIL,ZESTRIL) 20 MG tablet Take 1 tablet (20 mg total) by mouth daily.  . metoprolol tartrate (LOPRESSOR) 25 MG tablet Take 0.5 tablets (12.5 mg total) by mouth 2 (two) times daily.  . Multiple Vitamin (MULTIVITAMIN) tablet Take 1 tablet by mouth daily.    . nitroGLYCERIN (NITROSTAT) 0.4 MG SL tablet DISSOLVE ONE TABLET UNDER TONGUE EVERY 5 MINUTES AS NEEDED FOR CHESTPAIN  . omeprazole (PRILOSEC) 20 MG capsule TAKE 1 CAPSULE BY MOUTH EVERY DAY. CAN TAPER TO EVERY OTHER DAY IF TOLERATED  . rosuvastatin (CRESTOR) 20 MG tablet Take 1 tablet (20 mg total) by mouth daily.   No facility-administered encounter medications on file as of 01/21/2018.     Activities of Daily Living In your present state of health, do you have any difficulty performing the following activities: 01/21/2018  Hearing? Y  Vision? N  Difficulty concentrating or making decisions? N  Walking or climbing stairs? N  Dressing or bathing? N  Doing errands, shopping? N  Preparing Food and eating ? N  Using the Toilet? N  In the past six months, have you accidently leaked urine? N  Do you have problems with loss of bowel control? N  Managing  your Medications? N  Managing your Finances? N  Housekeeping or managing your Housekeeping? N  Some recent data might be hidden    Patient Care Team: Tonia Ghent, MD as PCP - General Rockey Situ Kathlene November, MD as Consulting Physician (Cardiology)   Assessment:   This is a routine wellness examination for Barry Small.  Hearing Screening Comments: Bilateral hearing aids Vision Screening Comments: [facility] exam in March 2019 [facility]  Exercise Activities and Dietary recommendations Current Exercise Habits: Home exercise routine, Type of exercise: walking, Time (Minutes): > 60, Frequency (Times/Week): 4, Weekly Exercise (Minutes/Week): 0, Intensity: Mild, Exercise limited by: None identified  Goals    . Increase physical activity     Starting 01/21/2018, I will continue to walk at least 60 minutes daily.        Fall Risk Fall Risk  01/21/2018 12/09/2016 12/02/2016 10/31/2014 10/21/2013  Falls in the past year? No No No No No   Depression Screen PHQ 2/9 Scores 01/21/2018 12/09/2016 12/02/2016 10/31/2014  PHQ - 2 Score 0 0 0 0  PHQ- 9 Score 0 - - -    Cognitive Function MMSE - Mini Mental State Exam 01/21/2018 12/02/2016  Orientation to time 5 5  Orientation to Place 5 5  Registration 3 3  Attention/ Calculation 0 0  Recall 3 3  Language- name 2 objects 0 0  Language- repeat 1 1  Language- follow 3 step command 3 3  Language- read & follow direction 0 0  Write a sentence 0 0  Copy design 0 0  Total score 20 20     PLEASE NOTE: A Mini-Cog screen was completed. Maximum score is 20. A value of 0 denotes this part of [place] MMSE was not completed or the patient failed this part of the Mini-Cog screening.   Mini-Cog Screening Orientation to Time - Max 5 pts Orientation to Place - Max 5 pts Registration - Max 3 pts Recall - Max 3 pts Language Repeat - Max 1 pts Language Follow 3 Step Command - Max 3 pts     Immunization History  Administered Date(s) Administered  .  Influenza Whole 05/09/2010  . Influenza, High Dose Seasonal PF 05/28/2014, 06/01/2017  . Influenza-Unspecified 07/14/2015, 06/06/2016  . Pneumococcal Conjugate-13 09/08/2014  . Pneumococcal Polysaccharide-23 07/07/2007  . Td 06/27/2004  . Tdap 11/02/2016  . Zoster 07/14/2007    Screening Tests Health Maintenance  Topic Date Due  . INFLUENZA VACCINE  04/08/2018  . TETANUS/TDAP  11/02/2026  . PNA vac Low Risk Adult  Completed       Plan:     I have personally reviewed, addressed, and noted the following in the patient's chart:  A. Medical and social history B. Use of alcohol, tobacco or illicit drugs  C. Current medications and supplements D. Functional ability and status E.  Nutritional status F.  Physical activity G. Advance directives H. List of other physicians I.  Hospitalizations, surgeries, and ER visits in previous 12 months J.  [place] to include hearing, vision, cognitive, depression L. Referrals and appointments - none  In addition, I have reviewed and discussed with patient certain preventive protocols, quality metrics, and best practice recommendations. A written personalized care plan for preventive services as well as general preventive health recommendations were provided to patient.  See attached scanned questionnaire for additional information.   Signed,   Lindell Noe, MHA, BS, LPN Health Coach

## 2018-01-28 ENCOUNTER — Ambulatory Visit (INDEPENDENT_AMBULATORY_CARE_PROVIDER_SITE_OTHER): Payer: Medicare Other | Admitting: Family Medicine

## 2018-01-28 ENCOUNTER — Encounter: Payer: Self-pay | Admitting: Family Medicine

## 2018-01-28 VITALS — BP 132/84 | HR 65 | Temp 97.6°F | Ht 65.25 in | Wt 171.5 lb

## 2018-01-28 DIAGNOSIS — K2270 Barrett's esophagus without dysplasia: Secondary | ICD-10-CM

## 2018-01-28 DIAGNOSIS — Z0000 Encounter for general adult medical examination without abnormal findings: Secondary | ICD-10-CM | POA: Insufficient documentation

## 2018-01-28 DIAGNOSIS — I10 Essential (primary) hypertension: Secondary | ICD-10-CM

## 2018-01-28 DIAGNOSIS — M25519 Pain in unspecified shoulder: Secondary | ICD-10-CM

## 2018-01-28 DIAGNOSIS — Z7189 Other specified counseling: Secondary | ICD-10-CM

## 2018-01-28 DIAGNOSIS — E7800 Pure hypercholesterolemia, unspecified: Secondary | ICD-10-CM | POA: Diagnosis not present

## 2018-01-28 MED ORDER — OMEPRAZOLE 20 MG PO CPDR: DELAYED_RELEASE_CAPSULE | ORAL | 3 refills | Status: DC

## 2018-01-28 MED ORDER — ROSUVASTATIN CALCIUM 20 MG PO TABS: 20.0000 mg | ORAL_TABLET | Freq: Every day | ORAL | 3 refills | Status: DC

## 2018-01-28 MED ORDER — LISINOPRIL 20 MG PO TABS: 20.0000 mg | ORAL_TABLET | Freq: Every day | ORAL | 3 refills | Status: DC

## 2018-01-28 MED ORDER — METOPROLOL TARTRATE 25 MG PO TABS: 12.5000 mg | ORAL_TABLET | Freq: Two times a day (BID) | ORAL | 3 refills | Status: DC

## 2018-01-28 MED ORDER — DICLOFENAC SODIUM 1 % TD GEL: 2.0000 g | Freq: Four times a day (QID) | TRANSDERMAL | 3 refills | Status: DC | PRN

## 2018-01-28 NOTE — Assessment & Plan Note (Signed)
He has hearing aids, compliant.   Advance directive- wife designated if patient were incapacitated.  Defer colonoscopy and PSA given his age.  D/w pt.  He agrees.  zostavax out of stock.

## 2018-01-28 NOTE — Assessment & Plan Note (Signed)
Advance directive- wife designated if patient were incapacitated.  

## 2018-01-28 NOTE — Assessment & Plan Note (Signed)
Improved.  Normal range of motion on internal and external rotation.  Can use Tylenol and diclofenac gel as needed.

## 2018-01-28 NOTE — Assessment & Plan Note (Signed)
Reasonable control.  No adverse effect on medication.  Labs discussed with patient.  Continue work on diet and exercise.  He agrees. No change in meds.

## 2018-01-28 NOTE — Progress Notes (Signed)
He has hearing aids, compliant.   Advance directive- wife designated if patient were incapacitated.  Defer colonoscopy and PSA given his age.  zostavax out of stock.    R shoulder pain better with tylenol and icy hot.  He had used voltaren gel in the past and needed refill to use prn.    Hypertension:    Using medication without problems or lightheadedness: yes Chest pain with exertion:no Edema:no Short of breath:no  Elevated Cholesterol: Using medications without problems:yes Muscle aches: no Diet compliance: encouraged.  Exercise: encouraged.   H/o barrett's esophagus.  No ADE on PPI.  Swallowing well.  Rare heartburn.    PMH and SH reviewed  ROS: Per HPI unless specifically indicated in ROS section   Meds, vitals, and allergies reviewed.   GEN: nad, alert and oriented HEENT: mucous membranes moist NECK: supple w/o LA CV: rrr PULM: ctab, no inc wob ABD: soft, +bs EXT: no edema SKIN: no acute rash Using hearing aids.

## 2018-01-28 NOTE — Patient Instructions (Signed)
Don't change your meds for now.  Use the gel if needed.  Take care.  Glad to see you.

## 2018-01-28 NOTE — Assessment & Plan Note (Signed)
No dysphasia.  Continue PPI.  Okay for outpatient follow-up.  Update me as needed.  He agrees.

## 2018-01-28 NOTE — Assessment & Plan Note (Signed)
.  Reasonable control.  No adverse effect on medication.  Labs discussed with patient.  Continue work on diet and exercise.  He agrees. No change in meds. >25 minutes spent in face to face time with patient, >50% spent in counselling or coordination of care, discussing hyperlipidemia, high blood pressure, Barrett's esophagus history, etc.

## 2018-02-05 DIAGNOSIS — I2510 Atherosclerotic heart disease of native coronary artery without angina pectoris: Secondary | ICD-10-CM | POA: Diagnosis not present

## 2018-02-05 DIAGNOSIS — I10 Essential (primary) hypertension: Secondary | ICD-10-CM | POA: Diagnosis not present

## 2018-02-11 DIAGNOSIS — H53453 Other localized visual field defect, bilateral: Secondary | ICD-10-CM | POA: Diagnosis not present

## 2018-02-11 DIAGNOSIS — Z974 Presence of external hearing-aid: Secondary | ICD-10-CM | POA: Diagnosis not present

## 2018-02-11 DIAGNOSIS — E7800 Pure hypercholesterolemia, unspecified: Secondary | ICD-10-CM | POA: Diagnosis not present

## 2018-02-11 DIAGNOSIS — K219 Gastro-esophageal reflux disease without esophagitis: Secondary | ICD-10-CM | POA: Diagnosis not present

## 2018-02-11 DIAGNOSIS — H9190 Unspecified hearing loss, unspecified ear: Secondary | ICD-10-CM | POA: Diagnosis not present

## 2018-02-11 DIAGNOSIS — Z951 Presence of aortocoronary bypass graft: Secondary | ICD-10-CM | POA: Diagnosis not present

## 2018-02-11 DIAGNOSIS — Z79899 Other long term (current) drug therapy: Secondary | ICD-10-CM | POA: Diagnosis not present

## 2018-02-11 DIAGNOSIS — H5340 Unspecified visual field defects: Secondary | ICD-10-CM | POA: Diagnosis not present

## 2018-02-11 DIAGNOSIS — Z955 Presence of coronary angioplasty implant and graft: Secondary | ICD-10-CM | POA: Diagnosis not present

## 2018-02-11 DIAGNOSIS — I10 Essential (primary) hypertension: Secondary | ICD-10-CM | POA: Diagnosis not present

## 2018-02-11 DIAGNOSIS — Z888 Allergy status to other drugs, medicaments and biological substances status: Secondary | ICD-10-CM | POA: Diagnosis not present

## 2018-02-11 DIAGNOSIS — I2510 Atherosclerotic heart disease of native coronary artery without angina pectoris: Secondary | ICD-10-CM | POA: Diagnosis not present

## 2018-02-11 DIAGNOSIS — H02403 Unspecified ptosis of bilateral eyelids: Secondary | ICD-10-CM | POA: Diagnosis not present

## 2018-02-11 DIAGNOSIS — Z7982 Long term (current) use of aspirin: Secondary | ICD-10-CM | POA: Diagnosis not present

## 2018-02-11 DIAGNOSIS — Z85828 Personal history of other malignant neoplasm of skin: Secondary | ICD-10-CM | POA: Diagnosis not present

## 2018-03-24 ENCOUNTER — Ambulatory Visit: Payer: Medicare Other

## 2018-03-26 ENCOUNTER — Encounter: Payer: Medicare Other | Admitting: Family Medicine

## 2018-04-28 DIAGNOSIS — Z23 Encounter for immunization: Secondary | ICD-10-CM | POA: Diagnosis not present

## 2018-05-14 ENCOUNTER — Encounter: Payer: Self-pay | Admitting: Family Medicine

## 2018-05-25 DIAGNOSIS — H02839 Dermatochalasis of unspecified eye, unspecified eyelid: Secondary | ICD-10-CM | POA: Diagnosis not present

## 2018-05-25 DIAGNOSIS — J310 Chronic rhinitis: Secondary | ICD-10-CM | POA: Diagnosis not present

## 2018-07-07 DIAGNOSIS — M9903 Segmental and somatic dysfunction of lumbar region: Secondary | ICD-10-CM | POA: Diagnosis not present

## 2018-07-07 DIAGNOSIS — M25552 Pain in left hip: Secondary | ICD-10-CM | POA: Diagnosis not present

## 2018-07-07 DIAGNOSIS — M5442 Lumbago with sciatica, left side: Secondary | ICD-10-CM | POA: Diagnosis not present

## 2018-08-03 ENCOUNTER — Telehealth: Payer: Self-pay | Admitting: Cardiovascular Disease

## 2018-08-03 NOTE — Telephone Encounter (Signed)
Patient spouse calling asking about the Lisinopril She states they are reading a lot of issues with regarding that medication  Would like to make sure that there isn't a recall or anything on this medication   Please call back

## 2018-08-03 NOTE — Telephone Encounter (Signed)
No answer. Left detailed message ok per DPR with the recommendation to call the pharmacy to see if lisinopril is recalled or if there are any other issues with the pill. Then they can call us back with further details or if they have any further questions.

## 2018-08-17 ENCOUNTER — Ambulatory Visit (INDEPENDENT_AMBULATORY_CARE_PROVIDER_SITE_OTHER): Payer: Medicare Other | Admitting: Family Medicine

## 2018-08-17 ENCOUNTER — Encounter

## 2018-08-17 ENCOUNTER — Encounter: Payer: Self-pay | Admitting: Family Medicine

## 2018-08-17 DIAGNOSIS — K5900 Constipation, unspecified: Secondary | ICD-10-CM | POA: Diagnosis not present

## 2018-08-17 MED ORDER — POLYETHYLENE GLYCOL 3350 17 GM/SCOOP PO POWD: 17.0000 g | Freq: Every day | ORAL | Status: DC | PRN

## 2018-08-17 NOTE — Progress Notes (Signed)
BP controlled, 120s/70s on home checks.   GI sx.  He had been eating more fiber/veggies and spicy foods recently.  He had some constipation.  No blood in stool but had hemorrhoid irritation.  No black stools.    He has h/o constipation recently but normal BM this AM.  No diarrhea.  When he has sx, he has cramping in the LLQ.  No pain now.  "Today is a good day."  OTC gas-x helped.    He reports FH IBS, his mother, aunts had similar.    Meds, vitals, and allergies reviewed.   ROS: Per HPI unless specifically indicated in ROS section   GEN: nad, alert and oriented HEENT: mucous membranes moist NECK: supple w/o LA CV: rrr PULM: ctab, no inc wob ABD: soft, +bs EXT: no edema SKIN: no acute rash

## 2018-08-17 NOTE — Patient Instructions (Signed)
Try taking miralax daily if needed. You may not need it every day.   You can get that over the counter.  You can still take gas x with it if needed.  It you keep having trouble then let me know.   Take care.  Glad to see you.

## 2018-08-18 DIAGNOSIS — K5900 Constipation, unspecified: Secondary | ICD-10-CM | POA: Insufficient documentation

## 2018-08-18 NOTE — Assessment & Plan Note (Signed)
Episodic, family history of similar. No blood in stool.  No alarming symptoms.  He had a recent bowel movement and has benign abdominal exam at this point.  Discussed options.  Would defer referral for endoscopy at this point given his age and lack of alarming symptoms.  Okay for outpatient follow-up. He can try taking miralax daily if needed.  He may not need it every day.   He can still take gas x with it if needed.  He will let me know if he keeps having trouble.

## 2018-08-23 DIAGNOSIS — H25013 Cortical age-related cataract, bilateral: Secondary | ICD-10-CM | POA: Diagnosis not present

## 2018-08-23 DIAGNOSIS — H2512 Age-related nuclear cataract, left eye: Secondary | ICD-10-CM | POA: Diagnosis not present

## 2018-08-23 DIAGNOSIS — H2513 Age-related nuclear cataract, bilateral: Secondary | ICD-10-CM | POA: Diagnosis not present

## 2018-08-23 DIAGNOSIS — H25043 Posterior subcapsular polar age-related cataract, bilateral: Secondary | ICD-10-CM | POA: Diagnosis not present

## 2018-08-24 DIAGNOSIS — J310 Chronic rhinitis: Secondary | ICD-10-CM | POA: Diagnosis not present

## 2018-09-30 ENCOUNTER — Telehealth: Payer: Self-pay | Admitting: Cardiovascular Disease

## 2018-09-30 NOTE — Telephone Encounter (Signed)
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  Form placed in nurse box .  They are needing in addition to form :  Last ov note  ETT/ Stress test in last 2 yrs includes EF  New Clearance Letter  Cardio DX CAD? HTN? MI ?

## 2018-09-30 NOTE — Telephone Encounter (Signed)
Spoke with patient. He has not been seen since 06/2017. Needs follow scheduled.  Opening on 10/04/18 and patient agreeable. Patient aware he will address DOT at that time.

## 2018-10-03 DIAGNOSIS — I25118 Atherosclerotic heart disease of native coronary artery with other forms of angina pectoris: Secondary | ICD-10-CM | POA: Insufficient documentation

## 2018-10-03 DIAGNOSIS — E782 Mixed hyperlipidemia: Secondary | ICD-10-CM | POA: Insufficient documentation

## 2018-10-03 NOTE — Progress Notes (Signed)
Cardiology Office Note  Date:  10/04/2018   ID:  Barry, Small 25-Apr-1934, MRN [id number]  PCP:  Tonia Ghent, MD   Chief Complaint  Patient presents with  . other    12 month follow up. Meds reviewed by the pt. verbally. "doing well." Pt. needs a CDL clearance.     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 Resting EKG with old inferior MI  Lab work reviewed with him Total chol 140, LDL 77 in 2019  EKG personally reviewed by myself on todays visit Shows sinus bradycardia rate 59 bpm old inferior MI No significant change home  Other past medical history Previous pneumonia in February 2014  His last stress nuclear study was in May 2017. EF was normal with an old inferior wall infarct scar. There was no ischemia.   PMH:   has a past medical history of Barrett's esophagus, Colon polyps (04/2003), Coronary artery disease, Elevated bilirubin, Elevated glucose, GERD (gastroesophageal reflux disease),  Hyperlipidemia, Hypertension, Myocardial infarction ([place]), and Pneumonia (2014).  PSH:    Past Surgical History:  Procedure Laterality Date  . Cardiolite  12/2001   h/o vessel infarct EF 66%  . CHOLECYSTECTOMY  09/2001  . COLONOSCOPY  11/15/2008   Mild divertics, 5 yrs, Dr. Fuller Plan  . COLONOSCOPY W/ POLYPECTOMY  05/09/2003   x multiple, Dr. Vira Agar  . CORONARY ANGIOPLASTY  2001   x 2, Dr.Pulsipher  . CORONARY ANGIOPLASTY WITH STENT PLACEMENT  12/05/2005  . CORONARY ARTERY BYPASS GRAFT  1989   [facility]  . ESOPHAGOGASTRODUODENOSCOPY  05/09/2003   Barrett's  . ESOPHAGOGASTRODUODENOSCOPY  11/17/08   Barrett's Esoph Erosive Esoph HH (Dr. Fuller Plan)  2 years  . [facility]  03/28-03/31/2007   Chest pain  . NASAL SINUS SURGERY  06/23/2008   Dr.Clark  . NOSE SURGERY  06/23/2008   Dr. Carlis Abbott  . Stress Myoview  09/30/2004   Normal EF 70%  . Stress Myoview  02/11/2006   No change, c/w 09/2004  . Stress Myoview  02/22/2008   Abnormal w/prior inf infarct  No signif ischemia    Current Outpatient Medications  Medication Sig Dispense Refill  . aspirin 81 MG EC tablet Take 81 mg by mouth daily.     . Cholecalciferol (VITAMIN D3) 2000 units TABS Take by mouth daily.    . Coenzyme Q10 (CO Q-10) 100 MG CAPS Take 300 mg by mouth daily.    . Cyanocobalamin (VITAMIN B-12) 5000 MCG SUBL Place under  the tongue daily.    Marland Kitchen lisinopril (PRINIVIL,ZESTRIL) 20 MG tablet Take 1 tablet (20 mg total) by mouth daily. 90 tablet 3  . metoprolol tartrate (LOPRESSOR) 25 MG tablet Take 0.5 tablets (12.5 mg total) by mouth 2 (two) times daily. 90 tablet 3  . Multiple Vitamin (MULTIVITAMIN) tablet Take 1 tablet by mouth daily.      . nitroGLYCERIN (NITROSTAT) 0.4 MG SL tablet DISSOLVE ONE TABLET UNDER TONGUE EVERY 5 MINUTES AS NEEDED FOR CHESTPAIN 25 tablet 12  . omeprazole (PRILOSEC) 20 MG capsule TAKE 1 CAPSULE BY MOUTH EVERY DAY. CAN TAPER TO EVERY OTHER DAY IF TOLERATED 90 capsule 3  . polyethylene glycol powder  (GLYCOLAX/MIRALAX) powder Take 17 g by mouth daily as needed for mild constipation.    . rosuvastatin (CRESTOR) 20 MG tablet Take 1 tablet (20 mg total) by mouth daily. 90 tablet 3   No current facility-administered medications for this visit.      Allergies:   Atorvastatin; Morphine; and Simvastatin   Social History:  The patient  reports that he has never smoked. He has never used smokeless tobacco. He reports current alcohol use. He reports that he does not use drugs.   Family History:   family history includes Heart disease in his father; Pneumonia in his mother.    Review of Systems: Review of Systems  Constitutional: Negative.   Respiratory: Negative.   Cardiovascular: Negative.   Gastrointestinal: Negative.   Musculoskeletal: Negative.   Neurological: Negative.   Psychiatric/Behavioral: Negative.   All other systems reviewed and are negative.    PHYSICAL EXAM: VS:  BP (!) 150/64 (BP Location: Left Arm, Patient Position: Sitting, Cuff Size: Normal)   Pulse (!) 59   Ht 5\' 7"  (1.702 m)   Wt 178 lb (80.7 kg)   BMI 27.88 kg/m  , BMI Body mass index is 27.88 kg/m. Constitutional:  oriented to person, place, and time. No distress.  HENT:  Head: Grossly normal Eyes:  no discharge. No scleral icterus.  Neck: No JVD, no carotid bruits  Cardiovascular: Regular rate and rhythm, no murmurs appreciated Pulmonary/Chest: Clear to auscultation bilaterally, no wheezes or rails Abdominal: Soft.  no distension.  no tenderness.  Musculoskeletal: Normal range of motion Neurological:  normal muscle tone. Coordination normal. No atrophy Skin: Skin warm and dry Psychiatric: normal affect, pleasant  Recent Labs: 01/21/2018: ALT 18; BUN 11; Creatinine, Ser 0.68; Potassium 4.0; Sodium 133    Lipid Panel Lab Results  Component Value Date   CHOL 140 01/21/2018   HDL 46.20 01/21/2018   LDLCALC 77 01/21/2018   TRIG 86.0 01/21/2018    Wt Readings from Last 3 Encounters:  10/04/18  178 lb (80.7 kg)  08/17/18 175 lb 4 oz (79.5 kg)  01/28/18 171 lb 8 oz (77.8 kg)     ASSESSMENT AND PLAN:  Essential hypertension - Plan: EKG 12-Lead Blood pressure elevated on today's visit Recommended he increase the dosing up to 20 mg daily Reports he has been cutting the pill in half Suggested he monitor blood pressure at home and if it continues to run high greater than 970 systolic suggested he call our office for further medication titration  Atherosclerosis of native coronary artery without angina pectoris, unspecified whether native or transplanted heart - Old inferior MI Previous stress Myoview test May 2017 showing no ischemia Repeat stress test March 2019 again with no significant ischemia Tolerating Crestor, aspirin, beta-blocker, ACE inhibitor No change in clinical status, denies chest pain concerning for angina  HYPERCHOLESTEROLEMIA Goal LDL less than 70, preferably 60 On Crestor 20 mg daily LDL slightly above goal, recommended he work on his diet  CDL renewal No further testing at this time Will look for the paperwork   Total encounter time more than 25 minutes  Greater than 50% was spent in counseling and coordination of care with the patient   Disposition:   F/U  12 months   Orders Placed This Encounter  Procedures  . EKG 12-Lead     Signed, Esmond Plants, M.D., Ph.D. 10/04/2018  [place], [place]

## 2018-10-04 ENCOUNTER — Encounter: Payer: Self-pay | Admitting: Cardiovascular Disease

## 2018-10-04 ENCOUNTER — Ambulatory Visit (INDEPENDENT_AMBULATORY_CARE_PROVIDER_SITE_OTHER): Payer: Medicare Other | Admitting: Cardiovascular Disease

## 2018-10-04 VITALS — BP 150/64 | HR 59 | Ht 67.0 in | Wt 178.0 lb

## 2018-10-04 DIAGNOSIS — I10 Essential (primary) hypertension: Secondary | ICD-10-CM

## 2018-10-04 DIAGNOSIS — Z951 Presence of aortocoronary bypass graft: Secondary | ICD-10-CM | POA: Diagnosis not present

## 2018-10-04 DIAGNOSIS — I25118 Atherosclerotic heart disease of native coronary artery with other forms of angina pectoris: Secondary | ICD-10-CM | POA: Diagnosis not present

## 2018-10-04 DIAGNOSIS — E7800 Pure hypercholesterolemia, unspecified: Secondary | ICD-10-CM | POA: Diagnosis not present

## 2018-10-04 DIAGNOSIS — E782 Mixed hyperlipidemia: Secondary | ICD-10-CM

## 2018-10-04 NOTE — Patient Instructions (Addendum)
Monitor the blood pressure at home Call if >140 on the top number  Medication Instructions:  Take a lisinopril 20 daily  If you need a refill on your cardiac medications before your next appointment, please call your pharmacy.    Lab work: No new labs needed   If you have labs (blood work) drawn today and your tests are completely normal, you will receive your results only by: Marland Kitchen MyChart Message (if you have MyChart) OR . A paper copy in the mail If you have any lab test that is abnormal or we need to change your treatment, we will call you to review the results.   Testing/Procedures: No new testing needed   Follow-Up: At [facility], you and your health needs are our priority.  As part of our continuing mission to provide you with exceptional heart care, we have created designated Provider Care Teams.  These Care Teams include your primary Cardiologist (physician) and Advanced Practice Providers (APPs -  Physician Assistants and Nurse Practitioners) who all work together to provide you with the care you need, when you need it.  . You will need a follow up appointment in 12 months .   Please call our office 2 months in advance to schedule this appointment.    . Providers on your designated Care Team:   . Murray Hodgkins, NP . Christell Faith, PA-C . Marrianne Mood, PA-C  Any Other Special Instructions Will Be Listed Below (If Applicable).  For educational health videos Log in to : [email or web address] Or : [email or web address], password : [email or web address]

## 2018-10-05 DIAGNOSIS — H25812 Combined forms of age-related cataract, left eye: Secondary | ICD-10-CM | POA: Diagnosis not present

## 2018-10-05 DIAGNOSIS — H2512 Age-related nuclear cataract, left eye: Secondary | ICD-10-CM | POA: Diagnosis not present

## 2018-10-05 NOTE — Telephone Encounter (Signed)
Form that patient dropped off last week was old form completed back last year on 12/07/2017. No new blank form was brought in to complete. Left voicemail message for patient to call back so that we can discuss his clearance.

## 2018-10-05 NOTE — Telephone Encounter (Signed)
Spoke with patient and reviewed that form we currently have was a previous one from last year and we need a blank one in order to sign. He verbalized understanding with no further questions at this time.

## 2018-10-06 DIAGNOSIS — H25011 Cortical age-related cataract, right eye: Secondary | ICD-10-CM | POA: Diagnosis not present

## 2018-10-06 DIAGNOSIS — H25041 Posterior subcapsular polar age-related cataract, right eye: Secondary | ICD-10-CM | POA: Diagnosis not present

## 2018-10-06 DIAGNOSIS — H2511 Age-related nuclear cataract, right eye: Secondary | ICD-10-CM | POA: Diagnosis not present

## 2018-10-06 NOTE — Telephone Encounter (Signed)
Patient came by office Dropped off forms to be reviewed and completed  Placed in nurse box

## 2018-10-06 NOTE — Telephone Encounter (Signed)
Routing to [employer]. She is aware.

## 2018-10-12 DIAGNOSIS — H25811 Combined forms of age-related cataract, right eye: Secondary | ICD-10-CM | POA: Diagnosis not present

## 2018-10-12 DIAGNOSIS — H2511 Age-related nuclear cataract, right eye: Secondary | ICD-10-CM | POA: Diagnosis not present

## 2018-10-13 NOTE — Telephone Encounter (Signed)
Spoke with Willeen Niece, RN. Dr Rockey Situ has forms and is carefully reviewing them. Patient's wife notified that it is in the process and we will let them know what ready. She verbalized understanding.

## 2018-10-13 NOTE — Telephone Encounter (Signed)
Patient wife calling to check on status of form completion   Also wanted to make Pam aware his BP is down to 138/63 Please call to discuss

## 2018-10-14 NOTE — Telephone Encounter (Signed)
Spoke with patients wife per release form and reviewed that we need patient to come in for blood pressure before we can complete work. Verified his medications and advised that he should make sure to take them. Reviewed appointment date and time for tomorrow at 11:30AM here in our office. She verbalized understanding, confirmed appointment, and had no further questions at this time. Paperwork placed in "MD Sign" folder pending blood pressure readings.

## 2018-10-15 ENCOUNTER — Ambulatory Visit: Payer: Medicare Other | Admitting: *Deleted

## 2018-10-15 ENCOUNTER — Encounter: Payer: Self-pay | Admitting: *Deleted

## 2018-10-15 VITALS — BP 136/76 | HR 71 | Ht 66.0 in | Wt 171.1 lb

## 2018-10-15 DIAGNOSIS — I10 Essential (primary) hypertension: Secondary | ICD-10-CM

## 2018-10-15 NOTE — Progress Notes (Signed)
1.) Reason for visit: Blood pressure check for DOT clearance  2.) Name of MD requesting visit: Dr. Rockey Situ  3.) Assessment and plan per MD: The patient's blood pressure was 136/76 and heart rate was 71. Goal was systolic less than 844.  Medications were reviewed with him. He did take his medications this morning. He has been advised to stay on the Lisinopril 20 mg daily.    The patient has been advised the paperwork will be                     addressed and the nurse will call him back with an                     update.

## 2018-10-15 NOTE — Telephone Encounter (Signed)
[employer] calling in regards to clearance for DOT States they do not use Epic so they are unable to see results of tests Please fax to [phone]

## 2018-10-15 NOTE — Telephone Encounter (Signed)
Stress test was attached to previous fax but resent again just to make sure they received it and left voicemail message that it was on that previous fax as well.

## 2018-10-15 NOTE — Telephone Encounter (Signed)
Spoke with patients wife per release form and reviewed paperwork has been completed and faxed over to NP's office for clearance. She was appreciative for the call with no further questions.

## 2018-11-27 ENCOUNTER — Other Ambulatory Visit: Payer: Self-pay | Admitting: Family Medicine

## 2018-11-29 NOTE — Telephone Encounter (Signed)
Electronic refill request. Nitrostat 0.4 mg Last office visit:   08/17/2018 Last Filled:    25 tablet 12 12/09/2016  Medication expired Please advise.

## 2018-11-30 NOTE — Telephone Encounter (Signed)
Sent. Thanks.   

## 2019-02-11 ENCOUNTER — Ambulatory Visit (INDEPENDENT_AMBULATORY_CARE_PROVIDER_SITE_OTHER): Payer: Medicare Other

## 2019-02-11 VITALS — Wt 168.0 lb

## 2019-02-11 DIAGNOSIS — Z0000 Encounter for general adult medical examination without abnormal findings: Secondary | ICD-10-CM | POA: Diagnosis not present

## 2019-02-11 NOTE — Patient Instructions (Signed)
Barry Small , Thank you for taking time to come for your Medicare Wellness Visit. I appreciate your ongoing commitment to your health goals. Please review the following plan we discussed and let me know if I can assist you in the future.   These are the goals we discussed: Goals    . Increase physical activity     Starting 02/11/2019, I will continue to walk at least 60 minutes daily.        This is a list of the screening recommended for you and due dates:  Health Maintenance  Topic Date Due  . Flu Shot  04/09/2019  . Tetanus Vaccine  11/02/2026  . Pneumonia vaccines  Completed   Preventive Care for Adults  A healthy lifestyle and preventive care can promote health and wellness. Preventive health guidelines for adults include the following key practices.  . A routine yearly physical is a good way to check with your health care provider about your health and preventive screening. It is a chance to share any concerns and updates on your health and to receive a thorough exam.  . Visit your dentist for a routine exam and preventive care every 6 months. Brush your teeth twice a day and floss once a day. Good oral hygiene prevents tooth decay and gum disease.  . The frequency of eye exams is based on your age, health, family medical history, use  of contact lenses, and other factors. Follow your health care provider's recommendations for frequency of eye exams.  . Eat a healthy diet. Foods like vegetables, fruits, whole grains, low-fat dairy products, and lean protein foods contain the nutrients you need without too many calories. Decrease your intake of foods high in solid fats, added sugars, and salt. Eat the right amount of calories for you. Get information about a proper diet from your health care provider, if necessary.  . Regular physical exercise is one of the most important things you can do for your health. Most adults should get at least 150 minutes of moderate-intensity exercise  (any activity that increases your heart rate and causes you to sweat) each week. In addition, most adults need muscle-strengthening exercises on 2 or more days a week.  Silver Sneakers may be a benefit available to you. To determine eligibility, you may visit the website: www.silversneakers.com or contact program at 417-504-6434 Mon-Fri between 8AM-8PM.   . Maintain a healthy weight. The body mass index (BMI) is a screening tool to identify possible weight problems. It provides an estimate of body fat based on height and weight. Your health care provider can find your BMI and can help you achieve or maintain a healthy weight.   For adults 20 years and older: ? A BMI below 18.5 is considered underweight. ? A BMI of 18.5 to 24.9 is normal. ? A BMI of 25 to 29.9 is considered overweight. ? A BMI of 30 and above is considered obese.   . Maintain normal blood lipids and cholesterol levels by exercising and minimizing your intake of saturated fat. Eat a balanced diet with plenty of fruit and vegetables. Blood tests for lipids and cholesterol should begin at age 85 and be repeated every 5 years. If your lipid or cholesterol levels are high, you are over 50, or you are at high risk for heart disease, you may need your cholesterol levels checked more frequently. Ongoing high lipid and cholesterol levels should be treated with medicines if diet and exercise are not working.  . If  you smoke, find out from your health care provider how to quit. If you do not use tobacco, please do not start.  . If you choose to drink alcohol, please do not consume more than 2 drinks per day. One drink is considered to be 12 ounces (355 mL) of beer, 5 ounces (148 mL) of wine, or 1.5 ounces (44 mL) of liquor.  . If you are 30-32 years old, ask your health care provider if you should take aspirin to prevent strokes.  . Use sunscreen. Apply sunscreen liberally and repeatedly throughout the day. You should seek shade when your  shadow is shorter than you. Protect yourself by wearing long sleeves, pants, a wide-brimmed hat, and sunglasses year round, whenever you are outdoors.  . Once a month, do a whole body skin exam, using a mirror to look at the skin on your back. Tell your health care provider of new moles, moles that have irregular borders, moles that are larger than a pencil eraser, or moles that have changed in shape or color.

## 2019-02-11 NOTE — Progress Notes (Signed)
PCP notes:   Health maintenance:  None  Abnormal screenings:   None  Patient concerns:   None  Nurse concerns:  None  Next PCP appt:   02/15/19 @ 0815  I reviewed health advisor's note, was available for consultation on the day of service listed in this note, and agree with documentation and plan. Elsie Stain, MD.

## 2019-02-11 NOTE — Progress Notes (Signed)
Subjective:   Barry Small is a 85 y.o. male who presents for Medicare Annual/Subsequent preventive examination.  Review of Systems:  N/A Cardiac Risk Factors include: advanced age (>41men, >46 women);dyslipidemia;hypertension;male gender     Objective:    Vitals: Wt 168 lb (76.2 kg) Comment: patient supplied  BMI 27.12 kg/m   Body mass index is 27.12 kg/m.  Advanced Directives 02/11/2019 01/21/2018 12/02/2016  Does Patient Have a Medical Advance Directive? Yes Yes Yes  Type of Paramedic of [place];Living will [place];Living will [place] in Chart? No - copy requested No - copy requested No - copy requested    Tobacco Social History   Tobacco Use  Smoking Status Never Smoker  Smokeless Tobacco Never Used     Counseling given: No   Clinical Intake:  Pre-visit preparation completed: Yes  Pain : No/denies pain Pain Score: 0-No pain     Nutritional Status: BMI 25 -29 Overweight Nutritional Risks: None Diabetes: No  How often do you need to have someone help you when you read instructions, pamphlets, or other written materials from your doctor or pharmacy?: 1 - Never What is the last grade level you completed in school?: Associate degree  Interpreter Needed?: No  Comments: pt lives with spouse Information entered by :: LPinson, LPN  Past Medical History:  Diagnosis Date  . Barrett's esophagus   . Colon polyps 04/2003   type unknown   . Coronary artery disease   . Elevated bilirubin   . Elevated glucose   . GERD (gastroesophageal reflux disease)   . Hyperlipidemia   . Hypertension   . Myocardial infarction ([place])   . Pneumonia 2014   Past Surgical History:  Procedure Laterality Date  . Ward  08/2018  . Cardiolite  12/2001   h/o vessel infarct EF 66%  . CATARACT EXTRACTION W/ INTRAOCULAR LENS  IMPLANT, BILATERAL  09/2018  .  CHOLECYSTECTOMY  09/2001  . COLONOSCOPY  11/15/2008   Mild divertics, 5 yrs, Dr. Fuller Plan  . COLONOSCOPY W/ POLYPECTOMY  05/09/2003   x multiple, Dr. Vira Agar  . CORONARY ANGIOPLASTY  2001   x 2, Dr.Pulsipher  . CORONARY ANGIOPLASTY WITH STENT PLACEMENT  12/05/2005  . CORONARY ARTERY BYPASS GRAFT  1989   [facility]  . ESOPHAGOGASTRODUODENOSCOPY  05/09/2003   Barrett's  . ESOPHAGOGASTRODUODENOSCOPY  11/17/08   Barrett's Esoph Erosive Esoph HH (Dr. Fuller Plan)  2 years  . EYE SURGERY  09/2018   cataract extraction  . [facility]  03/28-03/31/2007   Chest pain  . NASAL SINUS SURGERY  06/23/2008   Dr.Clark  . NOSE SURGERY  06/23/2008   Dr. Carlis Abbott  . Stress Myoview  09/30/2004   Normal EF 70%  . Stress Myoview  02/11/2006   No change, c/w 09/2004  . Stress Myoview  02/22/2008   Abnormal w/prior inf infarct  No signif ischemia   Family History  Problem Relation Age of Onset  . Heart disease Father   . Pneumonia Mother   . Colon cancer Neg Hx   . Prostate cancer Neg Hx    Social History   Socioeconomic History  . Marital status: Married    Spouse name: Not on file  . Number of children: 2  . Years of education: Not on file  . Highest education level: Not on file  Occupational History  . Occupation: Retired Scientist, water quality: [employer]  . Occupation:  Part time    Employer: [employer]  Social Needs  . Financial resource strain: Not on file  . Food insecurity:    Worry: Not on file    Inability: Not on file  . Transportation needs:    Medical: Not on file    Non-medical: Not on file  Tobacco Use  . Smoking status: Never Smoker  . Smokeless tobacco: Never Used  Substance and Sexual Activity  . Alcohol use: Not Currently  . Drug use: No  . Sexual activity: Not Currently  Lifestyle  . Physical activity:    Days per week: Not on file    Minutes per session: Not on file  . Stress: Not on file  Relationships  . Social connections:    Talks on phone: Not on file    Gets together: Not  on file    Attends religious service: Not on file    Active member of club or organization: Not on file    Attends meetings of clubs or organizations: Not on file    Relationship status: Not on file  Other Topics Concern  . Not on file  Social History Narrative   Caffeine use:  1   Minimal exercise.   Working at [employer] part-time   Retired Arts administrator.  Married to [place] in [place] Encounter Medications as of 02/11/2019  Medication Sig  . aspirin 81 MG EC tablet Take 81 mg by mouth daily.   . Cholecalciferol (VITAMIN D3) 2000 units TABS Take by mouth daily.  . Coenzyme Q10 (CO Q-10) 100 MG CAPS Take 300 mg by mouth daily.  . Cyanocobalamin (VITAMIN B-12) 5000 MCG SUBL Place under the tongue daily.  Marland Kitchen lisinopril (PRINIVIL,ZESTRIL) 20 MG tablet Take 1 tablet (20 mg total) by mouth daily.  . metoprolol tartrate (LOPRESSOR) 25 MG tablet Take 0.5 tablets (12.5 mg total) by mouth 2 (two) times daily.  . Multiple Vitamin (MULTIVITAMIN) tablet Take 1 tablet by mouth daily.    . nitroGLYCERIN (NITROSTAT) 0.4 MG SL tablet DISSOLVE ONE TABLET UNDER TONGUE EVERY 5 MINUTES AS NEEDED FOR CHESTPAIN  . omeprazole (PRILOSEC) 20 MG capsule TAKE 1 CAPSULE BY MOUTH EVERY DAY. CAN TAPER TO EVERY OTHER DAY IF TOLERATED  . polyethylene glycol powder (GLYCOLAX/MIRALAX) powder Take 17 g by mouth daily as needed for mild constipation.  . rosuvastatin (CRESTOR) 20 MG tablet Take 1 tablet (20 mg total) by mouth daily.   No facility-administered encounter medications on file as of 02/11/2019.     Activities of Daily Living In your present state of health, do you have any difficulty performing the following activities: 02/11/2019  Hearing? Y  Vision? N  Difficulty concentrating or making decisions? N  Walking or climbing stairs? N  Dressing or bathing? N  Doing errands, shopping? N  Preparing Food and eating ? N  Using the Toilet? N  In the past six months, have you accidently leaked urine? N  Do  you have problems with loss of bowel control? N  Managing your Medications? N  Managing your Finances? N  Housekeeping or managing your Housekeeping? N  Some recent data might be hidden    Patient Care Team: Tonia Ghent, MD as PCP - General Rockey Situ Kathlene November, MD as Consulting Physician (Cardiology)   Assessment:   This is a routine wellness examination for [place].  Hearing Screening Comments: Bilateral hearing aids Vision Screening Comments: Vision exam in December 2019 at [facility]  Activities and Dietary recommendations Current Exercise Habits: Home exercise routine, Type of exercise: walking, Time (Minutes): 60, Frequency (Times/Week): 7, Weekly Exercise (Minutes/Week): 420, Intensity: Mild, Exercise limited by: None identified  Goals    . Increase physical activity     Starting 02/11/2019, I will continue to walk at least 60 minutes daily.        Fall Risk Fall Risk  02/11/2019 01/21/2018 12/09/2016 12/02/2016 10/31/2014  Falls in the past year? 0 No No No No   Depression Screen PHQ 2/9 Scores 02/11/2019 01/21/2018 12/09/2016 12/02/2016  PHQ - 2 Score 0 0 0 0  PHQ- 9 Score 0 0 - -    Cognitive Function MMSE - Mini Mental State Exam 02/11/2019 01/21/2018 12/02/2016  Orientation to time 5 5 5   Orientation to Place 5 5 5   Registration 3 3 3   Attention/ Calculation 0 0 0  Recall 3 3 3   Language- name 2 objects 0 0 0  Language- repeat 1 1 1   Language- follow 3 step command 0 3 3  Language- read & follow direction 0 0 0  Write a sentence 0 0 0  Copy design 0 0 0  Total score 17 20 20      PLEASE NOTE: A Mini-Cog screen was completed. Maximum score is 17. A value of 0 denotes this part of Folstein MMSE was not completed or the patient failed this part of the Mini-Cog screening.   Mini-Cog Screening Orientation to Time - Max 5 pts Orientation to Place - Max 5 pts Registration - Max 3 pts Recall - Max 3 pts Language Repeat - Max 1 pts      Immunization  History  Administered Date(s) Administered  . Influenza Whole 05/09/2010  . Influenza, High Dose Seasonal PF 05/28/2014, 06/01/2017  . Influenza-Unspecified 07/14/2015, 06/06/2016, 04/30/2018  . Pneumococcal Conjugate-13 09/08/2014  . Pneumococcal Polysaccharide-23 07/07/2007, 07/22/2017  . Td 06/27/2004  . Tdap 11/02/2016  . Zoster 07/14/2007  . Zoster Recombinat (Shingrix) 05/23/2018, 08/12/2018   Screening Tests Health Maintenance  Topic Date Due  . INFLUENZA VACCINE  04/09/2019  . TETANUS/TDAP  11/02/2026  . PNA vac Low Risk Adult  Completed       Plan:     I have personally reviewed, addressed, and noted the following in the patient's chart:  A. Medical and social history B. Use of alcohol, tobacco or illicit drugs  C. Current medications and supplements D. Functional ability and status E.  Nutritional status F.  Physical activity G. Advance directives H. List of other physicians I.  Hospitalizations, surgeries, and ER visits in previous 12 months J.  Vitals (unless it is a telemedicine encounter) K. Screenings to include cognitive, depression, hearing, vision (NOTE: hearing and vision screenings not completed in telemedicine encounter) L. Referrals and appointments   In addition, I have reviewed and discussed with patient certain preventive protocols, quality metrics, and best practice recommendations. A written personalized care plan for preventive services and recommendations were provided to patient.  With patient's permission, we connected on 02/11/19 at  8:30 AM EDT by a video enabled telemedicine application. Two patient identifiers were used to ensure the encounter occurred with the correct person.    Patient was in home and writer was in office.   Signed,   Lindell Noe, MHA, BS, LPN Health Coach

## 2019-02-14 ENCOUNTER — Other Ambulatory Visit (INDEPENDENT_AMBULATORY_CARE_PROVIDER_SITE_OTHER): Payer: Medicare Other

## 2019-02-14 ENCOUNTER — Other Ambulatory Visit: Payer: Self-pay | Admitting: Family Medicine

## 2019-02-14 DIAGNOSIS — I10 Essential (primary) hypertension: Secondary | ICD-10-CM

## 2019-02-14 LAB — COMPREHENSIVE METABOLIC PANEL
ALT: 14 U/L (ref 0–53)
AST: 16 U/L (ref 0–37)
Albumin: 3.9 g/dL (ref 3.5–5.2)
Alkaline Phosphatase: 48 U/L (ref 39–117)
BUN: 14 mg/dL (ref 6–23)
CO2: 26 mEq/L (ref 19–32)
Calcium: 8.8 mg/dL (ref 8.4–10.5)
Chloride: 103 mEq/L (ref 96–112)
Creatinine, Ser: 0.65 mg/dL (ref 0.40–1.50)
GFR: 117.41 mL/min (ref >60.00)
Glucose, Bld: 99 mg/dL (ref 70–99)
Potassium: 4.2 mEq/L (ref 3.5–5.1)
Sodium: 135 mEq/L (ref 135–145)
Total Bilirubin: 0.8 mg/dL (ref 0.2–1.2)
Total Protein: 5.9 g/dL — ABNORMAL LOW (ref 6.0–8.3)

## 2019-02-14 LAB — LIPID PANEL
Cholesterol: 115 mg/dL (ref 0–200)
HDL: 38.3 mg/dL — ABNORMAL LOW (ref >39.00)
LDL Cholesterol: 66 mg/dL (ref 0–99)
NonHDL: 77.19
Total CHOL/HDL Ratio: 3
Triglycerides: 56 mg/dL (ref 0.0–149.0)
VLDL: 11.2 mg/dL (ref 0.0–40.0)

## 2019-02-15 ENCOUNTER — Encounter: Payer: Self-pay | Admitting: Family Medicine

## 2019-02-15 ENCOUNTER — Ambulatory Visit (INDEPENDENT_AMBULATORY_CARE_PROVIDER_SITE_OTHER): Payer: Medicare Other | Admitting: Family Medicine

## 2019-02-15 VITALS — BP 126/72 | HR 60 | Ht 65.25 in | Wt 168.0 lb

## 2019-02-15 DIAGNOSIS — I10 Essential (primary) hypertension: Secondary | ICD-10-CM | POA: Diagnosis not present

## 2019-02-15 DIAGNOSIS — Z0000 Encounter for general adult medical examination without abnormal findings: Secondary | ICD-10-CM | POA: Diagnosis not present

## 2019-02-15 DIAGNOSIS — E7800 Pure hypercholesterolemia, unspecified: Secondary | ICD-10-CM | POA: Diagnosis not present

## 2019-02-15 DIAGNOSIS — K2270 Barrett's esophagus without dysplasia: Secondary | ICD-10-CM | POA: Diagnosis not present

## 2019-02-15 DIAGNOSIS — Z7189 Other specified counseling: Secondary | ICD-10-CM

## 2019-02-15 DIAGNOSIS — I25118 Atherosclerotic heart disease of native coronary artery with other forms of angina pectoris: Secondary | ICD-10-CM

## 2019-02-15 MED ORDER — ROSUVASTATIN CALCIUM 20 MG PO TABS: 20.0000 mg | ORAL_TABLET | Freq: Every day | ORAL | 3 refills | Status: DC

## 2019-02-15 MED ORDER — LISINOPRIL 20 MG PO TABS: 20.0000 mg | ORAL_TABLET | Freq: Every day | ORAL | 3 refills | Status: DC

## 2019-02-15 MED ORDER — METOPROLOL TARTRATE 25 MG PO TABS: 12.5000 mg | ORAL_TABLET | Freq: Two times a day (BID) | ORAL | 3 refills | Status: DC

## 2019-02-15 MED ORDER — OMEPRAZOLE 20 MG PO CPDR: DELAYED_RELEASE_CAPSULE | ORAL | 3 refills | Status: DC

## 2019-02-15 NOTE — Progress Notes (Signed)
Interactive audio and video telecommunications were attempted between this provider and patient, however failed, due to patient having technical difficulties OR patient did not have access to video capability.  We continued and completed visit with audio only.   Virtual Visit via Telephone Note  I connected with patient on 02/15/19 at 8:24 AM by telephone and verified that I am speaking with the correct person using two identifiers.  Location of patient: home  Location of MD: [place] Name of referring provider (if blank then none associated): Names per persons and role in encounter:  MD: Earlyne Iba, Patient: name listed above.    I discussed the limitations, risks, security and privacy concerns of performing an evaluation and management service by telephone and the availability of in person appointments. I also discussed with the patient that there may be a patient responsible charge related to this service. The patient expressed understanding and agreed to proceed.   CC: f/u  HPI:  Pandemic considerations d/w pt.    Advance directive- wife designated if patient were incapacitated. Defer colonoscopy and PSA given his age.  D/w pt.  He agrees.  shingrix prev done.  tdap 2018 Flu d/w pt PNA up to date.   CAD.  Hypertension:    Using medication without problems or lightheadedness: See below. Chest pain with exertion:no Edema:no Short of breath:no He has occ cough with ACE use.  This is episodic but not bothersome enough to change med.  He'll update me as needed.  No sputum, no hemoptysis.  Cough isn't worse and is longstanding intermittently.   BP was 126/72. 98% RA p60 T 97.6.    GERD controlled. Still on PPI 2-3 times per week, no ADE on med.  He has done better with diet improvement.  He is improved from that standpoint.  D/w pt about continued PPI given h/o Barrett's.    Elevated Cholesterol: Using medications without problems:yes Muscle aches: no Diet  compliance: yes Exercise:yes Labs d/w pt.   Past medical history, social history, family history discussed with patient.   Observations/Objective: nad Speech wnl.    Assessment and Plan:  Advance directive- wife designated if patient were incapacitated. Defer colonoscopy and PSA given his age.  D/w pt.  He agrees.  shingrix prev done.  tdap 2018 Flu d/w pt PNA up to date.   CAD.  Hypertension:    He has occ cough with ACE use.  This is episodic but not bothersome enough to change med.  He'll update me as needed.  No sputum, no hemoptysis.  Cough isn't worse and is longstanding intermittently.   BP was 126/72. 98% RA p60 T 97.6.    GERD controlled. Still on PPI 2-3 times per week, no ADE on med.  He has done better with diet improvement.  He is improved from that standpoint.  D/w pt about continued PPI given h/o Barrett's.    Elevated Cholesterol: no change in meds  Labs d/w pt.   Follow Up Instructions: see above.     I discussed the assessment and treatment plan with the patient. The patient was provided an opportunity to ask questions and all were answered. The patient agreed with the plan and demonstrated an understanding of the instructions.   The patient was advised to call back or seek an in-person evaluation if the symptoms worsen or if the condition fails to improve as anticipated.  I provided 25 minutes of non-face-to-face time during this encounter.  Elsie Stain, MD

## 2019-02-17 NOTE — Assessment & Plan Note (Signed)
Advance directive- wife designated if patient were incapacitated. Defer colonoscopy and PSA given his age.  D/w pt.  He agrees.  shingrix prev done.  tdap 2018 Flu d/w pt PNA up to date.

## 2019-02-17 NOTE — Assessment & Plan Note (Signed)
No change in meds at this point He has occ cough with ACE use.  This is episodic but not bothersome enough to change med.  He'll update me as needed.  No sputum, no hemoptysis.  Cough isn't worse and is longstanding intermittently.

## 2019-02-17 NOTE — Assessment & Plan Note (Signed)
GERD controlled. Still on PPI 2-3 times per week, no ADE on med.  He has done better with diet improvement.  He is improved from that standpoint.  D/w pt about continued PPI given h/o Barrett's.

## 2019-02-17 NOTE — Assessment & Plan Note (Signed)
Advance directive- wife designated if patient were incapacitated.  

## 2019-02-17 NOTE — Assessment & Plan Note (Signed)
no change in meds  Labs d/w pt.

## 2019-05-24 DIAGNOSIS — Z23 Encounter for immunization: Secondary | ICD-10-CM | POA: Diagnosis not present

## 2019-06-02 ENCOUNTER — Ambulatory Visit (INDEPENDENT_AMBULATORY_CARE_PROVIDER_SITE_OTHER): Payer: Medicare Other | Admitting: Family Medicine

## 2019-06-02 ENCOUNTER — Other Ambulatory Visit: Payer: Self-pay

## 2019-06-02 ENCOUNTER — Telehealth: Payer: Self-pay

## 2019-06-02 DIAGNOSIS — J069 Acute upper respiratory infection, unspecified: Secondary | ICD-10-CM

## 2019-06-02 DIAGNOSIS — Z20822 Contact with and (suspected) exposure to covid-19: Secondary | ICD-10-CM

## 2019-06-02 DIAGNOSIS — R6889 Other general symptoms and signs: Secondary | ICD-10-CM | POA: Diagnosis not present

## 2019-06-02 NOTE — Progress Notes (Signed)
Interactive audio and video telecommunications were attempted between this provider and patient, however failed, due to patient having technical difficulties OR patient did not have access to video capability.  We continued and completed visit with audio only.   Virtual Visit via Telephone Note  I connected with patient on 06/02/19  at 8:16 AM  by telephone and verified that I am speaking with the correct person using two identifiers.  Location of patient: home  Location of MD: [place] Name of referring provider (if blank then none associated): Names per persons and role in encounter:  MD: Earlyne Iba, Patient: name listed above.    I discussed the limitations, risks, security and privacy concerns of performing an evaluation and management service by telephone and the availability of in person appointments. I also discussed with the patient that there may be a patient responsible charge related to this service. The patient expressed understanding and agreed to proceed.  CC: flu like sx.    History of Present Illness: flu shot done 05/24/2019 and felt well at that point.  Then two days later he had sx.  Started with some leg aches but that resolved.  Then he had some back aches.  Then some chills.  tmax 100 two days ago.  Down to temp 96-98 recently.  97.8 temp this AM.  He has some sinus pressure recently.  No vomiting.  Some diarrhea this AM, once.  Minimal cough over the last few days.  minimal sputum in the last few days.  Not SOB.  No loss of taste or smell.  He feels better overall today.    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    95%, p71, 127/58  Observations/Objective: nad ncat  Assessment and Plan: URI sx but with possible exposure at work and needs covid testing.  Nontoxic and okay for outpatient f/u.  D/w pt, will ask for outpatient drive up testing to be done.  Work note done.  No change in meds at this point since he  feels some better w/o emergent or urgent sx.  Supportive care for now.  He agrees.  He'll update me as needed.  I asked him to stay home (other than going to drive up testing) until his covid test is resulted.    Also discussed that some sx could have been from immune response to flu vaccine.  Either way, still would need testing for covid.   Follow Up Instructions: see above.    I discussed the assessment and treatment plan with the patient. The patient was provided an opportunity to ask questions and all were answered. The patient agreed with the plan and demonstrated an understanding of the instructions.   The patient was advised to call back or seek an in-person evaluation if the symptoms worsen or if the condition fails to improve as anticipated.  I provided 15 minutes of non-face-to-face time during this encounter.  Elsie Stain, MD

## 2019-06-02 NOTE — Assessment & Plan Note (Signed)
URI sx but with possible exposure at work and needs covid testing.  Nontoxic and okay for outpatient f/u.  D/w pt, will ask for outpatient drive up testing to be done.  Work note done.  No change in meds at this point since he feels some better w/o emergent or urgent sx.  Supportive care for now.  He agrees.  He'll update me as needed.  I asked him to stay home (other than going to drive up testing) until his covid test is resulted.    Also discussed that some sx could have been from immune response to flu vaccine.  Either way, still would need testing for covid.

## 2019-06-02 NOTE — Telephone Encounter (Signed)
[place] Night - Client Nonclinical Telephone Record AccessNurse Client [place] Night - Client Client Site [facility] Physician Renford Dills - MD Contact Type Call Who Is Calling Patient / Member / Family / Caregiver Caller Name [name] Caller Phone Number [phone] Patient Name Barry Small Patient DOB [birth year] Call Type Message Only Information Provided Reason for Call Request for General Office Information Initial Comment Caller states they made an appointment for a virtual visit in the morning. Would like to confirm appt is virtual and not at office. Additional Comment Call Closed By: Arnaldo Natal Transaction Date/Time: 06/01/2019 5:00:43 PM (ET)

## 2019-06-02 NOTE — Telephone Encounter (Signed)
Dr Damita Dunnings has already done visit over phone.

## 2019-06-03 ENCOUNTER — Encounter: Payer: Self-pay | Admitting: Family Medicine

## 2019-06-03 ENCOUNTER — Telehealth: Payer: Self-pay

## 2019-06-03 LAB — NOVEL CORONAVIRUS, NAA: SARS-CoV-2, NAA: DETECTED — AB

## 2019-06-03 NOTE — Telephone Encounter (Signed)
Gayle advised

## 2019-06-03 NOTE — Telephone Encounter (Signed)
A lady left v/m that cannot understand results for covid testing; does pt have covid? request cb.

## 2019-06-03 NOTE — Telephone Encounter (Signed)
Have him quarantine for 10 days since last fever, which is likely going to be 7 days from now. This is assuming he continues to feel well.   Have household contacts quarantine for 10 days from now assuming all are feeling well.  Asymptomatic contacts need to quarantine longer due to the incubation time.  If anyone is worse in the meantime, then call back to check with [place].   Supportive care in the meantime, rest, fluids, etc.  Thanks.

## 2019-06-03 NOTE — Telephone Encounter (Signed)
Abdulrehman Inglett advised who says patient lost hearing aide and can't hear on the phone.  Edd Fabian asks if there are any instructions other than quarantine.  Patient is doing fine.

## 2019-06-08 ENCOUNTER — Telehealth: Payer: Self-pay | Admitting: *Deleted

## 2019-06-08 NOTE — Telephone Encounter (Signed)
Called to follow-up with patient because of a positive covid result.  Patient stated that hie is doing a lot better. Patient stated that he does not have a fever and cough is much better. Patient stated that someone from the [facility] has been calling him everyday. ER precautions were given if he develops high fever, difficulty breathing or SOB and he verbalized understanding.

## 2019-06-08 NOTE — Telephone Encounter (Signed)
Noted.  Thanks.  Glad he is doing better.

## 2019-06-10 NOTE — Telephone Encounter (Signed)
Called today again to follow up on patient. Left message to call back

## 2019-06-13 ENCOUNTER — Telehealth: Payer: Self-pay | Admitting: Family Medicine

## 2019-06-13 NOTE — Telephone Encounter (Signed)
Please triage patient and let me know if we have the ability to check patient here for strep given recent covid dx.  Thanks.

## 2019-06-13 NOTE — Telephone Encounter (Signed)
Madaline Savage (wife) called she spoke to [place] the nurse @ health dept.  Pt is still having problems with sore throat.  No cough.  She recommend pt be test for strep  Please advise  Best number [phone]

## 2019-06-13 NOTE — Telephone Encounter (Signed)
Pt said has a lot of drainage at night time and when gets up has S/T on rt side of throat; pt said he feels great but sounds SOB but pt said he was out chasing his dog. Pt got a letter last day for quarantine is 06/14/19.pt said he does not have a cough today and no other covid symptoms today except S/T. I spoke with Leafy Ro RN and she said starting to run low on PPE but if Dr Damita Dunnings wants pt to come to office and sit in car for Dr Damita Dunnings to swab throat we can do that but suggest pt go to [facility] . Pt said he does not want to go to [facility] and will wait a couple of days to see how throat feels since his quarantine is supposed to end 06/14/19 I thought quarantine would go thru 06/16/19 but pt said no.pt said would call back if needed. FYI to Dr Damita Dunnings.

## 2019-06-14 NOTE — Telephone Encounter (Signed)
I think it makes sense to give this a few more days to see if his throat improves in the meantime.  If he has quarantined in the meantime, then it would be unlikely for him to have been exposed to strep throat.  It is reasonable for his quarantine to end today, based on the timing of his test results and symptom onset.  Thanks.

## 2019-06-14 NOTE — Telephone Encounter (Signed)
I spoke with pt and notified as instructed and pt voiced understanding. Pt said his throat feels better this morning but in a few days if the throat is not completely normal pt will call [place].

## 2019-06-14 NOTE — Telephone Encounter (Signed)
Noted. Thanks.

## 2019-06-15 NOTE — Telephone Encounter (Signed)
Patient's wife states patient is still struggling with sore throat. It is an achy feeling. He has used chloraseptic spray and has been gargling with salt and water. Symptom are not worse from when he spoke with [facility] but staying the same. No fever. Wondering what other suggestions Dr Damita Dunnings had or something he could call in to help? Advised Dr Damita Dunnings is not here and it may tomorrow when we can call back and Mrs Derderian said that was fine.

## 2019-06-15 NOTE — Telephone Encounter (Signed)
See telephone note on 06/13/2019. Patient's quarantine ended on 06/14/2019 and dr Damita Dunnings aware.

## 2019-06-15 NOTE — Telephone Encounter (Signed)
I suspect this is just residual irritation and it should gradually get better.  I would give it a little more time and continue gargling with salt water.  Now that he is out of quarantine we can see him at the clinic if needed.  It still might make sense to have him set up for a visit in the parking lot.  Let me know if he is getting worse in the meantime.  Thanks.

## 2019-06-17 NOTE — Telephone Encounter (Signed)
I spoke with patient's wife & she stated that he is still dealing with sore throat. I advised on salt water gargles & if things don't improve to let us know. She stated that they would & schedule parking lot appointment.

## 2019-06-23 ENCOUNTER — Other Ambulatory Visit: Payer: Self-pay

## 2019-06-23 ENCOUNTER — Ambulatory Visit (INDEPENDENT_AMBULATORY_CARE_PROVIDER_SITE_OTHER): Payer: Medicare Other | Admitting: Family Medicine

## 2019-06-23 ENCOUNTER — Encounter: Payer: Self-pay | Admitting: Family Medicine

## 2019-06-23 DIAGNOSIS — I25118 Atherosclerotic heart disease of native coronary artery with other forms of angina pectoris: Secondary | ICD-10-CM

## 2019-06-23 DIAGNOSIS — J069 Acute upper respiratory infection, unspecified: Secondary | ICD-10-CM

## 2019-06-26 ENCOUNTER — Encounter: Payer: Self-pay | Admitting: Family Medicine

## 2019-06-26 NOTE — Progress Notes (Signed)
Follow-up.  Previously tested positive for COVID.  He is well in excess of his time needed for quarantine.  He says "I am 98% better".  He was here today originally to follow-up about a sore throat.  That is clearly better in the meantime.  No fevers chills nausea or vomiting.  No aches now.  He felt well enough to mow the yard today.  No known sick exposures recently and no known exposures to strep throat.  We saw the patient in the parking lot just because of his chief complaint of sore throat.  He agreed to this.  Meds, vitals, and allergies reviewed.   ROS: Per HPI unless specifically indicated in ROS section   GEN: nad, alert and oriented HEENT: mucous membranes moist, OP wnl, no erythema.  No exudates NECK: supple w/o LA CV: rrr. PULM: ctab, no inc wob ABD: soft, +bs SKIN: Well-perfused.

## 2019-06-26 NOTE — Assessment & Plan Note (Signed)
Clearly better in the meantime.  Benign exam.  He feels well.  He felt well enough to cut his grass today.  No need for strep test at this point given his exam.  He will update me as needed.  He is out of his quarantine window for COVID.  Discussed with patient.  He agrees.  He will update me as needed.

## 2019-08-09 DIAGNOSIS — H6121 Impacted cerumen, right ear: Secondary | ICD-10-CM | POA: Diagnosis not present

## 2019-08-09 DIAGNOSIS — H903 Sensorineural hearing loss, bilateral: Secondary | ICD-10-CM | POA: Diagnosis not present

## 2019-09-12 ENCOUNTER — Telehealth: Payer: Self-pay | Admitting: Family Medicine

## 2019-09-12 NOTE — Telephone Encounter (Signed)
Appointment scheduled and patient advised to bring in some BP readings.

## 2019-09-12 NOTE — Telephone Encounter (Signed)
Patient's Wife Called  She stated that the patient is wanting to come see you to discuss the patient's BP. She was not with the patient so she was unable to give any BP readings.   Wife stated that the patient has a lingering sore throat. And was previously diagnosed in sept with covid, Patient had to be tested today in order for him to return to work at [employer].   Would you like this patient to come in?  Patient's wife stated that he can not do a virtual visit

## 2019-09-12 NOTE — Telephone Encounter (Signed)
If he doesn't have new sx and only has chronic sx, then old covid dx shouldn't be an issue/prevent an in person visit. Please schedule.  See if he can bring BP readings.  Thanks.

## 2019-09-12 NOTE — Telephone Encounter (Signed)
Reached back out to patient's wife. Had to leave a voicemail. I was going to see if she has anyway that she could get BP readings from the patient so we know what his blood pressure if running

## 2019-09-13 ENCOUNTER — Ambulatory Visit (INDEPENDENT_AMBULATORY_CARE_PROVIDER_SITE_OTHER): Payer: Medicare Other | Admitting: Family Medicine

## 2019-09-13 ENCOUNTER — Ambulatory Visit: Payer: Medicare Other | Admitting: Family Medicine

## 2019-09-13 ENCOUNTER — Encounter: Payer: Self-pay | Admitting: Family Medicine

## 2019-09-13 ENCOUNTER — Other Ambulatory Visit: Payer: Self-pay

## 2019-09-13 DIAGNOSIS — I10 Essential (primary) hypertension: Secondary | ICD-10-CM

## 2019-09-13 NOTE — Patient Instructions (Signed)
Your pressure is fine.  I wouldn't change your meds for now.  If you have troubles otherwise, then let me know.  I would cut back on onion/peppers/greasy foods in the meantime.  Take care.  Glad to see you.

## 2019-09-13 NOTE — Progress Notes (Signed)
This visit occurred during the SARS-CoV-2 public health emergency.  Safety protocols were in place, including screening questions prior to the visit, additional usage of staff PPE, and extensive cleaning of exam room while observing appropriate contact time as indicated for disinfecting solutions.  Hypertension:    Using medication without problems or lightheadedness: yes Chest pain with exertion:no Edema:no Short of breath:no Average home BPs: 130-140/60-70, occ higher systolics.   Usually with pulse in the 60s on home checks.  He tends to have lower BP on repeat checks.   He has some upper L abd discomfort that improves with burping but it clearly isn't exertional.  Discussed diet modification to see if that helps with sx.   He still has his CDLs with renewal pending.   We talked about stressors with covid, etc.    Meds, vitals, and allergies reviewed.  ROS: Per HPI unless specifically indicated in ROS section   GEN: nad, alert and oriented HEENT: ncat NECK: supple w/o LA CV: rrr PULM: ctab, no inc wob ABD: soft, +bs EXT: no edema SKIN: well perfused.

## 2019-09-13 NOTE — Assessment & Plan Note (Addendum)
Reasonable control w/o alarming sx.   He has some upper L abd discomfort that improves with burping but it clearly isn't exertional.  Discussed diet modification to see if that helps with sx.  He can update me as needed.  Recheck BP improved.  He tends to have lower BP on repeat checks.   No change in meds at this point.  He'll update me as needed.  See avs.  He agrees with plan.

## 2019-09-16 NOTE — Progress Notes (Signed)
Cardiology Office Note  Date:  09/19/2019   ID:  Barry Small, DOB 1934-09-02, MRN [id number]  PCP:  Tonia Ghent, MD   Chief Complaint  Patient presents with  . other    BP Issues and stress. Meds reviewed verbally with pt.    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 Resting EKG with old inferior MI  Other past medical history Previous pneumonia in February 2014  His last stress nuclear study was in May 2017. EF was normal with an old inferior wall infarct scar. There was no ischemia.   PMH:   has a past medical history of Barrett's esophagus, Colon polyps (04/2003), Coronary artery disease, Elevated bilirubin, Elevated glucose, GERD (gastroesophageal reflux disease), Hyperlipidemia, Hypertension, Myocardial infarction ([place]), and Pneumonia (2014).  PSH:    Past Surgical History:  Procedure Laterality Date  . [place]  08/2018  . Cardiolite  12/2001   h/o vessel infarct EF 66%  . CATARACT EXTRACTION W/ INTRAOCULAR  LENS  IMPLANT, BILATERAL  09/2018  . CHOLECYSTECTOMY  09/2001  . COLONOSCOPY  11/15/2008   Mild divertics, 5 yrs, Dr. Fuller Plan  . COLONOSCOPY W/ POLYPECTOMY  05/09/2003   x multiple, Dr. Vira Agar  . CORONARY ANGIOPLASTY  2001   x 2, Dr.Pulsipher  . CORONARY ANGIOPLASTY WITH STENT PLACEMENT  12/05/2005  . CORONARY ARTERY BYPASS GRAFT  1989   [facility]  . ESOPHAGOGASTRODUODENOSCOPY  05/09/2003   Barrett's  . ESOPHAGOGASTRODUODENOSCOPY  11/17/08   Barrett's Esoph Erosive Esoph HH (Dr. Fuller Plan)  2 years  . EYE SURGERY  09/2018   cataract extraction  . [facility]  03/28-03/31/2007   Chest pain  . NASAL SINUS SURGERY  06/23/2008   Dr.Clark  . NOSE SURGERY  06/23/2008   Dr. Carlis Abbott  . Stress Myoview  09/30/2004   Normal EF 70%  . Stress Myoview  02/11/2006   No change, c/w 09/2004  . Stress Myoview  02/22/2008   Abnormal w/prior inf infarct  No signif ischemia    Current Outpatient Medications  Medication Sig Dispense Refill  . aspirin 81 MG EC tablet Take 81 mg by mouth daily.     . Cholecalciferol (VITAMIN D3) 2000 units TABS Take by mouth daily.    . Coenzyme Q10 (CO Q-10) 100 MG CAPS Take 300 mg by mouth daily.    . Cyanocobalamin (VITAMIN B-12) 5000 MCG SUBL Place under the tongue daily.    . fluticasone (FLONASE) 50 MCG/ACT nasal spray Place into both nostrils at bedtime as needed for allergies  or rhinitis.    Marland Kitchen lisinopril (ZESTRIL) 20 MG tablet Take 1 tablet (20 mg total) by mouth daily. 90 tablet 3  . metoprolol tartrate (LOPRESSOR) 25 MG tablet Take 0.5 tablets (12.5 mg total) by mouth 2 (two) times daily. 90 tablet 3  . Multiple Vitamin (MULTIVITAMIN) tablet Take 1 tablet by mouth daily.      . Multiple Vitamins-Minerals (ZINC PO) Take 1,000 mg by mouth daily.    . nitroGLYCERIN (NITROSTAT) 0.4 MG SL tablet DISSOLVE ONE TABLET UNDER TONGUE EVERY 5 MINUTES AS NEEDED FOR CHESTPAIN 25 tablet 12  . omeprazole (PRILOSEC) 20 MG capsule TAKE 1 CAPSULE BY MOUTH EVERY DAY 90 capsule 3  . Polyethyl  Glycol-Propyl Glycol (SYSTANE OP) Apply to eye. As needed    . polyethylene glycol powder (GLYCOLAX/MIRALAX) powder Take 17 g by mouth daily as needed for mild constipation.    . rosuvastatin (CRESTOR) 20 MG tablet Take 1 tablet (20 mg total) by mouth daily. (Patient taking differently: Take 10 mg by mouth daily. ) 90 tablet 3   No current facility-administered medications for this visit.     Allergies:   Atorvastatin, Morphine, and Simvastatin   Social History:  The patient  reports that he has never smoked. He has never used smokeless tobacco. He reports previous alcohol use. He reports that he does not use drugs.   Family History:   family history includes Heart disease in his father; Pneumonia in his mother.    Review of Systems: Review of Systems  Constitutional: Negative.   Respiratory: Negative.   Cardiovascular: Negative.   Gastrointestinal: Negative.   Musculoskeletal: Negative.   Neurological: Negative.   Psychiatric/Behavioral: Negative.   All other systems reviewed and are negative.   PHYSICAL EXAM: VS:  BP (!) 146/58 (BP Location: Left Arm, Patient Position: Sitting, Cuff Size: Normal)   Pulse 62   Ht 5\' 7"  (1.702 m)   Wt 170 lb 12 oz (77.5 kg)   SpO2 98%   BMI 26.74 kg/m  , BMI Body mass index is 26.74 kg/m. Constitutional:  oriented to person, place, and time. No distress.  HENT:  Head: Grossly normal Eyes:  no discharge. No scleral icterus.  Neck: No JVD, no carotid bruits  Cardiovascular: Regular rate and rhythm, no murmurs appreciated Pulmonary/Chest: Clear to auscultation bilaterally, no wheezes or rails Abdominal: Soft.  no distension.  no tenderness.  Musculoskeletal: Normal range of motion Neurological:  normal muscle tone. Coordination normal. No atrophy Skin: Skin warm and dry Psychiatric: normal affect, pleasant  Recent Labs: 02/14/2019: ALT 14; BUN 14; Creatinine, Ser 0.65; Potassium 4.2; Sodium 135    Lipid Panel Lab Results  Component  Value Date   CHOL 115 02/14/2019   HDL 38.30 (L) 02/14/2019   LDLCALC 66 02/14/2019   TRIG 56.0 02/14/2019    Wt Readings from Last 3 Encounters:  09/19/19 170 lb 12 oz (77.5 kg)  09/13/19 170 lb 7 oz (77.3 kg)  02/15/19 168 lb (76.2 kg)     ASSESSMENT AND PLAN:  Essential hypertension - Plan: EKG 12-Lead Blood pressure elevated on today's visit Controlled at home, no changes made  Atherosclerosis of native coronary artery without angina pectoris, unspecified whether native or transplanted heart - Old inferior MI Previous stress Myoview test May 2017 showing no ischemia Repeat stress test March 2019 again with no significant ischemia Tolerating Crestor, aspirin, beta-blocker, ACE inhibitor No change in clinical status, denies chest pain concerning for angina We will order a treadmill study for  DOT  HYPERCHOLESTEROLEMIA On Crestor 10 mg daily, cut a 20 in in 1/2 daily Goal LDL <70  CDL renewal ETT scheduled for DOT physical   Total encounter time more than 25 minutes  Greater than 50% was spent in counseling and coordination of care with the patient   Disposition:   F/U  12 months   Orders Placed This Encounter  Procedures  . EKG 12-Lead     Signed, Esmond Plants, M.D., Ph.D. 09/19/2019  [place], [place]

## 2019-09-19 ENCOUNTER — Ambulatory Visit (INDEPENDENT_AMBULATORY_CARE_PROVIDER_SITE_OTHER): Payer: Medicare Other | Admitting: Cardiovascular Disease

## 2019-09-19 ENCOUNTER — Other Ambulatory Visit: Payer: Self-pay

## 2019-09-19 ENCOUNTER — Encounter: Payer: Self-pay | Admitting: Cardiovascular Disease

## 2019-09-19 VITALS — BP 146/58 | HR 62 | Ht 67.0 in | Wt 170.8 lb

## 2019-09-19 DIAGNOSIS — Z951 Presence of aortocoronary bypass graft: Secondary | ICD-10-CM | POA: Diagnosis not present

## 2019-09-19 DIAGNOSIS — I25118 Atherosclerotic heart disease of native coronary artery with other forms of angina pectoris: Secondary | ICD-10-CM | POA: Diagnosis not present

## 2019-09-19 DIAGNOSIS — E7800 Pure hypercholesterolemia, unspecified: Secondary | ICD-10-CM | POA: Diagnosis not present

## 2019-09-19 DIAGNOSIS — I10 Essential (primary) hypertension: Secondary | ICD-10-CM | POA: Diagnosis not present

## 2019-09-19 NOTE — Patient Instructions (Addendum)
Routine treadmill for DOT physical  Medication Instructions:  No changes  If you need a refill on your cardiac medications before your next appointment, please call your pharmacy.    Lab work: No new labs needed   If you have labs (blood work) drawn today and your tests are completely normal, you will receive your results only by: Marland Kitchen MyChart Message (if you have MyChart) OR . A paper copy in the mail If you have any lab test that is abnormal or we need to change your treatment, we will call you to review the results.   Testing/Procedures: Exercise Stress Test An exercise stress test is a test to check how your heart works during exercise and checks for coronary artery disease. Your heart rate will be watched while you are resting and while you are exercising. Patches (electrodes) will be put on your chest.   Do not put lotions, powders, creams, or oils on your chest before the test. Wires will be connected to the patches. The wires will send signals to a machine to record your heartbeat. Wear comfortable shoes and clothing.   Follow instructions from your doctor about what you cannot eat or drink before the test. Before the procedure Follow instructions from your doctor about what you cannot eat or drink.   Do not have any drinks or foods that have caffeine in them for 24 hours before the test, or as told by your doctor. This includes coffee, tea (even decaf tea), sodas, chocolate, and cocoa.   Hold Metoprolol 2 days prior to your stress test. If you use an inhaler, bring it with you to the test. Do not use any products that have nicotine or tobacco in them, such as cigarettes and e-cigarettes. Stop using them at least 4 hours before the test. If you need help quitting, ask your doctor.  You will need a COVID Swab the day before your stress test. We will call you with this information.   Follow-Up: At [facility], you and your health needs are our priority.  As part of our  continuing mission to provide you with exceptional heart care, we have created designated Provider Care Teams.  These Care Teams include your primary Cardiologist (physician) and Advanced Practice Providers (APPs -  Physician Assistants and Nurse Practitioners) who all work together to provide you with the care you need, when you need it.  . You will need a follow up appointment in 12 months   . Providers on your designated Care Team:   . Murray Hodgkins, NP . Christell Faith, PA-C . Marrianne Mood, PA-C  Any Other Special Instructions Will Be Listed Below (If Applicable).  For educational health videos Log in to : [email or web address] Or : [email or web address], password : [email or web address]

## 2019-10-04 ENCOUNTER — Other Ambulatory Visit: Payer: Self-pay

## 2019-10-04 ENCOUNTER — Other Ambulatory Visit
Admission: RE | Admit: 2019-10-04 | Discharge: 2019-10-04 | Disposition: A | Discharge status: Home / Self Care | Payer: Medicare Other | Source: Ambulatory Visit | Attending: Cardiovascular Disease | Admitting: Cardiovascular Disease

## 2019-10-04 DIAGNOSIS — Z01812 Encounter for preprocedural laboratory examination: Secondary | ICD-10-CM | POA: Diagnosis not present

## 2019-10-04 DIAGNOSIS — Z20822 Contact with and (suspected) exposure to covid-19: Secondary | ICD-10-CM | POA: Insufficient documentation

## 2019-10-05 LAB — SARS CORONAVIRUS 2 (TAT 6-24 HRS): SARS Coronavirus 2: NEGATIVE

## 2019-10-06 ENCOUNTER — Ambulatory Visit (INDEPENDENT_AMBULATORY_CARE_PROVIDER_SITE_OTHER): Payer: Medicare Other

## 2019-10-06 ENCOUNTER — Other Ambulatory Visit: Payer: Self-pay

## 2019-10-06 DIAGNOSIS — I25118 Atherosclerotic heart disease of native coronary artery with other forms of angina pectoris: Secondary | ICD-10-CM

## 2019-10-06 DIAGNOSIS — Z951 Presence of aortocoronary bypass graft: Secondary | ICD-10-CM | POA: Diagnosis not present

## 2019-10-10 ENCOUNTER — Telehealth: Payer: Self-pay | Admitting: Cardiovascular Disease

## 2019-10-10 ENCOUNTER — Ambulatory Visit: Payer: Medicare Other | Admitting: Cardiovascular Disease

## 2019-10-10 DIAGNOSIS — I25118 Atherosclerotic heart disease of native coronary artery with other forms of angina pectoris: Secondary | ICD-10-CM

## 2019-10-10 LAB — EXERCISE TOLERANCE TEST
Estimated workload: 6.2 METS
Exercise duration (min): 4 min
Exercise duration (sec): 24 s
MPHR: 137 {beats}/min
Peak HR: 190 {beats}/min
Percent HR: 138 %
Rest HR: 71 {beats}/min

## 2019-10-10 NOTE — Telephone Encounter (Signed)
Patient wife calling in post stress test. Wife wanting to know if the report is ready and if Dr. Rockey Situ has signed the CDL paperwork  Please advise wife when able

## 2019-10-10 NOTE — Telephone Encounter (Signed)
Spoke with patients wife per release form and advised that stress test is pending provider review and paperwork is also with that test. Advised that he should read it sometime this week. She verbalized understanding and states that he does have appointment coming up on Thursday and needs it by then if at all possible. Advised I would make provider aware and would call when information is ready to be picked up. She was appreciative for the call with no further questions at this time.

## 2019-10-11 NOTE — Telephone Encounter (Signed)
Spoke with patients wife per release form and reviewed that stress test was scheduled for tomorrow. Reviewed all instructions and let her know that I did send through [place] as well. She verbalized understanding, was appreciative for the call, and had no further questions at this time. Advised that once provider replied with medication changes I would give her a call back.

## 2019-10-11 NOTE — Telephone Encounter (Signed)
Spoke with patients wife per release form and reviewed that he did not pass his stress test that was done. Advised that Dr. Rockey Situ also wants to start some new medications for his blood pressure as well. Advised that he would require nuclear stress test at [facility] and that I would see if they have anything available tomorrow and would call back with further recommendations from Dr. Rockey Situ.

## 2019-10-12 ENCOUNTER — Ambulatory Visit
Admission: RE | Admit: 2019-10-12 | Discharge: 2019-10-12 | Disposition: A | Discharge status: Home / Self Care | Payer: Medicare Other | Source: Ambulatory Visit | Attending: Cardiovascular Disease | Admitting: Cardiovascular Disease

## 2019-10-12 ENCOUNTER — Other Ambulatory Visit: Payer: Self-pay

## 2019-10-12 DIAGNOSIS — I25118 Atherosclerotic heart disease of native coronary artery with other forms of angina pectoris: Secondary | ICD-10-CM | POA: Diagnosis not present

## 2019-10-12 LAB — NM MYOCAR MULTI W/SPECT W/WALL MOTION / EF
Estimated workload: 1 METS
Exercise duration (min): 0 min
Exercise duration (sec): 0 s
LV dias vol: 95 mL (ref 62–150)
LV sys vol: 30 mL
MPHR: 137 {beats}/min
Peak HR: 118 {beats}/min
Percent HR: 86 %
Rest HR: 75 {beats}/min
SDS: 1
SRS: 8
SSS: 7
TID: 1.02

## 2019-10-12 MED ORDER — REGADENOSON 0.4 MG/5ML IV SOLN
0.4000 mg | Freq: Once | INTRAVENOUS | Status: AC
  Administered 2019-10-12: 0.4 mg via INTRAVENOUS

## 2019-10-12 MED ORDER — TECHNETIUM TC 99M TETROFOSMIN IV KIT
10.0000 | PACK | Freq: Once | INTRAVENOUS | Status: AC | PRN
  Administered 2019-10-12: 11.064 via INTRAVENOUS

## 2019-10-12 MED ORDER — TECHNETIUM TC 99M TETROFOSMIN IV KIT
30.0800 | PACK | Freq: Once | INTRAVENOUS | Status: AC | PRN
  Administered 2019-10-12: 11:00:00 30.08 via INTRAVENOUS

## 2019-10-13 ENCOUNTER — Telehealth: Payer: Self-pay | Admitting: Cardiovascular Disease

## 2019-10-13 NOTE — Telephone Encounter (Signed)
Secure chat sent to Dr. Rockey Situ to address Lexiscan and DOT clearance.

## 2019-10-13 NOTE — Telephone Encounter (Signed)
Per Secure chat message received from Dr. Rockey Situ: stress test shows no ischemia. need him to start amlodipine 5 mg daily with call back to [place] with BP. he will get denied given high BP if he goes in for eval by DOT doc

## 2019-10-13 NOTE — Telephone Encounter (Signed)
Patient calling for lexi results and dot clearance status

## 2019-10-14 MED ORDER — AMLODIPINE BESYLATE 5 MG PO TABS: 5.0000 mg | ORAL_TABLET | Freq: Every day | ORAL | 3 refills | Status: DC

## 2019-10-14 NOTE — Telephone Encounter (Signed)
Spoke with patients wife per release form and reviewed provider recommendations to start amlodipine 5 mg once daily and then monitor blood pressures 2 hours after medications and keep a log of those readings. Instructed her to please call us next week with his readings so that we can review and determine if his medications are helping to control his numbers. Reviewed that blood pressures must be controlled in order for him to pass DOT requirements and adding this medication and monitoring his numbers will allow [place] to determine if he would pass. She verbalized understanding with no further questions at this time.

## 2019-10-20 NOTE — Telephone Encounter (Signed)
Spoke with patients wife per release form and she wanted to know how to proceed for getting his DOT renewal. Advised that I would send these BP readings and information to Dr. Rockey Situ and will be in touch with recommendations. She verbalized understanding with no further questions at this time.

## 2019-10-20 NOTE — Telephone Encounter (Signed)
Patient bp log   2/5  140/65  2/6 136/73  2/7 128/74  2/8 130/67   2/9 135/68  2/10 129/69  2/11 124/64  Patient wife would like to know what is the POC now and what to do about DOT

## 2019-10-22 NOTE — Telephone Encounter (Signed)
Blood pressure much better Okay to clear for DOT given low risk stress test and blood pressure stable

## 2019-10-24 NOTE — Telephone Encounter (Signed)
Spoke with patients wife per release form and reviewed that paperwork has been done and ready for him to pick up. She will let him know and have him come by the office to pick it up. She was appreciative for the call with no further questions at this time. Envelope with completed form on my desk for when he comes in to pick up.

## 2019-10-24 NOTE — Telephone Encounter (Signed)
Spoke with patients wife per release form and that provider is going to clear him for DOT. Let her know that I would give her a call when the forms are completed and she can then pick them up for his upcoming DOT appointment. She was appreciative for the call with no further questions at this time.

## 2020-01-03 ENCOUNTER — Other Ambulatory Visit: Payer: Self-pay | Admitting: Family Medicine

## 2020-02-24 ENCOUNTER — Other Ambulatory Visit: Payer: Self-pay

## 2020-02-24 ENCOUNTER — Ambulatory Visit: Payer: Medicare Other

## 2020-02-27 ENCOUNTER — Other Ambulatory Visit (INDEPENDENT_AMBULATORY_CARE_PROVIDER_SITE_OTHER): Payer: Medicare Other

## 2020-02-27 ENCOUNTER — Other Ambulatory Visit: Payer: Self-pay | Admitting: Family Medicine

## 2020-02-27 ENCOUNTER — Other Ambulatory Visit: Payer: Self-pay

## 2020-02-27 DIAGNOSIS — I10 Essential (primary) hypertension: Secondary | ICD-10-CM

## 2020-02-27 LAB — COMPREHENSIVE METABOLIC PANEL
ALT: 19 U/L (ref 0–53)
AST: 20 U/L (ref 0–37)
Albumin: 4.4 g/dL (ref 3.5–5.2)
Alkaline Phosphatase: 55 U/L (ref 39–117)
BUN: 13 mg/dL (ref 6–23)
CO2: 28 mEq/L (ref 19–32)
Calcium: 9.4 mg/dL (ref 8.4–10.5)
Chloride: 100 mEq/L (ref 96–112)
Creatinine, Ser: 0.73 mg/dL (ref 0.40–1.50)
GFR: 102.43 mL/min (ref >60.00)
Glucose, Bld: 104 mg/dL — ABNORMAL HIGH (ref 70–99)
Potassium: 4.7 mEq/L (ref 3.5–5.1)
Sodium: 132 mEq/L — ABNORMAL LOW (ref 135–145)
Total Bilirubin: 0.7 mg/dL (ref 0.2–1.2)
Total Protein: 6.7 g/dL (ref 6.0–8.3)

## 2020-02-27 LAB — LIPID PANEL
Cholesterol: 118 mg/dL (ref 0–200)
HDL: 43.7 mg/dL (ref >39.00)
LDL Cholesterol: 61 mg/dL (ref 0–99)
NonHDL: 74.62
Total CHOL/HDL Ratio: 3
Triglycerides: 70 mg/dL (ref 0.0–149.0)
VLDL: 14 mg/dL (ref 0.0–40.0)

## 2020-03-04 ENCOUNTER — Other Ambulatory Visit: Payer: Self-pay | Admitting: Family Medicine

## 2020-03-05 ENCOUNTER — Encounter: Payer: Self-pay | Admitting: Family Medicine

## 2020-03-05 ENCOUNTER — Other Ambulatory Visit: Payer: Self-pay

## 2020-03-05 ENCOUNTER — Ambulatory Visit (INDEPENDENT_AMBULATORY_CARE_PROVIDER_SITE_OTHER): Payer: Medicare Other | Admitting: Family Medicine

## 2020-03-05 VITALS — BP 138/60 | HR 64 | Temp 97.8°F | Ht 67.0 in | Wt 166.6 lb

## 2020-03-05 DIAGNOSIS — Z7189 Other specified counseling: Secondary | ICD-10-CM

## 2020-03-05 DIAGNOSIS — E7800 Pure hypercholesterolemia, unspecified: Secondary | ICD-10-CM | POA: Diagnosis not present

## 2020-03-05 DIAGNOSIS — I25118 Atherosclerotic heart disease of native coronary artery with other forms of angina pectoris: Secondary | ICD-10-CM

## 2020-03-05 DIAGNOSIS — K2270 Barrett's esophagus without dysplasia: Secondary | ICD-10-CM | POA: Diagnosis not present

## 2020-03-05 DIAGNOSIS — I10 Essential (primary) hypertension: Secondary | ICD-10-CM

## 2020-03-05 DIAGNOSIS — Z0000 Encounter for general adult medical examination without abnormal findings: Secondary | ICD-10-CM

## 2020-03-05 MED ORDER — OMEPRAZOLE 20 MG PO CPDR: 20.0000 mg | DELAYED_RELEASE_CAPSULE | ORAL | Status: DC

## 2020-03-05 MED ORDER — ROSUVASTATIN CALCIUM 20 MG PO TABS: 10.0000 mg | ORAL_TABLET | Freq: Every day | ORAL | Status: DC

## 2020-03-05 NOTE — Patient Instructions (Signed)
Your repeat BP was fine.  Don't change your meds for now.  Update me as needed.   Take care.  Glad to see you. Thank you for your effort.

## 2020-03-05 NOTE — Progress Notes (Signed)
This visit occurred during the SARS-CoV-2 public health emergency.  Safety protocols were in place, including screening questions prior to the visit, additional usage of staff PPE, and extensive cleaning of exam room while observing appropriate contact time as indicated for disinfecting solutions.  Advance directive- wife designated if patient were incapacitated. Defer colonoscopy and PSA given his age.D/w pt. He agrees.  shingrix prev done.  tdap 2018 Flu d/w pt PNA up to date.  covid 2021  He is still working.  He is still doing his yardwork.  He put out 2 cubic yards of pine mulch in a day recently.  He is still going to the gym.    Barrett's esophagus with PPI MWF at baseline.  No GERD and doing well.    Hypertension:    Using medication without problems or lightheadedness: yes Chest pain with exertion:no Edema: he has long standing episodic L ankle swelling.   Short of breath:no His bp is lower on home checks.  Usually 939-030S systolic and controlled.   Labs d/w pt.   Still on amlodipine, lisinopril and metoprolol.    Elevated Cholesterol: Using medications without problems: yes Muscle aches: no Diet compliance: yes Exercise: yes Still on crestor.    PMH and SH reviewed  Meds, vitals, and allergies reviewed.   ROS: Per HPI unless specifically indicated in ROS section   GEN: nad, alert and oriented HEENT: ncat NECK: supple w/o LA CV: rrr. PULM: ctab, no inc wob ABD: soft, +bs EXT: no edema on R, trace on the L.   SKIN: no acute rash

## 2020-03-07 NOTE — Assessment & Plan Note (Signed)
Advance directive- wife designated if patient were incapacitated.   Defer colonoscopy and PSA given his age.  D/w pt.  He agrees. shingrix prev done. tdap 2018 Flu d/w pt PNA up to date.  covid 2021 

## 2020-03-07 NOTE — Assessment & Plan Note (Signed)
Labs d/w pt.   Still on amlodipine, lisinopril and metoprolol.   Continue as is.  He agrees.

## 2020-03-07 NOTE — Assessment & Plan Note (Signed)
Advance directive- wife designated if patient were incapacitated.  

## 2020-03-07 NOTE — Assessment & Plan Note (Signed)
Barrett's esophagus with PPI MWF at baseline.  No GERD and doing well.   Would continue omeprazole as is.

## 2020-03-07 NOTE — Assessment & Plan Note (Signed)
Still on crestor.   Continue as is.  He agrees.  He is active.

## 2020-03-08 ENCOUNTER — Ambulatory Visit: Payer: Medicare Other

## 2020-03-08 ENCOUNTER — Telehealth: Payer: Self-pay

## 2020-03-08 NOTE — Telephone Encounter (Signed)
Called patient to complete his Medicare wellness visit. Patient wanted to cancel appointment because he was on his way to the beach. Will call back to reschedule appointment.

## 2020-06-18 DIAGNOSIS — Z23 Encounter for immunization: Secondary | ICD-10-CM | POA: Diagnosis not present

## 2020-07-10 DIAGNOSIS — M62838 Other muscle spasm: Secondary | ICD-10-CM | POA: Diagnosis not present

## 2020-07-10 DIAGNOSIS — M9903 Segmental and somatic dysfunction of lumbar region: Secondary | ICD-10-CM | POA: Diagnosis not present

## 2020-07-10 DIAGNOSIS — M9904 Segmental and somatic dysfunction of sacral region: Secondary | ICD-10-CM | POA: Diagnosis not present

## 2020-07-10 DIAGNOSIS — M461 Sacroiliitis, not elsewhere classified: Secondary | ICD-10-CM | POA: Diagnosis not present

## 2020-07-10 DIAGNOSIS — M5442 Lumbago with sciatica, left side: Secondary | ICD-10-CM | POA: Diagnosis not present

## 2020-07-12 DIAGNOSIS — M62838 Other muscle spasm: Secondary | ICD-10-CM | POA: Diagnosis not present

## 2020-07-12 DIAGNOSIS — M9903 Segmental and somatic dysfunction of lumbar region: Secondary | ICD-10-CM | POA: Diagnosis not present

## 2020-07-12 DIAGNOSIS — M5442 Lumbago with sciatica, left side: Secondary | ICD-10-CM | POA: Diagnosis not present

## 2020-07-12 DIAGNOSIS — M461 Sacroiliitis, not elsewhere classified: Secondary | ICD-10-CM | POA: Diagnosis not present

## 2020-07-12 DIAGNOSIS — M9904 Segmental and somatic dysfunction of sacral region: Secondary | ICD-10-CM | POA: Diagnosis not present

## 2020-07-16 DIAGNOSIS — M9903 Segmental and somatic dysfunction of lumbar region: Secondary | ICD-10-CM | POA: Diagnosis not present

## 2020-07-16 DIAGNOSIS — M5442 Lumbago with sciatica, left side: Secondary | ICD-10-CM | POA: Diagnosis not present

## 2020-07-16 DIAGNOSIS — M461 Sacroiliitis, not elsewhere classified: Secondary | ICD-10-CM | POA: Diagnosis not present

## 2020-07-16 DIAGNOSIS — M62838 Other muscle spasm: Secondary | ICD-10-CM | POA: Diagnosis not present

## 2020-07-16 DIAGNOSIS — M9904 Segmental and somatic dysfunction of sacral region: Secondary | ICD-10-CM | POA: Diagnosis not present

## 2020-07-19 DIAGNOSIS — M5442 Lumbago with sciatica, left side: Secondary | ICD-10-CM | POA: Diagnosis not present

## 2020-07-19 DIAGNOSIS — M62838 Other muscle spasm: Secondary | ICD-10-CM | POA: Diagnosis not present

## 2020-07-19 DIAGNOSIS — M461 Sacroiliitis, not elsewhere classified: Secondary | ICD-10-CM | POA: Diagnosis not present

## 2020-07-19 DIAGNOSIS — M9903 Segmental and somatic dysfunction of lumbar region: Secondary | ICD-10-CM | POA: Diagnosis not present

## 2020-07-19 DIAGNOSIS — M9904 Segmental and somatic dysfunction of sacral region: Secondary | ICD-10-CM | POA: Diagnosis not present

## 2020-07-24 DIAGNOSIS — M9904 Segmental and somatic dysfunction of sacral region: Secondary | ICD-10-CM | POA: Diagnosis not present

## 2020-07-24 DIAGNOSIS — M9903 Segmental and somatic dysfunction of lumbar region: Secondary | ICD-10-CM | POA: Diagnosis not present

## 2020-07-24 DIAGNOSIS — M461 Sacroiliitis, not elsewhere classified: Secondary | ICD-10-CM | POA: Diagnosis not present

## 2020-07-24 DIAGNOSIS — M62838 Other muscle spasm: Secondary | ICD-10-CM | POA: Diagnosis not present

## 2020-07-24 DIAGNOSIS — M5442 Lumbago with sciatica, left side: Secondary | ICD-10-CM | POA: Diagnosis not present

## 2020-07-30 DIAGNOSIS — M5442 Lumbago with sciatica, left side: Secondary | ICD-10-CM | POA: Diagnosis not present

## 2020-07-30 DIAGNOSIS — M9904 Segmental and somatic dysfunction of sacral region: Secondary | ICD-10-CM | POA: Diagnosis not present

## 2020-07-30 DIAGNOSIS — M62838 Other muscle spasm: Secondary | ICD-10-CM | POA: Diagnosis not present

## 2020-07-30 DIAGNOSIS — M9903 Segmental and somatic dysfunction of lumbar region: Secondary | ICD-10-CM | POA: Diagnosis not present

## 2020-07-30 DIAGNOSIS — M461 Sacroiliitis, not elsewhere classified: Secondary | ICD-10-CM | POA: Diagnosis not present

## 2020-08-08 ENCOUNTER — Other Ambulatory Visit: Payer: Self-pay | Admitting: Family Medicine

## 2020-08-09 NOTE — Telephone Encounter (Signed)
Pharmacy requests refill on: Rosuvastatin Calcium 20 mg   LAST REFILL: 03/05/2020 LAST OV: 03/05/2020 NEXT OV: Not Scheduled  PHARMACY: [facility] [phone] in [place], [place]

## 2020-08-14 ENCOUNTER — Other Ambulatory Visit: Payer: Self-pay | Admitting: Family Medicine

## 2020-08-16 DIAGNOSIS — M9903 Segmental and somatic dysfunction of lumbar region: Secondary | ICD-10-CM | POA: Diagnosis not present

## 2020-08-16 DIAGNOSIS — M62838 Other muscle spasm: Secondary | ICD-10-CM | POA: Diagnosis not present

## 2020-08-16 DIAGNOSIS — M461 Sacroiliitis, not elsewhere classified: Secondary | ICD-10-CM | POA: Diagnosis not present

## 2020-08-16 DIAGNOSIS — M5442 Lumbago with sciatica, left side: Secondary | ICD-10-CM | POA: Diagnosis not present

## 2020-08-16 DIAGNOSIS — M9904 Segmental and somatic dysfunction of sacral region: Secondary | ICD-10-CM | POA: Diagnosis not present

## 2020-09-10 NOTE — Progress Notes (Signed)
Cardiology Office Note  Date:  09/11/2020   ID:  Barry Small, DOB June 13, 1934, MRN [id number]  PCP:  Joaquim Nam, MD   Chief Complaint  Patient presents with  . Follow-up    12 month F/U-No new cardiac concerns    HPI:  Barry Small is a pleasant 87 year old gentleman with a history of  coronary artery disease,  4 vessel bypass in 1989 at [facility],  stenting in 2007 after presenting with epigastric and chest discomfort, Hyperlipidemia presenting for routine followup of his coronary artery disease  Previous stress Myoview test May 2017 showing no ischemia Repeat stress test March 2019 again with no significant ischemia Treadmill 09/26/2019  Tolerating Crestor, aspirin, beta-blocker, ACE inhibitor amlodipine caused leg swelling  Does walk on treadmill, bike 20 min , 2-3 x a week Active,mows  Thinks he had covid sept 2020 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, 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 7 to 1 pm, Off in the summer Drives the bus for [employer] 3 hours a day  No anginal sx at this time No exercise program, active  Blood pressure elevated today on initial check,  Improved numbers on recheck by myself reports it is typically well controlled at home On average 130 systolic at home  EKG personally reviewed by myself on todays visit Shows sinus bradycardia rate 65 bpm old inferior MI No significant change home  Nuclear stress 11/2017, no significant ischemia Perfusion abnormality of the basal inferior wall to mid to distal region consistent with previous inferior wall MI Inferior wall hypokinesis,  EF estimated at 47% No EKG changes concerning for ischemia at peak stress or in recovery. Resting EKG with old inferior MI  Other past medical history Previous pneumonia in February 2014  His last stress nuclear study was in May 2017. EF was normal with an old inferior wall infarct scar. There was no ischemia.   PMH:   has a past medical history of Barrett's  esophagus, Colon polyps (04/2003), Coronary artery disease, Elevated bilirubin, Elevated glucose, GERD (gastroesophageal reflux disease), Hyperlipidemia, Hypertension, Myocardial infarction (HCC), and Pneumonia (2014).  PSH:    Past Surgical History:  Procedure Laterality Date  . BELPHAROPTOSIS REPAIR  08/2018  . Cardiolite  12/2001   h/o vessel infarct EF 66%  . CATARACT EXTRACTION W/ INTRAOCULAR LENS  IMPLANT, BILATERAL  09/2018  . CHOLECYSTECTOMY  09/2001  . COLONOSCOPY  11/15/2008   Mild divertics, 5 yrs, Dr. Russella Dar  . COLONOSCOPY W/ POLYPECTOMY  05/09/2003   x multiple, Dr. Mechele Collin  . CORONARY ANGIOPLASTY  2001   x 2, Dr.Pulsipher  . CORONARY ANGIOPLASTY WITH STENT PLACEMENT  12/05/2005  . CORONARY ARTERY BYPASS GRAFT  1989   [facility]  . ESOPHAGOGASTRODUODENOSCOPY  05/09/2003   Barrett's  . ESOPHAGOGASTRODUODENOSCOPY  11/17/08   Barrett's Esoph Erosive Esoph HH (Dr. Russella Dar)  2 years  . EYE SURGERY  09/2018   cataract extraction  . [facility]  03/28-03/31/2007   Chest pain  . NASAL SINUS SURGERY  06/23/2008   Dr.Clark  . NOSE SURGERY  06/23/2008   Dr. Chestine Spore  . Stress Myoview  09/30/2004   Normal EF 70%  . Stress Myoview  02/11/2006   No change, c/w 09/2004  . Stress Myoview  02/22/2008   Abnormal w/prior inf infarct  No signif ischemia    Current Outpatient Medications  Medication Sig Dispense Refill  . aspirin 81 MG EC tablet Take 81 mg by mouth daily.    . Cholecalciferol (VITAMIN D3)  2000 units TABS Take by mouth daily.    . Coenzyme Q10 (CO Q-10) 100 MG CAPS Take 300 mg by mouth daily.    . Cyanocobalamin (VITAMIN B-12) 5000 MCG SUBL Place under the tongue daily.    Marland Kitchen lisinopril (ZESTRIL) 20 MG tablet TAKE 1 TABLET BY MOUTH EVERY DAY 90 tablet 3  . metoprolol tartrate (LOPRESSOR) 25 MG tablet TAKE 0.5 TABLETS (12.5 MG TOTAL) BY MOUTH 2 (TWO) TIMES DAILY. 90 tablet 3  . Multiple Vitamins-Minerals (ZINC PO) Take 1,000 mg by mouth daily.    . nitroGLYCERIN (NITROSTAT) 0.4 MG SL  tablet DISSOLVE ONE TABLET UNDER TONGUE EVERY 5 MINUTES AS NEEDED FOR CHEST PAIN 25 tablet 12  . omeprazole (PRILOSEC) 20 MG capsule Take 1 capsule (20 mg total) by mouth every Monday, Wednesday, and Friday.    . polyethylene glycol powder (GLYCOLAX/MIRALAX) powder Take 17 g by mouth daily as needed for mild constipation.    . rosuvastatin (CRESTOR) 20 MG tablet TAKE 1 TABLET BY MOUTH EVERY DAY 90 tablet 1   No current facility-administered medications for this visit.     Allergies:   Atorvastatin, Morphine, and Simvastatin   Social History:  The patient  reports that he has never smoked. He has never used smokeless tobacco. He reports current alcohol use. He reports that he does not use drugs.   Family History:   family history includes Heart disease in his father; Pneumonia in his mother.    Review of Systems: Review of Systems  Constitutional: Negative.   Respiratory: Negative.   Cardiovascular: Negative.   Gastrointestinal: Negative.   Musculoskeletal: Negative.   Neurological: Negative.   Psychiatric/Behavioral: Negative.   All other systems reviewed and are negative.   PHYSICAL EXAM: VS:  BP (!) 160/70 (BP Location: Left Arm, Patient Position: Sitting, Cuff Size: Normal)   Pulse 64   Ht 5\' 5"  (1.651 m)   Wt 168 lb (76.2 kg)   SpO2 98%   BMI 27.96 kg/m  , BMI Body mass index is 27.96 kg/m. Constitutional:  oriented to person, place, and time. No distress.  HENT:  Head: Grossly normal Eyes:  no discharge. No scleral icterus.  Neck: No JVD, no carotid bruits  Cardiovascular: Regular rate and rhythm, no murmurs appreciated Pulmonary/Chest: Clear to auscultation bilaterally, no wheezes or rails Abdominal: Soft.  no distension.  no tenderness.  Musculoskeletal: Normal range of motion Neurological:  normal muscle tone. Coordination normal. No atrophy Skin: Skin warm and dry Psychiatric: normal affect, pleasant   Recent Labs: 02/27/2020: ALT 19; BUN 13; Creatinine,  Ser 0.73; Potassium 4.7; Sodium 132    Lipid Panel Lab Results  Component Value Date   CHOL 118 02/27/2020   HDL 43.70 02/27/2020   LDLCALC 61 02/27/2020   TRIG 70.0 02/27/2020    Wt Readings from Last 3 Encounters:  09/11/20 168 lb (76.2 kg)  03/05/20 166 lb 9 oz (75.6 kg)  09/19/19 170 lb 12 oz (77.5 kg)     ASSESSMENT AND PLAN:  Essential hypertension - Plan: EKG 12-Lead Initial pressure elevated but did improve on recheck Recommend he continue to monitor at home, no changes made  Atherosclerosis of native coronary artery without angina pectoris, unspecified whether native or transplanted heart - Old inferior MI Previous stress Myoview test May 2017 showing no ischemia Repeat stress test March 2019 again with no significant ischemia No angina, good exercise tolerance  HYPERCHOLESTEROLEMIA Cholesterol is at goal on the current lipid regimen. No changes to the medications  were made.  CDL renewal Has stress test < 1 year ago, No new anginal sx Acceptable risk   Total encounter time more than 25 minutes  Greater than 50% was spent in counseling and coordination of care with the patient   No orders of the defined types were placed in this encounter.    Signed, Esmond Plants, M.D., Ph.D. 09/11/2020  [place], [place]

## 2020-09-11 ENCOUNTER — Other Ambulatory Visit: Payer: Self-pay

## 2020-09-11 ENCOUNTER — Encounter: Payer: Self-pay | Admitting: Cardiovascular Disease

## 2020-09-11 ENCOUNTER — Ambulatory Visit (INDEPENDENT_AMBULATORY_CARE_PROVIDER_SITE_OTHER): Payer: Medicare Other | Admitting: Cardiovascular Disease

## 2020-09-11 VITALS — BP 143/70 | HR 64 | Ht 65.0 in | Wt 168.0 lb

## 2020-09-11 DIAGNOSIS — I10 Essential (primary) hypertension: Secondary | ICD-10-CM

## 2020-09-11 DIAGNOSIS — E782 Mixed hyperlipidemia: Secondary | ICD-10-CM | POA: Diagnosis not present

## 2020-09-11 DIAGNOSIS — I25118 Atherosclerotic heart disease of native coronary artery with other forms of angina pectoris: Secondary | ICD-10-CM

## 2020-09-11 DIAGNOSIS — Z951 Presence of aortocoronary bypass graft: Secondary | ICD-10-CM | POA: Diagnosis not present

## 2020-09-11 NOTE — Patient Instructions (Signed)
Medication Instructions:  No medication changes  *If you need a refill on your cardiac medications before your next appointment, please call your pharmacy*   Lab Work: None If you have labs (blood work) drawn today and your tests are completely normal, you will receive your results only by: Marland Kitchen MyChart Message (if you have MyChart) OR . A paper copy in the mail If you have any lab test that is abnormal or we need to change your treatment, we will call you to review the results.   Testing/Procedures: None   Follow-Up: At [facility], you and your health needs are our priority.  As part of our continuing mission to provide you with exceptional heart care, we have created designated Provider Care Teams.  These Care Teams include your primary Cardiologist (physician) and Advanced Practice Providers (APPs -  Physician Assistants and Nurse Practitioners) who all work together to provide you with the care you need, when you need it.  We recommend signing up for the patient portal called "MyChart".  Sign up information is provided on this After Visit Summary.  MyChart is used to connect with patients for Virtual Visits (Telemedicine).  Patients are able to view lab/test results, encounter notes, upcoming appointments, etc.  Non-urgent messages can be sent to your provider as well.   To learn more about what you can do with MyChart, go to [email or web address].    Your next appointment:   12 month(s)  The format for your next appointment:   In Person  Provider:   You may see No primary care provider on file. Dr. Mariah Milling or one of the following Advanced Practice Providers on your designated Care Team:    Nicolasa Ducking, NP  Eula Listen, PA-C  Marisue Ivan, PA-C  Cadence Lyle, [place]  Gillian Shields, NP    Other Instructions You was given a letter to maintain your CDL based on your cardiac history

## 2020-10-02 DIAGNOSIS — H04123 Dry eye syndrome of bilateral lacrimal glands: Secondary | ICD-10-CM | POA: Diagnosis not present

## 2020-10-26 ENCOUNTER — Other Ambulatory Visit: Payer: Self-pay

## 2020-10-26 ENCOUNTER — Emergency Department
Admission: EM | Admit: 2020-10-26 | Discharge: 2020-10-26 | Disposition: A | Discharge status: Home / Self Care | Payer: Medicare Other | Attending: Student in an Organized Health Care Education/Training Program | Admitting: Student in an Organized Health Care Education/Training Program

## 2020-10-26 ENCOUNTER — Emergency Department: Payer: Medicare Other

## 2020-10-26 ENCOUNTER — Telehealth: Payer: Self-pay | Admitting: Cardiovascular Disease

## 2020-10-26 DIAGNOSIS — Z79899 Other long term (current) drug therapy: Secondary | ICD-10-CM | POA: Insufficient documentation

## 2020-10-26 DIAGNOSIS — I10 Essential (primary) hypertension: Secondary | ICD-10-CM | POA: Insufficient documentation

## 2020-10-26 DIAGNOSIS — R0902 Hypoxemia: Secondary | ICD-10-CM | POA: Diagnosis not present

## 2020-10-26 DIAGNOSIS — Z7982 Long term (current) use of aspirin: Secondary | ICD-10-CM | POA: Diagnosis not present

## 2020-10-26 DIAGNOSIS — R6883 Chills (without fever): Secondary | ICD-10-CM | POA: Diagnosis present

## 2020-10-26 DIAGNOSIS — Z20822 Contact with and (suspected) exposure to covid-19: Secondary | ICD-10-CM | POA: Diagnosis not present

## 2020-10-26 DIAGNOSIS — R42 Dizziness and giddiness: Secondary | ICD-10-CM | POA: Diagnosis not present

## 2020-10-26 DIAGNOSIS — A419 Sepsis, unspecified organism: Secondary | ICD-10-CM | POA: Diagnosis not present

## 2020-10-26 DIAGNOSIS — I517 Cardiomegaly: Secondary | ICD-10-CM | POA: Diagnosis not present

## 2020-10-26 DIAGNOSIS — I25118 Atherosclerotic heart disease of native coronary artery with other forms of angina pectoris: Secondary | ICD-10-CM | POA: Insufficient documentation

## 2020-10-26 DIAGNOSIS — E860 Dehydration: Secondary | ICD-10-CM | POA: Diagnosis not present

## 2020-10-26 DIAGNOSIS — I959 Hypotension, unspecified: Secondary | ICD-10-CM | POA: Diagnosis not present

## 2020-10-26 DIAGNOSIS — R5381 Other malaise: Secondary | ICD-10-CM | POA: Insufficient documentation

## 2020-10-26 DIAGNOSIS — Z951 Presence of aortocoronary bypass graft: Secondary | ICD-10-CM | POA: Diagnosis not present

## 2020-10-26 DIAGNOSIS — I700 Atherosclerosis of aorta: Secondary | ICD-10-CM | POA: Diagnosis not present

## 2020-10-26 DIAGNOSIS — M47816 Spondylosis without myelopathy or radiculopathy, lumbar region: Secondary | ICD-10-CM | POA: Diagnosis not present

## 2020-10-26 DIAGNOSIS — M4186 Other forms of scoliosis, lumbar region: Secondary | ICD-10-CM | POA: Diagnosis not present

## 2020-10-26 DIAGNOSIS — R509 Fever, unspecified: Secondary | ICD-10-CM | POA: Insufficient documentation

## 2020-10-26 LAB — COMPREHENSIVE METABOLIC PANEL
ALT: 28 U/L (ref 0–44)
AST: 42 U/L — ABNORMAL HIGH (ref 15–41)
Albumin: 3.6 g/dL (ref 3.5–5.0)
Alkaline Phosphatase: 55 U/L (ref 38–126)
Anion gap: 9 (ref 5–15)
BUN: 13 mg/dL (ref 8–23)
CO2: 23 mmol/L (ref 22–32)
Calcium: 8.3 mg/dL — ABNORMAL LOW (ref 8.9–10.3)
Chloride: 99 mmol/L (ref 98–111)
Creatinine, Ser: 0.87 mg/dL (ref 0.61–1.24)
GFR, Estimated: >60 mL/min (ref >60)
Glucose, Bld: 130 mg/dL — ABNORMAL HIGH (ref 70–99)
Potassium: 3.8 mmol/L (ref 3.5–5.1)
Sodium: 131 mmol/L — ABNORMAL LOW (ref 135–145)
Total Bilirubin: 0.8 mg/dL (ref 0.3–1.2)
Total Protein: 6.2 g/dL — ABNORMAL LOW (ref 6.5–8.1)

## 2020-10-26 LAB — CBC WITH DIFFERENTIAL/PLATELET
Abs Immature Granulocytes: 0.09 10*3/uL — ABNORMAL HIGH (ref 0.00–0.07)
Basophils Absolute: 0 10*3/uL (ref 0.0–0.1)
Basophils Relative: 0 %
Eosinophils Absolute: 0 10*3/uL (ref 0.0–0.5)
Eosinophils Relative: 0 %
HCT: 35.5 % — ABNORMAL LOW (ref 39.0–52.0)
Hemoglobin: 12.3 g/dL — ABNORMAL LOW (ref 13.0–17.0)
Immature Granulocytes: 1 %
Lymphocytes Relative: 9 %
Lymphs Abs: 0.8 10*3/uL (ref 0.7–4.0)
MCH: 31 pg (ref 26.0–34.0)
MCHC: 34.6 g/dL (ref 30.0–36.0)
MCV: 89.4 fL (ref 80.0–100.0)
Monocytes Absolute: 1 10*3/uL (ref 0.1–1.0)
Monocytes Relative: 12 %
Neutro Abs: 6.7 10*3/uL (ref 1.7–7.7)
Neutrophils Relative %: 78 %
Platelets: 90 10*3/uL — ABNORMAL LOW (ref 150–400)
RBC: 3.97 MIL/uL — ABNORMAL LOW (ref 4.22–5.81)
RDW: 15.3 % (ref 11.5–15.5)
WBC: 8.6 10*3/uL (ref 4.0–10.5)
nRBC: 0 % (ref 0.0–0.2)

## 2020-10-26 LAB — URINALYSIS, COMPLETE (UACMP) WITH MICROSCOPIC
Bacteria, UA: NONE SEEN
Bilirubin Urine: NEGATIVE
Glucose, UA: NEGATIVE mg/dL
Hgb urine dipstick: NEGATIVE
Ketones, ur: 5 mg/dL — AB
Leukocytes,Ua: NEGATIVE
Nitrite: NEGATIVE
Protein, ur: NEGATIVE mg/dL
Specific Gravity, Urine: >1.046 — ABNORMAL HIGH (ref 1.005–1.030)
pH: 6 (ref 5.0–8.0)

## 2020-10-26 LAB — POC SARS CORONAVIRUS 2 AG -  ED: SARS Coronavirus 2 Ag: NEGATIVE

## 2020-10-26 LAB — RESP PANEL BY RT-PCR (FLU A&B, COVID) ARPGX2
Influenza A by PCR: NEGATIVE
Influenza B by PCR: NEGATIVE
SARS Coronavirus 2 by RT PCR: NEGATIVE

## 2020-10-26 LAB — TROPONIN I (HIGH SENSITIVITY): Troponin I (High Sensitivity): 8 ng/L

## 2020-10-26 LAB — LACTIC ACID, PLASMA: Lactic Acid, Venous: 1.9 mmol/L (ref 0.5–1.9)

## 2020-10-26 MED ORDER — SODIUM CHLORIDE 0.9 % IV BOLUS
500.0000 mL | Freq: Once | INTRAVENOUS | Status: AC
  Administered 2020-10-26: 500 mL via INTRAVENOUS

## 2020-10-26 MED ORDER — IOHEXOL 300 MG/ML  SOLN
100.0000 mL | Freq: Once | INTRAMUSCULAR | Status: AC | PRN
  Administered 2020-10-26: 100 mL via INTRAVENOUS

## 2020-10-26 NOTE — ED Provider Notes (Signed)
[facility] Emergency Department Provider Note    Event Date/Time   First MD Initiated Contact with Patient 10/26/20 1511     (approximate)  I have reviewed the triage vital signs and the nursing notes.   HISTORY  Chief Complaint Fever and Dizziness    HPI Barry Small is a 87 y.o. male below listed past medical history presents to the ER for evaluation of chills generalized malaise.  States that it was having some indigestion starting last night.  Did feel like he was having some chills last night but woke up this morning was otherwise feeling normal.  Has had some decreased urine output and states his urine is darker in color.  Worries that he may not have been drinking as much fluid as he needs.  Denies any abdominal pain.  No cough or congestion.  No recent antibiotics.  No vomiting or diarrhea.    Past Medical History:  Diagnosis Date  . Barrett's esophagus   . Colon polyps 04/2003   type unknown   . Coronary artery disease   . Elevated bilirubin   . Elevated glucose   . GERD (gastroesophageal reflux disease)   . Hyperlipidemia   . Hypertension   . Myocardial infarction ([place])   . Pneumonia 2014   Family History  Problem Relation Age of Onset  . Heart disease Father   . Pneumonia Mother   . Colon cancer Neg Hx   . Prostate cancer Neg Hx    Past Surgical History:  Procedure Laterality Date  . [place]  08/2018  . Cardiolite  12/2001   h/o vessel infarct EF 66%  . CATARACT EXTRACTION W/ INTRAOCULAR LENS  IMPLANT, BILATERAL  09/2018  . CHOLECYSTECTOMY  09/2001  . COLONOSCOPY  11/15/2008   Mild divertics, 5 yrs, Dr. Fuller Plan  . COLONOSCOPY W/ POLYPECTOMY  05/09/2003   x multiple, Dr. Vira Agar  . CORONARY ANGIOPLASTY  2001   x 2, Dr.Pulsipher  . CORONARY ANGIOPLASTY WITH STENT PLACEMENT  12/05/2005  . CORONARY ARTERY BYPASS GRAFT  1989   [facility]  . ESOPHAGOGASTRODUODENOSCOPY  05/09/2003   Barrett's  .  ESOPHAGOGASTRODUODENOSCOPY  11/17/08   Barrett's Esoph Erosive Esoph HH (Dr. Fuller Plan)  2 years  . EYE SURGERY  09/2018   cataract extraction  . [facility]  03/28-03/31/2007   Chest pain  . NASAL SINUS SURGERY  06/23/2008   Dr.Clark  . NOSE SURGERY  06/23/2008   Dr. Carlis Abbott  . Stress Myoview  09/30/2004   Normal EF 70%  . Stress Myoview  02/11/2006   No change, c/w 09/2004  . Stress Myoview  02/22/2008   Abnormal w/prior inf infarct  No signif ischemia   Patient Active Problem List   Diagnosis Date Noted  . Atherosclerosis of native coronary artery of native heart with stable angina pectoris ([place]) 10/03/2018  . Mixed hyperlipidemia 10/03/2018  . Constipation 08/18/2018  . Health care maintenance 01/28/2018  . URI (upper respiratory infection) 12/12/2017  . Hx of CABG 06/16/2016  . Advance care planning 11/01/2014  . Tremor 10/21/2013  . Medicare annual wellness visit, subsequent 07/30/2012  . BARRETT'S ESOPHAGUS 11/01/2008  . History of colonic polyps 11/01/2008  . CHRONIC RHINITIS 05/10/2008  . SHOULDER PAIN, RIGHT 05/28/2007  . HYPERCHOLESTEROLEMIA 05/19/2007  . GILBERT'S SYNDROME 05/19/2007  . Essential hypertension 05/19/2007  . Coronary atherosclerosis 05/19/2007  . GERD 05/19/2007  . HYPERGLYCEMIA 05/19/2007      Prior to Admission medications   Medication Sig Start Date  End Date Taking? Authorizing Provider  aspirin 81 MG EC tablet Take 81 mg by mouth daily.    [provider]  Cholecalciferol (VITAMIN D3) 2000 units TABS Take by mouth daily.    [provider]  Coenzyme Q10 (CO Q-10) 100 MG CAPS Take 300 mg by mouth daily.    [provider]  Cyanocobalamin (VITAMIN B-12) 5000 MCG SUBL Place under the tongue daily.    [provider]  lisinopril (ZESTRIL) 20 MG tablet TAKE 1 TABLET BY MOUTH EVERY DAY 01/03/20   Tonia Ghent, MD  metoprolol tartrate (LOPRESSOR) 25 MG tablet TAKE 0.5 TABLETS (12.5 MG TOTAL) BY MOUTH 2 (TWO) TIMES DAILY.  01/03/20   Tonia Ghent, MD  Multiple Vitamins-Minerals (ZINC PO) Take 1,000 mg by mouth daily.    [provider]  nitroGLYCERIN (NITROSTAT) 0.4 MG SL tablet DISSOLVE ONE TABLET UNDER TONGUE EVERY 5 MINUTES AS NEEDED FOR CHEST PAIN 08/14/20   Tonia Ghent, MD  omeprazole (PRILOSEC) 20 MG capsule Take 1 capsule (20 mg total) by mouth every Monday, Wednesday, and Friday. 03/05/20   Tonia Ghent, MD  polyethylene glycol powder (GLYCOLAX/MIRALAX) powder Take 17 g by mouth daily as needed for mild constipation. 08/17/18   Tonia Ghent, MD  rosuvastatin (CRESTOR) 20 MG tablet TAKE 1 TABLET BY MOUTH EVERY DAY 08/09/20   Tonia Ghent, MD    Allergies Atorvastatin, Morphine, and Simvastatin    Social History Social History   Tobacco Use  . Smoking status: Never Smoker  . Smokeless tobacco: Never Used  Vaping Use  . Vaping Use: Never used  Substance Use Topics  . Alcohol use: Yes    Comment: occassional beer-socially  . Drug use: No    Review of Systems Patient denies headaches, rhinorrhea, blurry vision, numbness, shortness of breath, chest pain, edema, cough, abdominal pain, nausea, vomiting, diarrhea, dysuria, fevers, rashes or hallucinations unless otherwise stated above in HPI. ____________________________________________   PHYSICAL EXAM:  VITAL SIGNS: Vitals:   10/26/20 1800 10/26/20 1830  BP: (!) 108/55 116/61  Pulse: 60 62  Resp: (!) 28 19  Temp:    SpO2: 96% 93%    Constitutional: Alert and oriented.  Eyes: Conjunctivae are normal.  Head: Atraumatic. Nose: No congestion/rhinnorhea. Mouth/Throat: Mucous membranes are moist.   Neck: No stridor. Painless ROM.  Cardiovascular: Normal rate, regular rhythm. Grossly normal heart sounds.  Good peripheral circulation. Respiratory: Normal respiratory effort.  No retractions. Lungs CTAB. Gastrointestinal: Soft and nontender. No distention. No abdominal bruits. No CVA tenderness. Genitourinary:   Musculoskeletal: No lower extremity tenderness nor edema.  No joint effusions. Neurologic:  Normal speech and language. No gross focal neurologic deficits are appreciated. No facial droop Skin:  Skin is warm, dry and intact. No rash noted. Psychiatric: Mood and affect are normal. Speech and behavior are normal.  ____________________________________________   LABS (all labs ordered are listed, but only abnormal results are displayed)  Results for orders placed or performed during the hospital encounter of 10/26/20 (from the past 24 hour(s))  Lactic acid, plasma     Status: None   Collection Time: 10/26/20  3:20 PM  Result Value Ref Range   Lactic Acid, Venous 1.9 0.5 - 1.9 mmol/L  Comprehensive metabolic panel     Status: Abnormal   Collection Time: 10/26/20  3:20 PM  Result Value Ref Range   Sodium 131 (L) 135 - 145 mmol/L   Potassium 3.8 3.5 - 5.1 mmol/L   Chloride 99  98 - 111 mmol/L   CO2 23 22 - 32 mmol/L   Glucose, Bld 130 (H) 70 - 99 mg/dL   BUN 13 8 - 23 mg/dL   Creatinine, Ser 0.87 0.61 - 1.24 mg/dL   Calcium 8.3 (L) 8.9 - 10.3 mg/dL   Total Protein 6.2 (L) 6.5 - 8.1 g/dL   Albumin 3.6 3.5 - 5.0 g/dL   AST 42 (H) 15 - 41 U/L   ALT 28 0 - 44 U/L   Alkaline Phosphatase 55 38 - 126 U/L   Total Bilirubin 0.8 0.3 - 1.2 mg/dL   GFR, Estimated >60 >60 mL/min   Anion gap 9 5 - 15  CBC WITH DIFFERENTIAL     Status: Abnormal   Collection Time: 10/26/20  3:20 PM  Result Value Ref Range   WBC 8.6 4.0 - 10.5 K/uL   RBC 3.97 (L) 4.22 - 5.81 MIL/uL   Hemoglobin 12.3 (L) 13.0 - 17.0 g/dL   HCT 35.5 (L) 39.0 - 52.0 %   MCV 89.4 80.0 - 100.0 fL   MCH 31.0 26.0 - 34.0 pg   MCHC 34.6 30.0 - 36.0 g/dL   RDW 15.3 11.5 - 15.5 %   Platelets 90 (L) 150 - 400 K/uL   nRBC 0.0 0.0 - 0.2 %   Neutrophils Relative % 78 %   Neutro Abs 6.7 1.7 - 7.7 K/uL   Lymphocytes Relative 9 %   Lymphs Abs 0.8 0.7 - 4.0 K/uL   Monocytes Relative 12 %   Monocytes Absolute 1.0 0.1 - 1.0 K/uL    Eosinophils Relative 0 %   Eosinophils Absolute 0.0 0.0 - 0.5 K/uL   Basophils Relative 0 %   Basophils Absolute 0.0 0.0 - 0.1 K/uL   Immature Granulocytes 1 %   Abs Immature Granulocytes 0.09 (H) 0.00 - 0.07 K/uL  Urinalysis, Complete w Microscopic Urine, Clean Catch     Status: Abnormal   Collection Time: 10/26/20  3:20 PM  Result Value Ref Range   Color, Urine YELLOW (A) YELLOW   APPearance CLEAR (A) CLEAR   Specific Gravity, Urine >1.046 (H) 1.005 - 1.030   pH 6.0 5.0 - 8.0   Glucose, UA NEGATIVE NEGATIVE mg/dL   Hgb urine dipstick NEGATIVE NEGATIVE   Bilirubin Urine NEGATIVE NEGATIVE   Ketones, ur 5 (A) NEGATIVE mg/dL   Protein, ur NEGATIVE NEGATIVE mg/dL   Nitrite NEGATIVE NEGATIVE   Leukocytes,Ua NEGATIVE NEGATIVE   RBC / HPF 0-5 0 - 5 RBC/hpf   WBC, UA 0-5 0 - 5 WBC/hpf   Bacteria, UA NONE SEEN NONE SEEN   Squamous Epithelial / LPF 0-5 0 - 5   Mucus PRESENT   Troponin I (High Sensitivity)     Status: None   Collection Time: 10/26/20  3:20 PM  Result Value Ref Range   Troponin I (High Sensitivity) 8 <18 ng/L  POC SARS Coronavirus 2 Ag-ED - Nasal Swab     Status: None   Collection Time: 10/26/20  4:24 PM  Result Value Ref Range   SARS Coronavirus 2 Ag Negative Negative  Resp Panel by RT-PCR (Flu A&B, Covid) Nasopharyngeal Swab     Status: None   Collection Time: 10/26/20  4:53 PM   Specimen: Nasopharyngeal Swab; Nasopharyngeal(NP) swabs in vial transport medium  Result Value Ref Range   SARS Coronavirus 2 by RT PCR NEGATIVE NEGATIVE   Influenza A by PCR NEGATIVE NEGATIVE   Influenza B by PCR NEGATIVE NEGATIVE   ____________________________________________  EKG My review and personal interpretation at Time: 15:18   Indication: weakness  Rate: 70  Rhythm: afib  Axis: normal Other: normal intervals, no stemi ____________________________________________  RADIOLOGY  I personally reviewed all radiographic images ordered to evaluate for the above acute complaints and  reviewed radiology reports and findings.  These findings were personally discussed with the patient.  Please see medical record for radiology report.  ____________________________________________   PROCEDURES  Procedure(s) performed:  Procedures    Critical Care performed: no ____________________________________________   INITIAL IMPRESSION / ASSESSMENT AND PLAN / ED COURSE  Pertinent labs & imaging results that were available during my care of the patient were reviewed by me and considered in my medical decision making (see chart for details).   DDX: Sepsis, pneumonia, UTI, enteritis, viral illness, bacteremia  GLENN GULLICKSON is a 87 y.o. who presents to the ED with fever and symptoms as described above.  Patient very well-appearing clinically may be little bit dehydrated based on his history but given his fever will order septic work-up.  Given his indigestion age and risk factors will order CT of abdomen as well as chest x-ray.  Will check for Covid.  Clinical Course as of 10/26/20 1859  Fri Oct 26, 2020  1751 Patient reassessed.  States that he is feeling much better after IV fluids.  No source of fever but with no white count or lactate have a lower suspicion for sepsis.  Is tolerating p.o. right now.  Has been off flu and Covid swab. [PR]  6761 Patient well-appearing.  Ambulating with steady gait.  Feels much improved after hydration.  Though he had fever I do not see any source.  He is not complaining of any symptoms at this time.  He is not orthostatic.  Is not complaining of cough or shortness of breath abdominal exam remains benign.  No diarrheal illness.  He is tolerating p.o.  He states that he feels well and like to go home.  At this point I think he is okay for outpatient follow-up.  We discussed signs and symptoms which she should return to the ER.  Patient and family agreeable to plan. [PR]    Clinical Course User Index [PR] Merlyn Lot, MD    The patient was  evaluated in Emergency Department today for the symptoms described in the history of present illness. He/she was evaluated in the context of the global COVID-19 pandemic, which necessitated consideration that the patient might be at risk for infection with the SARS-CoV-2 virus that causes COVID-19. Institutional protocols and algorithms that pertain to the evaluation of patients at risk for COVID-19 are in a state of rapid change based on information released by regulatory bodies including the CDC and federal and state organizations. These policies and algorithms were followed during the patient's care in the ED.  As part of my medical decision making, I reviewed the following data within the [place] notes reviewed and incorporated, Labs reviewed, notes from prior ED visits and [place] Controlled Substance Database   ____________________________________________   FINAL CLINICAL IMPRESSION(S) / ED DIAGNOSES  Final diagnoses:  Fever, unspecified fever cause  Dehydration      NEW MEDICATIONS STARTED DURING THIS VISIT:  New Prescriptions   No medications on file     Note:  This document was prepared using Dragon voice recognition software and may include unintentional dictation errors.    Merlyn Lot, MD 10/26/20 [phone]

## 2020-10-26 NOTE — ED Notes (Signed)
Patient wife at bedside. Patient provided PO fluids at MD request. Patient denies complaints or concerns at this time. POC discussed with patient and wife. Lights dimmed for patient comfort.

## 2020-10-26 NOTE — ED Notes (Signed)
Patient is resting comfortably. 

## 2020-10-26 NOTE — Telephone Encounter (Signed)
Patient spouse calling  Wanted to make Dr Rockey Situ aware that patient is in hospital and would like for Dr Rockey Situ to see him if he is working the hospital this weekend

## 2020-10-26 NOTE — ED Triage Notes (Signed)
Patient presents to the ED with EMS. Patient reports dizziness, fatigue, and fever for the last few days. EMS reports the patient's temp at his home was >100 oral. Tylenol was given by EMS PTA. Patient alert, oriented x4. Respirations even and unlabored.

## 2020-10-26 NOTE — ED Notes (Signed)
Patient is resting comfortably. Patient and spouse updated on POC. Patient provided meal at MD request.

## 2020-10-26 NOTE — ED Notes (Signed)
Patient reports improvement to symptoms.

## 2020-10-26 NOTE — ED Notes (Signed)
Patient updated on POC. Patient aware of order for urine sample. Patient denies needs or concerns at this time.

## 2020-10-28 LAB — URINE CULTURE: Culture: NO GROWTH

## 2020-10-30 ENCOUNTER — Telehealth: Payer: Self-pay | Admitting: Emergency Medicine

## 2020-10-30 NOTE — Telephone Encounter (Signed)
Called patient due to report of positive blood culture to check on his condition.  Per Dr Quentin Cornwall, he needs to return if he is feeling sicker or still running fevers.  He did not answer home phone.  Left message on cell phone indicating that I was calling to check on him and left  my number to call me back.

## 2020-10-30 NOTE — Telephone Encounter (Signed)
Family member called and says Barry Small is feeling great.  Says he is back to work.  No fever.

## 2020-10-31 LAB — CULTURE, BLOOD (ROUTINE X 2)
Culture: NO GROWTH
Special Requests: ADEQUATE

## 2020-11-03 LAB — CULTURE, BLOOD (ROUTINE X 2)

## 2021-01-10 DIAGNOSIS — L738 Other specified follicular disorders: Secondary | ICD-10-CM | POA: Diagnosis not present

## 2021-01-10 DIAGNOSIS — L570 Actinic keratosis: Secondary | ICD-10-CM | POA: Diagnosis not present

## 2021-01-10 DIAGNOSIS — Z85828 Personal history of other malignant neoplasm of skin: Secondary | ICD-10-CM | POA: Diagnosis not present

## 2021-01-10 DIAGNOSIS — D485 Neoplasm of uncertain behavior of skin: Secondary | ICD-10-CM | POA: Diagnosis not present

## 2021-02-14 ENCOUNTER — Other Ambulatory Visit: Payer: Self-pay | Admitting: Family Medicine

## 2021-02-24 DIAGNOSIS — Z23 Encounter for immunization: Secondary | ICD-10-CM | POA: Diagnosis not present

## 2021-03-13 ENCOUNTER — Ambulatory Visit (INDEPENDENT_AMBULATORY_CARE_PROVIDER_SITE_OTHER): Payer: Medicare Other

## 2021-03-13 VITALS — BP 130/60 | HR 53

## 2021-03-13 DIAGNOSIS — Z0000 Encounter for general adult medical examination without abnormal findings: Secondary | ICD-10-CM | POA: Diagnosis not present

## 2021-03-13 NOTE — Progress Notes (Signed)
Subjective:   Barry Small is a 87 y.o. male who presents for Medicare Annual/Subsequent preventive examination.  Review of Systems: N/A      I connected with the patient today by telephone and verified that I am speaking with the correct person using two identifiers. Location patient: home Location nurse: work Persons participating in the telephone visit: patient, nurse.   I discussed the limitations, risks, security and privacy concerns of performing an evaluation and management service by telephone and the availability of in person appointments. I also discussed with the patient that there may be a patient responsible charge related to this service. The patient expressed understanding and verbally consented to this telephonic visit.        Cardiac Risk Factors include: advanced age (>52men, >2 women);male gender;Other (see comment), Risk factor comments: hyperlipidemia     Objective:    Today's Vitals   03/13/21 0916  BP: 130/60  Pulse: (!) 53  SpO2: 98%   There is no height or weight on file to calculate BMI.  Advanced Directives 03/13/2021 10/26/2020 02/11/2019 01/21/2018 12/02/2016  Does Patient Have a Medical Advance Directive? Yes No Yes Yes Yes  Type of Paramedic of [place];Living will - [place];Living will [place];Living will [place] in Chart? No - copy requested - No - copy requested No - copy requested No - copy requested  Would patient like information on creating a medical advance directive? - No - Patient declined - - -    Current Medications (verified) Outpatient Encounter Medications as of 03/13/2021  Medication Sig   aspirin 81 MG EC tablet Take 81 mg by mouth daily.   Cholecalciferol (VITAMIN D3) 2000 units TABS Take by mouth daily.   Coenzyme Q10 (CO Q-10) 100 MG CAPS Take 300 mg by mouth daily.   Cyanocobalamin (VITAMIN B-12)  5000 MCG SUBL Place under the tongue daily.   lisinopril (ZESTRIL) 20 MG tablet TAKE 1 TABLET BY MOUTH EVERY DAY   metoprolol tartrate (LOPRESSOR) 25 MG tablet TAKE 0.5 TABLETS BY MOUTH 2 TIMES DAILY.   Multiple Vitamins-Minerals (ZINC PO) Take 1,000 mg by mouth daily.   nitroGLYCERIN (NITROSTAT) 0.4 MG SL tablet DISSOLVE ONE TABLET UNDER TONGUE EVERY 5 MINUTES AS NEEDED FOR CHEST PAIN   omeprazole (PRILOSEC) 20 MG capsule Take 1 capsule (20 mg total) by mouth every Monday, Wednesday, and Friday.   polyethylene glycol powder (GLYCOLAX/MIRALAX) powder Take 17 g by mouth daily as needed for mild constipation.   rosuvastatin (CRESTOR) 20 MG tablet TAKE 1 TABLET BY MOUTH EVERY DAY   No facility-administered encounter medications on file as of 03/13/2021.    Allergies (verified) Atorvastatin, Morphine, and Simvastatin   History: Past Medical History:  Diagnosis Date   Barrett's esophagus    Colon polyps 04/2003   type unknown    Coronary artery disease    Elevated bilirubin    Elevated glucose    GERD (gastroesophageal reflux disease)    Hyperlipidemia    Hypertension    Myocardial infarction [facility])    Pneumonia 2014   Past Surgical History:  Procedure Laterality Date   BELPHAROPTOSIS REPAIR  08/2018   Cardiolite  12/2001   h/o vessel infarct EF 66%   CATARACT EXTRACTION W/ INTRAOCULAR LENS  IMPLANT, BILATERAL  09/2018   CHOLECYSTECTOMY  09/2001   COLONOSCOPY  11/15/2008   Mild divertics, 5 yrs, Dr. Fuller Plan   COLONOSCOPY  W/ POLYPECTOMY  05/09/2003   x multiple, Dr. Vira Agar   CORONARY ANGIOPLASTY  2001   x 2, Dr.Pulsipher   CORONARY ANGIOPLASTY WITH STENT PLACEMENT  12/05/2005   CORONARY ARTERY BYPASS GRAFT  1989   [facility]   ESOPHAGOGASTRODUODENOSCOPY  05/09/2003   Barrett's   ESOPHAGOGASTRODUODENOSCOPY  11/17/08   Barrett's Esoph Erosive Esoph HH (Dr. Fuller Plan)  2 years   EYE SURGERY  09/2018   cataract extraction   [facility]  03/28-03/31/2007   Chest pain   NASAL SINUS SURGERY   06/23/2008   Dr.Clark   NOSE SURGERY  06/23/2008   Dr. Carlis Abbott   Stress Myoview  09/30/2004   Normal EF 70%   Stress Myoview  02/11/2006   No change, c/w 09/2004   Stress Myoview  02/22/2008   Abnormal w/prior inf infarct  No signif ischemia   Family History  Problem Relation Age of Onset   Heart disease Father    Pneumonia Mother    Colon cancer Neg Hx    Prostate cancer Neg Hx    Social History   Socioeconomic History   Marital status: Married    Spouse name: Not on file   Number of children: 2   Years of education: Not on file   Highest education level: Not on file  Occupational History   Occupation: Retired Scientist, water quality: [employer]   Occupation: Part time    Employer: [employer]  Tobacco Use   Smoking status: Never   Smokeless tobacco: Never  Vaping Use   Vaping Use: Never used  Substance and Sexual Activity   Alcohol use: Yes    Comment: occassional beer-socially   Drug use: No   Sexual activity: Not Currently  Other Topics Concern   Not on file  Social History Narrative   Caffeine use:  1   Minimal exercise.   Working at [employer] part-time   Retired Arts administrator.  Married to [place] in [place] Resource Strain: Low Risk    Difficulty of Paying Living Expenses: Not hard at all  Food Insecurity: No Food Insecurity   Worried About Charity fundraiser in the Last Year: Never true   Arboriculturist in the Last Year: Never true  Transportation Needs: No Transportation Needs   Lack of Transportation (Medical): No   Lack of Transportation (Non-Medical): No  Physical Activity: Sufficiently Active   Days of Exercise per Week: 3 days   Minutes of Exercise per Session: 120 min  Stress: No Stress Concern Present   Feeling of Stress : Not at all  Social Connections: Not on file    Tobacco Counseling Counseling given: Not Answered   Clinical Intake:  Pre-visit preparation completed: Yes  Pain : No/denies  pain     Nutritional Risks: None Diabetes: No  How often do you need to have someone help you when you read instructions, pamphlets, or other written materials from your doctor or pharmacy?: 1 - Never  Diabetic: No Nutrition Risk Assessment:  Has the patient had any N/V/D within the last 2 months?  No  Does the patient have any non-healing wounds?  No  Has the patient had any unintentional weight loss or weight gain?  No   Diabetes:  Is the patient diabetic?  No  If diabetic, was a CBG obtained today?   N/A Did the patient bring in their glucometer from home?   N/A How often do  you monitor your CBG's? N/A.   Financial Strains and Diabetes Management:  Are you having any financial strains with the device, your supplies or your medication?  N/A .  Does the patient want to be seen by Chronic Care Management for management of their diabetes?   N/A Would the patient like to be referred to a Nutritionist or for Diabetic Management?   N/A    Interpreter Needed?: No  Information entered by :: CJohnson, LPN   Activities of Daily Living In your present state of health, do you have any difficulty performing the following activities: 03/13/2021  Hearing? Y  Comment wears hearing aids  Vision? N  Difficulty concentrating or making decisions? N  Walking or climbing stairs? N  Dressing or bathing? N  Doing errands, shopping? N  Preparing Food and eating ? N  Using the Toilet? N  In the past six months, have you accidently leaked urine? N  Do you have problems with loss of bowel control? N  Managing your Medications? N  Managing your Finances? N  Housekeeping or managing your Housekeeping? N  Some recent data might be hidden    Patient Care Team: Tonia Ghent, MD as PCP - General Rockey Situ Kathlene November, MD as Consulting Physician (Cardiology)  Indicate any recent Medical Services you may have received from other than [facility] providers in the past year (date may be  approximate).     Assessment:   This is a routine wellness examination for Sy.  Hearing/Vision screen Vision Screening - Comments:: Patient gets annual eye exams   Dietary issues and exercise activities discussed: Current Exercise Habits: Home exercise routine, Type of exercise: Other - see comments;strength training/weights (gym), Time (Minutes): 60, Frequency (Times/Week): 3, Weekly Exercise (Minutes/Week): 180, Intensity: Moderate, Exercise limited by: None identified   Goals Addressed             This Visit's Progress    Patient Stated       03/13/2021, I will continue to go to the gym 2-3 days a week for 1 1/2- 2 hours.         Depression Screen PHQ 2/9 Scores 03/13/2021 02/11/2019 01/21/2018 12/09/2016 12/02/2016 10/31/2014 10/21/2013  PHQ - 2 Score 0 0 0 0 0 0 0  PHQ- 9 Score 0 0 0 - - - -    Fall Risk Fall Risk  03/13/2021 02/11/2019 01/21/2018 12/09/2016 12/02/2016  Falls in the past year? 1 0 No No No  Number falls in past yr: 0 - - - -  Injury with Fall? 0 - - - -  Risk for fall due to : Medication side effect - - - -  Follow up Falls evaluation completed;Falls prevention discussed - - - -    FALL RISK PREVENTION PERTAINING TO THE HOME:  Any stairs in or around the home? Yes  If so, are there any without handrails? No  Home free of loose throw rugs in walkways, pet beds, electrical cords, etc? Yes  Adequate lighting in your home to reduce risk of falls? Yes   ASSISTIVE DEVICES UTILIZED TO PREVENT FALLS:  Life alert? No  Use of a cane, walker or w/c? No  Grab bars in the bathroom? No  Shower chair or bench in shower? No  Elevated toilet seat or a handicapped toilet? No   TIMED UP AND GO:  Was the test performed?  N/A telephone visit  .   Cognitive Function: MMSE - Mini Mental State Exam 03/13/2021 02/11/2019 01/21/2018 12/02/2016  Orientation to time 5 5 5 5   Orientation to Place 5 5 5 5   Registration 3 3 3 3   Attention/ Calculation 5 0 0 0  Recall 3 3 3 3    Language- name 2 objects - 0 0 0  Language- repeat 1 1 1 1   Language- follow 3 step command - 0 3 3  Language- read & follow direction - 0 0 0  Write a sentence - 0 0 0  Copy design - 0 0 0  Total score - 17 20 20   Mini Cog  Mini-Cog screen was completed. Maximum score is 22. A value of 0 denotes this part of the MMSE was not completed or the patient failed this part of the Mini-Cog screening.       Immunizations Immunization History  Administered Date(s) Administered   Fluad Quad(high Dose 65+) 06/18/2020   Influenza Whole 05/09/2010   Influenza, High Dose Seasonal PF 05/28/2014, 06/01/2017, 05/24/2019   Influenza-Unspecified 07/14/2015, 06/06/2016, 04/30/2018   PFIZER Comirnaty(Gray Top)Covid-19 Tri-Sucrose Vaccine 02/24/2021   PFIZER(Purple Top)SARS-COV-2 Vaccination 09/15/2019, 10/06/2019, 06/18/2020   PNEUMOCOCCAL CONJUGATE-20 02/24/2021   Pneumococcal Conjugate-13 09/08/2014   Pneumococcal Polysaccharide-23 07/07/2007, 07/22/2017   Td 06/27/2004   Tdap 11/02/2016   Zoster Recombinat (Shingrix) 05/23/2018, 08/12/2018   Zoster, Live 07/14/2007    TDAP status: Up to date  Flu Vaccine status: Up to date  Pneumococcal vaccine status: Up to date  Covid-19 vaccine status: Completed vaccines  Qualifies for Shingles Vaccine? Yes   Zostavax completed Yes   Shingrix Completed?: Yes  Screening Tests Health Maintenance  Topic Date Due   INFLUENZA VACCINE  04/08/2021   TETANUS/TDAP  11/02/2026   COVID-19 Vaccine  Completed   PNA vac Low Risk Adult  Completed   Zoster Vaccines- Shingrix  Completed   HPV VACCINES  Aged Out    Health Maintenance  There are no preventive care reminders to display for this patient.  Colorectal cancer screening: No longer required.   Lung Cancer Screening: (Low Dose CT Chest recommended if Age 51-80 years, 30 pack-year currently smoking OR have quit w/in 15years.) does not qualify.    Additional Screening:  Hepatitis C Screening:  does not qualify; Completed N/A  Vision Screening: Recommended annual ophthalmology exams for early detection of glaucoma and other disorders of the eye. Is the patient up to date with their annual eye exam?  Yes  Who is the provider or what is the name of the office in which the patient attends annual eye exams? [facility]  If pt is not established with a provider, would they like to be referred to a provider to establish care? No .   Dental Screening: Recommended annual dental exams for proper oral hygiene  Community Resource Referral / Chronic Care Management: CRR required this visit?  No   CCM required this visit?  No      Plan:     I have personally reviewed and noted the following in the patient's chart:   Medical and social history Use of alcohol, tobacco or illicit drugs  Current medications and supplements including opioid prescriptions. Patient is not currently taking opioid prescriptions. Functional ability and status Nutritional status Physical activity Advanced directives List of other physicians Hospitalizations, surgeries, and ER visits in previous 12 months Vitals Screenings to include cognitive, depression, and falls Referrals and appointments  In addition, I have reviewed and discussed with patient certain preventive protocols, quality metrics, and best practice recommendations. A written personalized care plan for  preventive services as well as general preventive health recommendations were provided to patient.   Due to this being a telephonic visit, the after visit summary with patients personalized plan was offered to patient via office or my-chart. Patient preferred to pick up at office at next visit or via mychart.   Andrez Grime, LPN   04/10/7289

## 2021-03-13 NOTE — Patient Instructions (Signed)
[name] , Thank you for taking time to come for your Medicare Wellness Visit. I appreciate your ongoing commitment to your health goals. Please review the following plan we discussed and let me know if I can assist you in the future.   Screening recommendations/referrals: Colonoscopy: no longer required  Recommended yearly ophthalmology/optometry visit for glaucoma screening and checkup Recommended yearly dental visit for hygiene and checkup  Vaccinations: Influenza vaccine: Up to date, completed 06/18/2020, due 04/2021 Pneumococcal vaccine: Completed series Tdap vaccine: Up to date, completed 11/02/2016, due 10/2026 Shingles vaccine: Completed series   Covid-19: Completed series  Advanced directives: Please bring a copy of your POA (Power of Attorney) and/or Living Will to your next appointment.   Conditions/risks identified: hyperlipidemia   Next appointment: Follow up in one year for your annual wellness visit.   Preventive Care 71 Years and Older, Male Preventive care refers to lifestyle choices and visits with your health care provider that can promote health and wellness. What does preventive care include? A yearly physical exam. This is also called an annual well check. Dental exams once or twice a year. Routine eye exams. Ask your health care provider how often you should have your eyes checked. Personal lifestyle choices, including: Daily care of your teeth and gums. Regular physical activity. Eating a healthy diet. Avoiding tobacco and drug use. Limiting alcohol use. Practicing safe sex. Taking low doses of aspirin every day. Taking vitamin and mineral supplements as recommended by your health care provider. What happens during an annual well check? The services and screenings done by your health care provider during your annual well check will depend on your age, overall health, lifestyle risk factors, and family history of disease. Counseling  Your health care  provider may ask you questions about your: Alcohol use. Tobacco use. Drug use. Emotional well-being. Home and relationship well-being. Sexual activity. Eating habits. History of falls. Memory and ability to understand (cognition). Work and work Statistician. Screening  You may have the following tests or measurements: Height, weight, and BMI. Blood pressure. Lipid and cholesterol levels. These may be checked every 5 years, or more frequently if you are over 22 years old. Skin check. Lung cancer screening. You may have this screening every year starting at age 73 if you have a 30-pack-year history of smoking and currently smoke or have quit within the past 15 years. Fecal occult blood test (FOBT) of the stool. You may have this test every year starting at age 5. Flexible sigmoidoscopy or colonoscopy. You may have a sigmoidoscopy every 5 years or a colonoscopy every 10 years starting at age 38. Prostate cancer screening. Recommendations will vary depending on your family history and other risks. Hepatitis C blood test. Hepatitis B blood test. Sexually transmitted disease (STD) testing. Diabetes screening. This is done by checking your blood sugar (glucose) after you have not eaten for a while (fasting). You may have this done every 1-3 years. Abdominal aortic aneurysm (AAA) screening. You may need this if you are a current or former smoker. Osteoporosis. You may be screened starting at age 66 if you are at high risk. Talk with your health care provider about your test results, treatment options, and if necessary, the need for more tests. Vaccines  Your health care provider may recommend certain vaccines, such as: Influenza vaccine. This is recommended every year. Tetanus, diphtheria, and acellular pertussis (Tdap, Td) vaccine. You may need a Td booster every 10 years. Zoster vaccine. You may need this after age  60. Pneumococcal 13-valent conjugate (PCV13) vaccine. One dose is  recommended after age 107. Pneumococcal polysaccharide (PPSV23) vaccine. One dose is recommended after age 52. Talk to your health care provider about which screenings and vaccines you need and how often you need them. This information is not intended to replace advice given to you by your health care provider. Make sure you discuss any questions you have with your health care provider. Document Released: 09/21/2015 Document Revised: 05/14/2016 Document Reviewed: 06/26/2015 Elsevier Interactive Patient Education  2017 [place] Prevention in the Home Falls can cause injuries. They can happen to people of all ages. There are many things you can do to make your home safe and to help prevent falls. What can I do on the outside of my home? Regularly fix the edges of walkways and driveways and fix any cracks. Remove anything that might make you trip as you walk through a door, such as a raised step or threshold. Trim any bushes or trees on the path to your home. Use bright outdoor lighting. Clear any walking paths of anything that might make someone trip, such as rocks or tools. Regularly check to see if handrails are loose or broken. Make sure that both sides of any steps have handrails. Any raised decks and porches should have guardrails on the edges. Have any leaves, snow, or ice cleared regularly. Use sand or salt on walking paths during winter. Clean up any spills in your garage right away. This includes oil or grease spills. What can I do in the bathroom? Use night lights. Install grab bars by the toilet and in the tub and shower. Do not use towel bars as grab bars. Use non-skid mats or decals in the tub or shower. If you need to sit down in the shower, use a plastic, non-slip stool. Keep the floor dry. Clean up any water that spills on the floor as soon as it happens. Remove soap buildup in the tub or shower regularly. Attach bath mats securely with double-sided non-slip rug  tape. Do not have throw rugs and other things on the floor that can make you trip. What can I do in the bedroom? Use night lights. Make sure that you have a light by your bed that is easy to reach. Do not use any sheets or blankets that are too big for your bed. They should not hang down onto the floor. Have a firm chair that has side arms. You can use this for support while you get dressed. Do not have throw rugs and other things on the floor that can make you trip. What can I do in the kitchen? Clean up any spills right away. Avoid walking on wet floors. Keep items that you use a lot in easy-to-reach places. If you need to reach something above you, use a strong step stool that has a grab bar. Keep electrical cords out of the way. Do not use floor polish or wax that makes floors slippery. If you must use wax, use non-skid floor wax. Do not have throw rugs and other things on the floor that can make you trip. What can I do with my stairs? Do not leave any items on the stairs. Make sure that there are handrails on both sides of the stairs and use them. Fix handrails that are broken or loose. Make sure that handrails are as long as the stairways. Check any carpeting to make sure that it is firmly attached to the stairs. Fix  any carpet that is loose or worn. Avoid having throw rugs at the top or bottom of the stairs. If you do have throw rugs, attach them to the floor with carpet tape. Make sure that you have a light switch at the top of the stairs and the bottom of the stairs. If you do not have them, ask someone to add them for you. What else can I do to help prevent falls? Wear shoes that: Do not have high heels. Have rubber bottoms. Are comfortable and fit you well. Are closed at the toe. Do not wear sandals. If you use a stepladder: Make sure that it is fully opened. Do not climb a closed stepladder. Make sure that both sides of the stepladder are locked into place. Ask someone to  hold it for you, if possible. Clearly mark and make sure that you can see: Any grab bars or handrails. First and last steps. Where the edge of each step is. Use tools that help you move around (mobility aids) if they are needed. These include: Canes. Walkers. Scooters. Crutches. Turn on the lights when you go into a dark area. Replace any light bulbs as soon as they burn out. Set up your furniture so you have a clear path. Avoid moving your furniture around. If any of your floors are uneven, fix them. If there are any pets around you, be aware of where they are. Review your medicines with your doctor. Some medicines can make you feel dizzy. This can increase your chance of falling. Ask your doctor what other things that you can do to help prevent falls. This information is not intended to replace advice given to you by your health care provider. Make sure you discuss any questions you have with your health care provider. Document Released: 06/21/2009 Document Revised: 01/31/2016 Document Reviewed: 09/29/2014 Elsevier Interactive Patient Education  2017 [employer].

## 2021-03-13 NOTE — Progress Notes (Signed)
PCP notes:  Health Maintenance: No gaps noted   Abnormal Screenings: none   Patient concerns: none   Nurse concerns: none   Next PCP appt. 03/28/2021 @ 10:30 am

## 2021-03-15 ENCOUNTER — Other Ambulatory Visit: Payer: Self-pay | Admitting: Family Medicine

## 2021-03-17 ENCOUNTER — Other Ambulatory Visit: Payer: Self-pay | Admitting: Family Medicine

## 2021-03-17 DIAGNOSIS — E7800 Pure hypercholesterolemia, unspecified: Secondary | ICD-10-CM

## 2021-03-25 ENCOUNTER — Other Ambulatory Visit: Payer: Self-pay

## 2021-03-25 ENCOUNTER — Other Ambulatory Visit (INDEPENDENT_AMBULATORY_CARE_PROVIDER_SITE_OTHER): Payer: Medicare Other

## 2021-03-25 DIAGNOSIS — E7800 Pure hypercholesterolemia, unspecified: Secondary | ICD-10-CM | POA: Diagnosis not present

## 2021-03-25 LAB — COMPREHENSIVE METABOLIC PANEL
ALT: 18 U/L (ref 0–53)
AST: 21 U/L (ref 0–37)
Albumin: 4.2 g/dL (ref 3.5–5.2)
Alkaline Phosphatase: 52 U/L (ref 39–117)
BUN: 15 mg/dL (ref 6–23)
CO2: 27 mEq/L (ref 19–32)
Calcium: 9.1 mg/dL (ref 8.4–10.5)
Chloride: 100 mEq/L (ref 96–112)
Creatinine, Ser: 0.72 mg/dL (ref 0.40–1.50)
GFR: 83.72 mL/min (ref >60.00)
Glucose, Bld: 97 mg/dL (ref 70–99)
Potassium: 4.6 mEq/L (ref 3.5–5.1)
Sodium: 133 mEq/L — ABNORMAL LOW (ref 135–145)
Total Bilirubin: 0.8 mg/dL (ref 0.2–1.2)
Total Protein: 6.6 g/dL (ref 6.0–8.3)

## 2021-03-25 LAB — LIPID PANEL
Cholesterol: 115 mg/dL (ref 0–200)
HDL: 43.3 mg/dL (ref >39.00)
LDL Cholesterol: 60 mg/dL (ref 0–99)
NonHDL: 71.23
Total CHOL/HDL Ratio: 3
Triglycerides: 55 mg/dL (ref 0.0–149.0)
VLDL: 11 mg/dL (ref 0.0–40.0)

## 2021-03-28 ENCOUNTER — Other Ambulatory Visit: Payer: Self-pay

## 2021-03-28 ENCOUNTER — Encounter: Payer: Medicare Other | Admitting: Family Medicine

## 2021-03-28 ENCOUNTER — Ambulatory Visit (INDEPENDENT_AMBULATORY_CARE_PROVIDER_SITE_OTHER): Payer: Medicare Other | Admitting: Family Medicine

## 2021-03-28 ENCOUNTER — Encounter: Payer: Self-pay | Admitting: Family Medicine

## 2021-03-28 VITALS — BP 128/70 | HR 60 | Temp 97.0°F | Ht 67.0 in | Wt 165.0 lb

## 2021-03-28 DIAGNOSIS — I25118 Atherosclerotic heart disease of native coronary artery with other forms of angina pectoris: Secondary | ICD-10-CM

## 2021-03-28 DIAGNOSIS — I10 Essential (primary) hypertension: Secondary | ICD-10-CM | POA: Diagnosis not present

## 2021-03-28 DIAGNOSIS — Z7189 Other specified counseling: Secondary | ICD-10-CM

## 2021-03-28 DIAGNOSIS — K2270 Barrett's esophagus without dysplasia: Secondary | ICD-10-CM

## 2021-03-28 DIAGNOSIS — E7800 Pure hypercholesterolemia, unspecified: Secondary | ICD-10-CM | POA: Diagnosis not present

## 2021-03-28 DIAGNOSIS — Z0000 Encounter for general adult medical examination without abnormal findings: Secondary | ICD-10-CM

## 2021-03-28 MED ORDER — OMEPRAZOLE 20 MG PO CPDR: 20.0000 mg | DELAYED_RELEASE_CAPSULE | ORAL | Status: DC

## 2021-03-28 MED ORDER — OMEPRAZOLE 20 MG PO CPDR: 20.0000 mg | DELAYED_RELEASE_CAPSULE | ORAL | 3 refills | Status: DC

## 2021-03-28 MED ORDER — METOPROLOL TARTRATE 25 MG PO TABS: ORAL_TABLET | ORAL | 3 refills | Status: DC

## 2021-03-28 MED ORDER — ROSUVASTATIN CALCIUM 20 MG PO TABS: 20.0000 mg | ORAL_TABLET | Freq: Every day | ORAL | 3 refills | Status: DC

## 2021-03-28 NOTE — Progress Notes (Signed)
This visit occurred during the SARS-CoV-2 public health emergency.  Safety protocols were in place, including screening questions prior to the visit, additional usage of staff PPE, and extensive cleaning of exam room while observing appropriate contact time as indicated for disinfecting solutions.  Advance directive- wife designated if patient were incapacitated.   Defer colonoscopy and PSA given his age.  D/w pt.  He agrees. shingrix prev done. tdap 2018 Flu d/w pt PNA up to date.  covid 2021  Hypertension:   Using medication without problems or lightheadedness:  yes Chest pain with exertion:no Edema:no Short of breath:no Labs d/w pt.    H/o Barrett's on PPI MWF.  No ADE on med.  No GERD sx.  No dysphagia.   Elevated Cholesterol: Using medications without problems: yes Muscle aches: no Diet compliance: yes Exercise: yes  He is going to the gym 2-3 times per week.  We talked about protein intake and getting 1 extra serving per day.    PMH and SH reviewed  Meds, vitals, and allergies reviewed.   ROS: Per HPI unless specifically indicated in ROS section   GEN: nad, alert and oriented HEENT: ncat NECK: supple w/o LA CV: rrr. PULM: ctab, no inc wob ABD: soft, +bs EXT: no edema SKIN: Well-perfused.

## 2021-03-28 NOTE — Patient Instructions (Signed)
Thanks for your effort.  Take care.  Glad to see you. Don't change your med for now.

## 2021-03-31 NOTE — Assessment & Plan Note (Signed)
Continue metoprolol and lisinopril.  Labs discussed with patient.

## 2021-03-31 NOTE — Assessment & Plan Note (Signed)
Advance directive- wife designated if patient were incapacitated.  

## 2021-03-31 NOTE — Assessment & Plan Note (Signed)
Advance directive- wife designated if patient were incapacitated.   Defer colonoscopy and PSA given his age.  D/w pt.  He agrees. shingrix prev done. tdap 2018 Flu d/w pt PNA up to date.  covid 2021

## 2021-03-31 NOTE — Assessment & Plan Note (Signed)
Continue PPI.  He will update me as needed.

## 2021-03-31 NOTE — Assessment & Plan Note (Signed)
Continue Crestor.  Labs discussed with patient. 

## 2021-05-08 DIAGNOSIS — Z23 Encounter for immunization: Secondary | ICD-10-CM | POA: Diagnosis not present

## 2021-06-30 DIAGNOSIS — Z23 Encounter for immunization: Secondary | ICD-10-CM | POA: Diagnosis not present

## 2021-08-29 ENCOUNTER — Telehealth: Payer: Self-pay | Admitting: Family Medicine

## 2021-08-29 NOTE — Chronic Care Management (AMB) (Signed)
°  Chronic Care Management   Outreach Note  08/29/2021 Name: Barry Small MRN: [id number] DOB: March 08, 1934  Referred by: Tonia Ghent, MD Reason for referral : No chief complaint on file.   An unsuccessful telephone outreach was attempted today. The patient was referred to the pharmacist for assistance with care management and care coordination.   Follow Up Plan:   Tatjana Dellinger Upstream Scheduler

## 2021-08-29 NOTE — Progress Notes (Signed)
°  Chronic Care Management   Note  08/29/2021 Name: Barry Small MRN: [id number] DOB: 06-Jan-1934  Barry Small is a 87 y.o. year old male who is a primary care patient of Tonia Ghent, MD. I reached out to Barry Small by phone today in response to a referral sent by Barry Small PCP, Tonia Ghent, MD.   Mr. Paternoster was given information about Chronic Care Management services today including:  CCM service includes personalized support from designated clinical staff supervised by his physician, including individualized plan of care and coordination with other care providers 24/7 contact phone numbers for assistance for urgent and routine care needs. Service will only be billed when office clinical staff spend 20 minutes or more in a month to coordinate care. Only one practitioner may furnish and bill the service in a calendar month. The patient may stop CCM services at any time (effective at the end of the month) by phone call to the office staff.   GAYLE Kresse/ SPOUSE verbally agreed to assistance and services provided by embedded care coordination/care management team today.  Follow up [place]

## 2021-09-13 ENCOUNTER — Ambulatory Visit: Payer: Medicare Other | Admitting: Cardiovascular Disease

## 2021-09-18 ENCOUNTER — Ambulatory Visit (INDEPENDENT_AMBULATORY_CARE_PROVIDER_SITE_OTHER): Payer: Medicare Other | Admitting: Cardiovascular Disease

## 2021-09-18 ENCOUNTER — Other Ambulatory Visit: Payer: Self-pay

## 2021-09-18 ENCOUNTER — Encounter: Payer: Self-pay | Admitting: Cardiovascular Disease

## 2021-09-18 VITALS — BP 140/70 | HR 60 | Ht 67.0 in | Wt 168.0 lb

## 2021-09-18 DIAGNOSIS — I10 Essential (primary) hypertension: Secondary | ICD-10-CM | POA: Diagnosis not present

## 2021-09-18 DIAGNOSIS — Z951 Presence of aortocoronary bypass graft: Secondary | ICD-10-CM

## 2021-09-18 DIAGNOSIS — I25118 Atherosclerotic heart disease of native coronary artery with other forms of angina pectoris: Secondary | ICD-10-CM | POA: Diagnosis not present

## 2021-09-18 DIAGNOSIS — E782 Mixed hyperlipidemia: Secondary | ICD-10-CM

## 2021-09-18 MED ORDER — NITROGLYCERIN 0.4 MG SL SUBL: SUBLINGUAL_TABLET | SUBLINGUAL | 0 refills | Status: DC

## 2021-09-18 NOTE — Patient Instructions (Signed)
Medication Instructions:  No changes  If you need a refill on your cardiac medications before your next appointment, please call your pharmacy.   Lab work: No new labs needed  Testing/Procedures: No new testing needed  Follow-Up: At [facility], you and your health needs are our priority.  As part of our continuing mission to provide you with exceptional heart care, we have created designated Provider Care Teams.  These Care Teams include your primary Cardiologist (physician) and Advanced Practice Providers (APPs -  Physician Assistants and Nurse Practitioners) who all work together to provide you with the care you need, when you need it.  You will need a follow up appointment in 12 months  Providers on your designated Care Team:   Christopher Berge, NP Ryan Dunn, PA-C Cadence Furth, PA-C  COVID-19 Vaccine Information can be found at: [email or web address] For questions related to vaccine distribution or appointments, please email [email or web address] or call [phone].   

## 2021-09-18 NOTE — Progress Notes (Signed)
Cardiology Office Note  Date:  09/18/2021   ID:  Barry Small, DOB Dec 12, 1933, MRN [id number]  PCP:  Tonia Ghent, MD   Chief Complaint  Patient presents with   DOT clearance     "Doing well." Medications reviewed by the patient verbally.     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, 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 Works 7 to 1 pm, Off in the summer Drives the bus for [name] 3 hours a day  Initial blood pressure elevated, improved on recheck by myself  covid sept 2020  EKG personally reviewed by myself on todays visit Normal sinus rhythm rate 60 bpm poor R wave progression to the anterior precordial leads, consider old inferior MI  Previous stress Myoview test 2021  Nuclear stress 11/2017, no significant ischemia Perfusion abnormality of the basal inferior wall to mid to distal region consistent with previous inferior wall MI Inferior wall hypokinesis,  EF estimated at 47% No EKG changes concerning for ischemia at peak stress or in recovery. Resting EKG with old inferior MI   Other past medical history Previous pneumonia in February 2014   His last stress nuclear study was in May 2017. EF was normal with an old inferior wall infarct scar. There was no ischemia.    PMH:   has a past medical history of Barrett's esophagus, Colon polyps (04/2003), Coronary artery disease, Elevated bilirubin, Elevated glucose, GERD  (gastroesophageal reflux disease), Hyperlipidemia, Hypertension, Myocardial infarction ([place]), and Pneumonia (2014).  PSH:    Past Surgical History:  Procedure Laterality Date   BELPHAROPTOSIS REPAIR  08/2018   Cardiolite  12/2001   h/o vessel infarct EF 66%   CATARACT EXTRACTION W/ INTRAOCULAR LENS  IMPLANT, BILATERAL  09/2018   CHOLECYSTECTOMY  09/2001   COLONOSCOPY  11/15/2008   Mild divertics, 5 yrs, Dr. Fuller Plan   COLONOSCOPY W/ POLYPECTOMY  05/09/2003   x multiple, Dr. Vira Agar   CORONARY ANGIOPLASTY  2001   x 2, Dr.Pulsipher   CORONARY ANGIOPLASTY WITH STENT PLACEMENT  12/05/2005   CORONARY ARTERY BYPASS GRAFT  1989   [facility]   ESOPHAGOGASTRODUODENOSCOPY  05/09/2003   Barrett's   ESOPHAGOGASTRODUODENOSCOPY  11/17/08   Barrett's Esoph Erosive Esoph HH (Dr. Fuller Plan)  2 years   EYE SURGERY  09/2018   cataract extraction   [facility]  03/28-03/31/2007   Chest pain   NASAL SINUS SURGERY  06/23/2008   Dr.Clark   NOSE SURGERY  06/23/2008   Dr. Carlis Abbott   Stress Myoview  09/30/2004   Normal EF 70%   Stress Myoview  02/11/2006   No change, c/w 09/2004   Stress Myoview  02/22/2008   Abnormal w/prior inf infarct  No signif ischemia    Current Outpatient Medications  Medication Sig Dispense Refill   aspirin 81 MG EC tablet Take 81 mg by mouth daily.     Cholecalciferol (VITAMIN D3) 2000 units TABS Take by mouth daily.  Coenzyme Q10 (CO Q-10) 100 MG CAPS Take 300 mg by mouth daily.     Cyanocobalamin (VITAMIN B-12) 5000 MCG SUBL Place under the tongue daily.     lisinopril (ZESTRIL) 20 MG tablet TAKE 1 TABLET BY MOUTH EVERY DAY 90 tablet 3   metoprolol tartrate (LOPRESSOR) 25 MG tablet TAKE 0.5 TABLETS BY MOUTH 2 TIMES DAILY. 90 tablet 3   Multiple Vitamins-Minerals (ZINC PO) Take 1,000 mg by mouth daily.     omeprazole (PRILOSEC) 20 MG capsule Take 1 capsule (20 mg total) by mouth every Monday, Wednesday, and Friday. 45 capsule 3   polyethylene glycol powder (GLYCOLAX/MIRALAX) powder Take 17  g by mouth daily as needed for mild constipation.     rosuvastatin (CRESTOR) 20 MG tablet Take 1 tablet (20 mg total) by mouth daily. 90 tablet 3   nitroGLYCERIN (NITROSTAT) 0.4 MG SL tablet DISSOLVE ONE TABLET UNDER TONGUE EVERY 5 MINUTES AS NEEDED FOR CHEST PAIN 25 tablet 0   No current facility-administered medications for this visit.     Allergies:   Atorvastatin, Morphine, and Simvastatin   Social History:  The patient  reports that he has never smoked. He has never used smokeless tobacco. He reports current alcohol use. He reports that he does not use drugs.   Family History:   family history includes Heart disease in his father; Pneumonia in his mother.   Review of Systems: Review of Systems  Constitutional: Negative.   Respiratory: Negative.    Cardiovascular: Negative.   Gastrointestinal: Negative.   Musculoskeletal: Negative.   Neurological: Negative.   Psychiatric/Behavioral: Negative.    All other systems reviewed and are negative.  PHYSICAL EXAM: VS:  BP 140/70    Pulse 60    Ht 5\' 7"  (1.702 m)    Wt 168 lb (76.2 kg)    SpO2 98%    BMI 26.31 kg/m  , BMI Body mass index is 26.31 kg/m. Constitutional:  oriented to person, place, and time. No distress.  HENT:  Head: Grossly normal Eyes:  no discharge. No scleral icterus.  Neck: No JVD, no carotid bruits  Cardiovascular: Regular rate and rhythm, no murmurs appreciated Pulmonary/Chest: Clear to auscultation bilaterally, no wheezes or rails Abdominal: Soft.  no distension.  no tenderness.  Musculoskeletal: Normal range of motion Neurological:  normal muscle tone. Coordination normal. No atrophy Skin: Skin warm and dry Psychiatric: normal affect, pleasant  Recent Labs: 10/26/2020: Hemoglobin 12.3; Platelets 90 03/25/2021: ALT 18; BUN 15; Creatinine, Ser 0.72; Potassium 4.6; Sodium 133    Lipid Panel Lab Results  Component Value Date   CHOL 115 03/25/2021   HDL 43.30 03/25/2021   LDLCALC 60 03/25/2021   TRIG  55.0 03/25/2021    Wt Readings from Last 3 Encounters:  09/18/21 168 lb (76.2 kg)  03/28/21 165 lb (74.8 kg)  10/26/20 170 lb (77.1 kg)     ASSESSMENT AND PLAN:  Essential hypertension - Plan: EKG 12-Lead Elevated today but well controlled at home (938 to 182 systolic) Improved on recheck Continue to monitor at home  Atherosclerosis of native coronary artery without angina pectoris, unspecified whether native or transplanted heart - Old inferior MI Last stress test 2021, no ischemia No angina Ok for CDL permit  HYPERCHOLESTEROLEMIA Cholesterol is at goal on the current lipid regimen. No changes to the medications were made.  CDL renewal Has stress test 2 years No new anginal sx Acceptable risk   Total encounter time more than 25 minutes  Greater than 50%  was spent in counseling and coordination of care with the patient   Orders Placed This Encounter  Procedures   EKG 12-Lead     Signed, Esmond Plants, M.D., Ph.D. 09/18/2021  [place], [place]

## 2021-09-23 ENCOUNTER — Ambulatory Visit: Payer: Medicare Other | Admitting: Cardiovascular Disease

## 2021-09-25 ENCOUNTER — Ambulatory Visit: Payer: Medicare Other | Admitting: Nurse Practitioner

## 2021-10-02 DIAGNOSIS — Z961 Presence of intraocular lens: Secondary | ICD-10-CM | POA: Diagnosis not present

## 2021-10-11 ENCOUNTER — Telehealth: Payer: Self-pay

## 2021-10-11 NOTE — Chronic Care Management (AMB) (Signed)
Chronic Care Management Pharmacy Assistant   Name: Barry Small  MRN: [id number] DOB: Apr 13, 1934  Barry Small is an 88 y.o. year old male who presents for his initial CCM visit with the clinical pharmacist.  Reason for Encounter: Initial Questions   Conditions to be addressed/monitored: HTN and HLD  Recent office visits:  03/28/21-PCP-Graham Duncan,MD-Patient presented for AWV.Discussed vaccines, screenings, labs, no medication changes  Recent consult visits:  09/18/21-Cardiology-Timothy Gollan,MD-Patient presented for follow up coronary artery disease and DOT clearance.EKG-no medication changes  Hospital visits:  None in previous 6 months  Medications: Outpatient Encounter Medications as of 10/11/2021  Medication Sig   aspirin 81 MG EC tablet Take 81 mg by mouth daily.   Cholecalciferol (VITAMIN D3) 2000 units TABS Take by mouth daily.   Coenzyme Q10 (CO Q-10) 100 MG CAPS Take 300 mg by mouth daily.   Cyanocobalamin (VITAMIN B-12) 5000 MCG SUBL Place under the tongue daily.   lisinopril (ZESTRIL) 20 MG tablet TAKE 1 TABLET BY MOUTH EVERY DAY   metoprolol tartrate (LOPRESSOR) 25 MG tablet TAKE 0.5 TABLETS BY MOUTH 2 TIMES DAILY.   Multiple Vitamins-Minerals (ZINC PO) Take 1,000 mg by mouth daily.   nitroGLYCERIN (NITROSTAT) 0.4 MG SL tablet DISSOLVE ONE TABLET UNDER TONGUE EVERY 5 MINUTES AS NEEDED FOR CHEST PAIN   omeprazole (PRILOSEC) 20 MG capsule Take 1 capsule (20 mg total) by mouth every Monday, Wednesday, and Friday.   polyethylene glycol powder (GLYCOLAX/MIRALAX) powder Take 17 g by mouth daily as needed for mild constipation.   rosuvastatin (CRESTOR) 20 MG tablet Take 1 tablet (20 mg total) by mouth daily.   No facility-administered encounter medications on file as of 10/11/2021.    Lab Results  Component Value Date/Time   MICROALBUR 0.3 10/29/2009 08:09 AM   MICROALBUR 0.2 08/30/2008 08:34 AM     BP Readings from Last 3 Encounters:  09/18/21 140/70   03/28/21 128/70  03/13/21 130/60    Patient contacted to review initial questions prior to visit with [name].  Have you seen any other providers since your last visit with PCP? Yes  Cardiology for DOT clearance  Any changes in your medications or health? No  Any side effects from any medications? No  Do you have an symptoms or problems not managed by your medications? No  Any concerns about your health right now? No  Has your provider asked that you check blood pressure, blood sugar, or follow special diet at home? Yes  The patient has a BP monitor at home   Do you get any type of exercise on a regular basis? 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 No  Do you have any problems getting your medications? No  [facility] [place],[facility] very good [facility] the patient  Is there anything that you would like to discuss during the appointment? No   Spoke with patient and reminded them to have all medications, supplements and any blood glucose and blood pressure readings available for review with pharmacist, at their telephone visit on 10/16/21 at 9:00am.  Spke with the wife Barry Small, they will be ready for the call.  Star Rating Drugs:  Medication:  Last Fill: [facility] Rosuvastatin 20mg  08/02/21 90 Lisinopril 20mg  09/25/21 90   Care Gaps: Annual wellness visit in last year? Yes Most Recent BP reading:  140/70  60-P  09/18/21   Marjo Bicker CPP notified  [name], [place] Assistant [phone]  Total time spent for month CPA: 40 min.

## 2021-10-15 NOTE — Progress Notes (Signed)
Chronic Care Management Pharmacy Note  10/16/2021 Name:  Barry Small MRN:  [id number] DOB:  1934/03/09  Summary: -CCM Initial visit: pt endorses compliance with medications, denies issues -Emphasized continued adherence to medications and contacting providers if issues arise  Recommendations/Changes made from today's visit: -No med changes  Plan: -The patient has been provided with contact information for the care management team and has been advised to call with any health related questions or concerns.  -Pharmacist follow up televisit scheduled for 1 year -Annual physical due July 2023; not yet scheduled   Subjective: CHIRSTOPHER Small is an 88 y.o. year old male who is a primary patient of Damita Dunnings, Elveria Rising, MD.  The CCM team was consulted for assistance with disease management and care coordination needs.    Engaged with patient by telephone for initial visit in response to provider referral for pharmacy case management and/or care coordination services.   Consent to Services:  The patient was given the following information about Chronic Care Management services today, agreed to services, and gave verbal consent: 1. CCM service includes personalized support from designated clinical staff supervised by the primary care provider, including individualized plan of care and coordination with other care providers 2. 24/7 contact phone numbers for assistance for urgent and routine care needs. 3. Service will only be billed when office clinical staff spend 20 minutes or more in a month to coordinate care. 4. Only one practitioner may furnish and bill the service in a calendar month. 5.The patient may stop CCM services at any time (effective at the end of the month) by phone call to the office staff. 6. The patient will be responsible for cost sharing (co-pay) of up to 20% of the service fee (after annual deductible is met). Patient agreed to services and consent obtained.  Patient Care  Team: Tonia Ghent, MD as PCP - General Rockey Situ Kathlene November, MD as Consulting Physician (Cardiology) Charlton Haws, [facility] as Pharmacist (Pharmacist)  Recent office visits: 03/28/21-PCP-Graham Duncan,MD-Patient presented for AWV.Discussed vaccines, screenings, labs, no medication changes  Recent consult visits: 09/18/21-Cardiology-Timothy Gollan,MD-Patient presented for follow up coronary artery disease and DOT clearance.EKG-no medication changes  Hospital visits: None in previous 6 months   Objective:  Lab Results  Component Value Date   CREATININE 0.72 03/25/2021   BUN 15 03/25/2021   GFR 83.72 03/25/2021   GFRNONAA >60 10/26/2020   GFRAA 143 08/30/2008   NA 133 (L) 03/25/2021   K 4.6 03/25/2021   CALCIUM 9.1 03/25/2021   CO2 27 03/25/2021   GLUCOSE 97 03/25/2021    Lab Results  Component Value Date/Time   GFR 83.72 03/25/2021 08:44 AM   GFR 102.43 02/27/2020 08:19 AM   MICROALBUR 0.3 10/29/2009 08:09 AM   MICROALBUR 0.2 08/30/2008 08:34 AM    Last diabetic Eye exam: No results found for: HMDIABEYEEXA  Last diabetic Foot exam: No results found for: HMDIABFOOTEX   Lab Results  Component Value Date   CHOL 115 03/25/2021   HDL 43.30 03/25/2021   LDLCALC 60 03/25/2021   TRIG 55.0 03/25/2021   CHOLHDL 3 03/25/2021    Hepatic Function Latest Ref Rng & Units 03/25/2021 10/26/2020 02/27/2020  Total Protein 6.0 - 8.3 g/dL 6.6 6.2(L) 6.7  Albumin 3.5 - 5.2 g/dL 4.2 3.6 4.4  AST 0 - 37 U/L 21 42(H) 20  ALT 0 - 53 U/L _0 Alk Phosphatase 39 - 117 U/L 52 55 55  Total Bilirubin 0.2 - 1.2  mg/dL 0.8 0.8 0.7  Bilirubin, Direct 0.0 - 0.3 mg/dL - - -    Lab Results  Component Value Date/Time   TSH 2.12 01/27/2011 08:49 AM   TSH 2.85 10/29/2009 08:09 AM    CBC Latest Ref Rng & Units 10/26/2020 11/19/2012 01/27/2011  WBC 4.0 - 10.5 K/uL 8.6 15.4(H) 9.4  Hemoglobin 13.0 - 17.0 g/dL 12.3(L) 14.0 15.5  Hematocrit 39.0 - 52.0 % 35.5(L) 41.8 45.3  Platelets 150 - 400  K/uL 90(L) 183.0 158.0    Lab Results  Component Value Date/Time   VD25OH 40 10/29/2009 08:42 PM    Clinical ASCVD: Yes  The ASCVD Risk score (Arnett DK, et al., 2019) failed to calculate for the following reasons:   The 2019 ASCVD risk score is only valid for ages 13 to 17    Depression screen PHQ 2/9 03/13/2021 02/11/2019 01/21/2018  Decreased Interest 0 0 0  Down, Depressed, Hopeless 0 0 0  PHQ - 2 Score 0 0 0  Altered sleeping 0 0 0  Tired, decreased energy 0 0 0  Change in appetite 0 0 0  Feeling bad or failure about yourself  0 0 0  Trouble concentrating 0 0 0  Moving slowly or fidgety/restless 0 0 0  Suicidal thoughts 0 0 0  PHQ-9 Score 0 0 0  Difficult doing work/chores Not difficult at all Not difficult at all Not difficult at all     Social History   Tobacco Use  Smoking Status Never  Smokeless Tobacco Never   BP Readings from Last 3 Encounters:  09/18/21 140/70  03/28/21 128/70  03/13/21 130/60   Pulse Readings from Last 3 Encounters:  09/18/21 60  03/28/21 60  03/13/21 (!) 53   Wt Readings from Last 3 Encounters:  09/18/21 168 lb (76.2 kg)  03/28/21 165 lb (74.8 kg)  10/26/20 170 lb (77.1 kg)   BMI Readings from Last 3 Encounters:  09/18/21 26.31 kg/m  03/28/21 25.84 kg/m  10/26/20 26.63 kg/m    Assessment/Interventions: Review of patient past medical history, allergies, medications, health status, including review of consultants reports, laboratory and other test data, was performed as part of comprehensive evaluation and provision of chronic care management services.   SDOH:  (Social Determinants of Health) assessments and interventions performed: Yes SDOH Interventions    Flowsheet Row Most Recent Value  SDOH Interventions   Food Insecurity Interventions Intervention Not Indicated  Financial Strain Interventions Intervention Not Indicated      SDOH Screenings   Alcohol Screen: Low Risk    Last Alcohol Screening Score (AUDIT): 1   Depression (PHQ2-9): Low Risk    PHQ-2 Score: 0  Financial Resource Strain: Low Risk    Difficulty of Paying Living Expenses: Not hard at all  Food Insecurity: No Food Insecurity   Worried About Charity fundraiser in the Last Year: Never true   Ran Out of Food in the Last Year: Never true  Housing: Low Risk    Last Housing Risk Score: 0  Physical Activity: Sufficiently Active   Days of Exercise per Week: 3 days   Minutes of Exercise per Session: 120 min  Social Connections: Not on file  Stress: No Stress Concern Present   Feeling of Stress : Not at all  Tobacco Use: Low Risk    Smoking Tobacco Use: Never   Smokeless Tobacco Use: Never   Passive Exposure: Not on file  Transportation Needs: No Transportation Needs   Lack of Transportation (Medical): No  Lack of Transportation (Non-Medical): No    CCM Care Plan  Allergies  Allergen Reactions   Atorvastatin Other (See Comments)    REACTION: myalgia   Morphine Other (See Comments)    REACTION: syncope- lower BP but may be able to tolerate low dose of med.    Simvastatin Other (See Comments)    REACTION: ? reaction    Medications Reviewed Today     Reviewed by Anselm Pancoast, CMA (Certified Medical Assistant) on 09/18/21 at [phone]  Med List Status: <None>   Medication Order Taking? Sig Documenting Provider Last Dose Status Informant  aspirin 81 MG EC tablet [id number] Yes Take 81 mg by mouth daily. [provider] Taking Active   Cholecalciferol (VITAMIN D3) 2000 units TABS [id number] Yes Take by mouth daily. [provider] Taking Active   Coenzyme Q10 (CO Q-10) 100 MG CAPS [id number] Yes Take 300 mg by mouth daily. [provider] Taking Active   Cyanocobalamin (VITAMIN B-12) 5000 MCG SUBL [id number] Yes Place under the tongue daily. [provider] Taking Active   lisinopril (ZESTRIL) 20 MG tablet [id number] Yes TAKE 1 TABLET BY MOUTH EVERY DAY Tonia Ghent, MD Taking Active    metoprolol tartrate (LOPRESSOR) 25 MG tablet [id number] Yes TAKE 0.5 TABLETS BY MOUTH 2 TIMES DAILY. Tonia Ghent, MD Taking Active   Multiple Vitamins-Minerals (ZINC PO) [id number] Yes Take 1,000 mg by mouth daily. [provider] Taking Active   nitroGLYCERIN (NITROSTAT) 0.4 MG SL tablet [id number] Yes DISSOLVE ONE TABLET UNDER TONGUE EVERY 5 MINUTES AS NEEDED FOR CHEST PAIN Tonia Ghent, MD Taking Active   omeprazole (PRILOSEC) 20 MG capsule [id number] Yes Take 1 capsule (20 mg total) by mouth every Monday, Wednesday, and Friday. Tonia Ghent, MD Taking Active   polyethylene glycol powder [facility]) powder [id number] Yes Take 17 g by mouth daily as needed for mild constipation. Tonia Ghent, MD Taking Active   rosuvastatin (CRESTOR) 20 MG tablet [id number] Yes Take 1 tablet (20 mg total) by mouth daily. Tonia Ghent, MD Taking Active             Patient Active Problem List   Diagnosis Date Noted   Atherosclerosis of native coronary artery of native heart with stable angina pectoris ([place]) 10/03/2018   Constipation 08/18/2018   Health care maintenance 01/28/2018   Hx of CABG 06/16/2016   Advance care planning 11/01/2014   Tremor 10/21/2013   Medicare annual wellness visit, subsequent 07/30/2012   BARRETT'S ESOPHAGUS 11/01/2008   CHRONIC RHINITIS 05/10/2008   SHOULDER PAIN, RIGHT 05/28/2007   HYPERCHOLESTEROLEMIA 05/19/2007   GILBERT'S SYNDROME 05/19/2007   Essential hypertension 05/19/2007   Coronary atherosclerosis 05/19/2007   GERD 05/19/2007   HYPERGLYCEMIA 05/19/2007    Immunization History  Administered Date(s) Administered   Fluad Quad(high Dose 65+) 06/18/2020   Influenza Whole 05/09/2010   Influenza, High Dose Seasonal PF 05/28/2014, 06/01/2017, 05/24/2019, 05/08/2021   Influenza-Unspecified 07/14/2015, 06/06/2016, 04/30/2018   PFIZER Comirnaty(Gray Top)Covid-19 Tri-Sucrose Vaccine 02/24/2021   PFIZER(Purple Top)SARS-COV-2 Vaccination  09/15/2019, 10/06/2019, 06/18/2020   PNEUMOCOCCAL CONJUGATE-20 02/24/2021   Pfizer Covid-19 Vaccine Bivalent Booster 41yr & up 06/30/2021   Pneumococcal Conjugate-13 09/08/2014   Pneumococcal Polysaccharide-23 07/07/2007, 07/22/2017   Td 06/27/2004   Tdap 11/02/2016   Zoster Recombinat (Shingrix) 05/23/2018, 08/12/2018   Zoster, Live 07/14/2007    Conditions to be addressed/monitored:  Hypertension, Hyperlipidemia, Coronary Artery Disease, and GERD  Care Plan : [place] Up[facility]  Cleaster Corin, RPH since 10/16/2021 12:00 AM     Problem: Hypertension, Hyperlipidemia, Coronary Artery Disease, and GERD   Priority: High     Long-Range Goal: Disease mgmt   Start Date: 10/16/2021  Expected End Date: 10/16/2022  This Visit's Progress: On track  Priority: High  Note:   Current Barriers:  Unable to independently monitor therapeutic efficacy  Pharmacist Clinical Goal(s):  Patient will achieve adherence to monitoring guidelines and medication adherence to achieve therapeutic efficacy through collaboration with PharmD and provider.   Interventions: 1:1 collaboration with Tonia Ghent, MD regarding development and update of comprehensive plan of care as evidenced by provider attestation and co-signature Inter-disciplinary care team collaboration (see longitudinal plan of care) Comprehensive medication review performed; medication list updated in electronic medical record  Hypertension (BP goal <140/90) -Controlled - BP at goal in recent office visits; pt checks BP at home occasionally and reports it is at goal -Current home readings: unavailable; Denies hypotensive/hypertensive symptoms -Current treatment: Lisinopril 20 mg daily - Appropriate, Effective, Safe, Accessible Metoprolol tartrate 25 mg - 1/2 tab BID - Appropriate, Effective, Safe, Accessible -Educated on BP goals and benefits of medications for prevention of heart attack, stroke and kidney damage;  Importance of home blood pressure monitoring; Symptoms of hypotension and importance of maintaining adequate hydration; -Counseled to monitor BP at home periodically -Recommended to continue current medication  Hyperlipidemia: (LDL goal < 70) -Controlled - LDL is at goal; pt endorses compliance with medications; pt denies side effects with statin but started taking CoQ10 years ago after reading about benefits on the internet -Hx CABG 1989, stenting 2007 -Current treatment: Aspirin 81 mg daily - Appropriate, Effective, Safe, Accessible Rosuvastatin 20 mg daily -Appropriate, Effective, Safe, Accessible Nitroglycerin 0.4 mg SL prn - never used Coenzyme Q10 100 mg daily - Query appropriate -Medications previously tried: pravastatin  -Educated on Cholesterol goals; Benefits of statin for ASCVD risk reduction; -Discussed CoQ10 is typically used for statin-induced myalgias; pt does not recall history of this; other benefits are limited, pt can consider stopping if he wants but no harm to continue -Recommended to continue current medication  GERD (Goal: manage symptoms) -Controlled - per pt report -Current treatment  Omeprazole 20 mg MWF -Recommended to continue current medication  Health Maintenance -Vaccine gaps: none -Current therapy:  Vitamin D 2000 IU daily Vitamin B12 5000 mcg Multivitamin Zinc 50 mg daily  Miralax -Patient is satisfied with current therapy and denies issues; can consider stopping zinc if he wants to cut down on OTCs, no harm to continue -Recommended to continue current medication  Patient Goals/Self-Care Activities Patient will:  - take medications as prescribed as evidenced by patient report and record review focus on medication adherence by routine check blood pressure periodically, document, and provide at future appointments      Medication Assistance: None required.  Patient affirms current coverage meets needs.  Compliance/Adherence/Medication fill  history: Care Gaps: None  Star-Rating Drugs: Lisinopril - LF 09/25/21 x 90 ds; PDC 92% Rosuvastatin - LF 08/02/21 x 90 ds; [place] 100%  Patient's preferred pharmacy is:  [facility] [place], [place] [address] [place] [address] Phone: [phone] Fax: [phone]  Uses pill box? Yes Pt endorses 100% compliance  We discussed: Current pharmacy is preferred with insurance plan and patient is satisfied with pharmacy services Patient decided to: Continue current medication management strategy  Care Plan and Follow Up Patient Decision:  Patient agrees to Care Plan and Follow-up.  Plan: Telephone follow up appointment with care management team member scheduled for:  1 year The patient has been provided with contact information for the care management team and has been advised to call with any health related questions or concerns.   [name], PharmD, BCACP Clinical Pharmacist [facility] at [facility] [phone]

## 2021-10-16 ENCOUNTER — Other Ambulatory Visit: Payer: Self-pay

## 2021-10-16 ENCOUNTER — Ambulatory Visit (INDEPENDENT_AMBULATORY_CARE_PROVIDER_SITE_OTHER): Payer: Medicare Other | Admitting: Pharmacist

## 2021-10-16 DIAGNOSIS — K219 Gastro-esophageal reflux disease without esophagitis: Secondary | ICD-10-CM

## 2021-10-16 DIAGNOSIS — I25118 Atherosclerotic heart disease of native coronary artery with other forms of angina pectoris: Secondary | ICD-10-CM

## 2021-10-16 DIAGNOSIS — I10 Essential (primary) hypertension: Secondary | ICD-10-CM

## 2021-10-16 DIAGNOSIS — E7800 Pure hypercholesterolemia, unspecified: Secondary | ICD-10-CM

## 2021-10-16 NOTE — Patient Instructions (Signed)
Visit Information  Phone number for Pharmacist: [phone]  Thank you for meeting with me to discuss your medications! I look forward to working with you to achieve your health care goals. Below is a summary of what we talked about during the visit:   Goals Addressed             This Visit's Progress    Manage My Medicine       Timeframe:  Long-Range Goal Priority:  Medium Start Date:  10/16/21                           Expected End Date:  10/16/22                     Follow Up Date Feb 2024   - call for medicine refill 2 or 3 days before it runs out - call if I am sick and can't take my medicine - keep a list of all the medicines I take; vitamins and herbals too    Why is this important?   These steps will help you keep on track with your medicines.   Notes:         Care Plan : CCM Pharmacy Care Plan  Updates made by Charlton Haws, RPH since 10/16/2021 12:00 AM     Problem: Hypertension, Hyperlipidemia, Coronary Artery Disease, and GERD   Priority: High     Long-Range Goal: Disease mgmt   Start Date: 10/16/2021  Expected End Date: 10/16/2022  This Visit's Progress: On track  Priority: High  Note:   Current Barriers:  Unable to independently monitor therapeutic efficacy  Pharmacist Clinical Goal(s):  Patient will achieve adherence to monitoring guidelines and medication adherence to achieve therapeutic efficacy through collaboration with PharmD and provider.   Interventions: 1:1 collaboration with Tonia Ghent, MD regarding development and update of comprehensive plan of care as evidenced by provider attestation and co-signature Inter-disciplinary care team collaboration (see longitudinal plan of care) Comprehensive medication review performed; medication list updated in electronic medical record  Hypertension (BP goal <140/90) -Controlled - BP at goal in recent office visits; pt checks BP at home occasionally and reports it is at goal -Current home  readings: unavailable; Denies hypotensive/hypertensive symptoms -Current treatment: Lisinopril 20 mg daily - Appropriate, Effective, Safe, Accessible Metoprolol tartrate 25 mg - 1/2 tab BID - Appropriate, Effective, Safe, Accessible -Educated on BP goals and benefits of medications for prevention of heart attack, stroke and kidney damage; Importance of home blood pressure monitoring; Symptoms of hypotension and importance of maintaining adequate hydration; -Counseled to monitor BP at home periodically -Recommended to continue current medication  Hyperlipidemia: (LDL goal < 70) -Controlled - LDL is at goal; pt endorses compliance with medications; pt denies side effects with statin but started taking CoQ10 years ago after reading about benefits on the internet -Hx CABG 1989, stenting 2007 -Current treatment: Aspirin 81 mg daily - Appropriate, Effective, Safe, Accessible Rosuvastatin 20 mg daily -Appropriate, Effective, Safe, Accessible Nitroglycerin 0.4 mg SL prn - never used Coenzyme Q10 100 mg daily - Query appropriate -Medications previously tried: pravastatin  -Educated on Cholesterol goals; Benefits of statin for ASCVD risk reduction; -Discussed CoQ10 is typically used for statin-induced myalgias; pt does not recall history of this; other benefits are limited, pt can consider stopping if he wants but no harm to continue -Recommended to continue current medication  GERD (Goal: manage symptoms) -Controlled - per pt report -Current  treatment  Omeprazole 20 mg MWF -Recommended to continue current medication  Health Maintenance -Vaccine gaps: none -Current therapy:  Vitamin D 2000 IU daily Vitamin B12 5000 mcg Multivitamin Zinc 50 mg daily  Miralax -Patient is satisfied with current therapy and denies issues; can consider stopping zinc if he wants to cut down on OTCs, no harm to continue -Recommended to continue current medication  Patient Goals/Self-Care Activities Patient  will:  - take medications as prescribed as evidenced by patient report and record review focus on medication adherence by routine check blood pressure periodically, document, and provide at future appointments      Mr. Kader was given information about Chronic Care Management services today including:  CCM service includes personalized support from designated clinical staff supervised by his physician, including individualized plan of care and coordination with other care providers 24/7 contact phone numbers for assistance for urgent and routine care needs. Standard insurance, coinsurance, copays and deductibles apply for chronic care management only during months in which we provide at least 20 minutes of these services. Most insurances cover these services at 100%, however patients may be responsible for any copay, coinsurance and/or deductible if applicable. This service may help you avoid the need for more expensive face-to-face services. Only one practitioner may furnish and bill the service in a calendar month. The patient may stop CCM services at any time (effective at the end of the month) by phone call to the office staff.  Patient agreed to services and verbal consent obtained.   Patient verbalizes understanding of instructions and care plan provided today and agrees to view in [place]. Active MyChart status confirmed with patient.   Telephone follow up appointment with pharmacy team member scheduled for: 1 year  [name], PharmD, [facility] Clinical Pharmacist [place] [phone]

## 2021-11-05 DIAGNOSIS — I10 Essential (primary) hypertension: Secondary | ICD-10-CM

## 2021-11-05 DIAGNOSIS — E7800 Pure hypercholesterolemia, unspecified: Secondary | ICD-10-CM

## 2021-12-15 IMAGING — DX DG CHEST 1V PORT
1 series · 1 of 1 positions shown · non-contrast
Comparison: November 19, 2012.

CLINICAL DATA: Sepsis.

EXAM:
PORTABLE CHEST 1 VIEW

[chest ap]
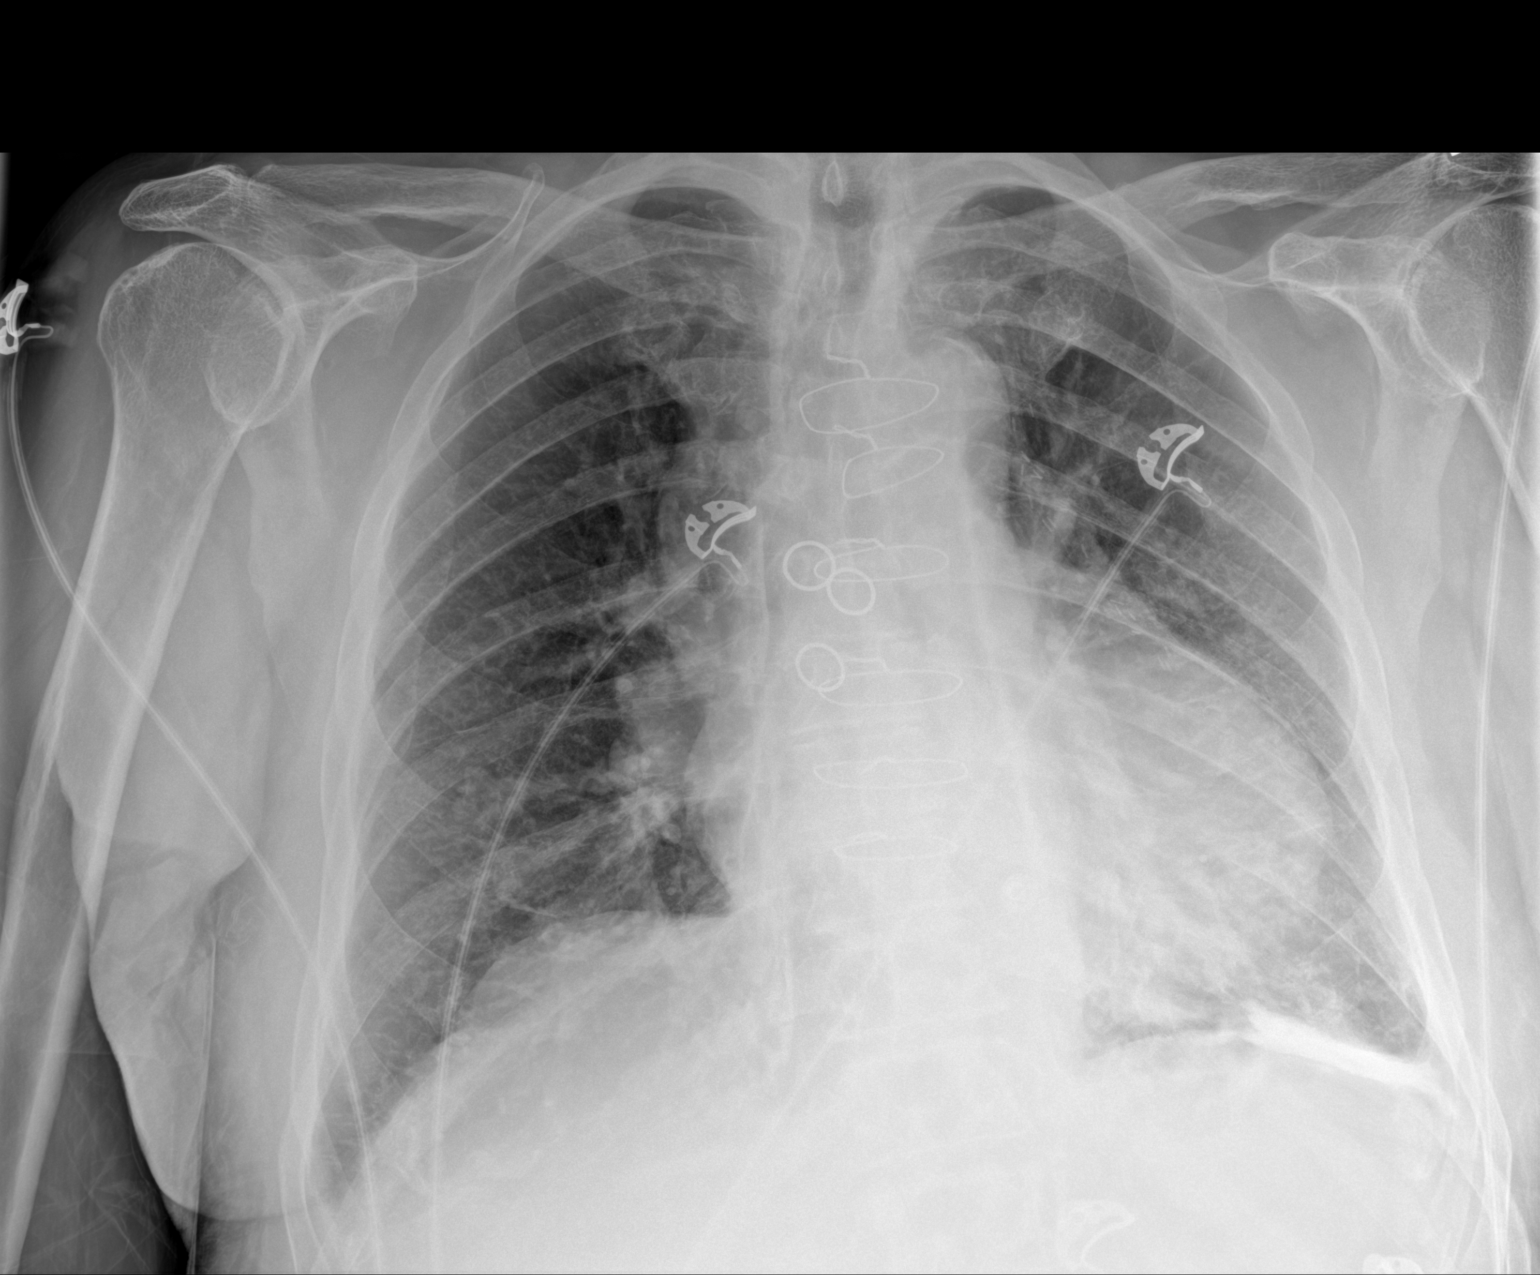

[1 of 1 positions shown; findings below may reference images not displayed]

FINDINGS: Stable cardiomegaly. Status post coronary bypass graft. No
pneumothorax is noted. No significant pleural effusion is noted.
Probable calcified pleural plaque seen in left lung base. Right lung
is clear. Bony thorax is unremarkable.
IMPRESSION: No acute cardiopulmonary abnormality seen.

## 2022-03-14 ENCOUNTER — Ambulatory Visit (INDEPENDENT_AMBULATORY_CARE_PROVIDER_SITE_OTHER): Payer: Medicare Other

## 2022-03-14 VITALS — Wt 168.0 lb

## 2022-03-14 DIAGNOSIS — Z0000 Encounter for general adult medical examination without abnormal findings: Secondary | ICD-10-CM

## 2022-03-14 NOTE — Progress Notes (Signed)
Virtual Visit via Telephone Note  I connected with  Barry Small on 03/14/22 at  8:45 AM EDT by telephone and verified that I am speaking with the correct person using two identifiers.  Location: Patient: home Provider: work Persons participating in the virtual visit: Teacher, English as a foreign language   I discussed the limitations, risks, security and privacy concerns of performing an evaluation and management service by telephone and the availability of in person appointments. The patient expressed understanding and agreed to proceed.  Interactive audio and video telecommunications were attempted between this nurse and patient, however failed, due to patient having technical difficulties OR patient did not have access to video capability.  We continued and completed visit with audio only.  Some vital signs may be absent or patient reported.   Dionisio David, LPN  Subjective:   Barry Small is a 88 y.o. male who presents for Medicare Annual/Subsequent preventive examination.  Review of Systems     Cardiac Risk Factors include: advanced age (>76mn, >>74women);male gender     Objective:    There were no vitals filed for this visit. There is no height or weight on file to calculate BMI.     03/14/2022    8:32 AM 03/13/2021    8:39 AM 10/26/2020    3:23 PM 02/11/2019    8:49 AM 01/21/2018    2:25 PM 12/02/2016    1:25 PM  Advanced Directives  Does Patient Have a Medical Advance Directive? No Yes No Yes Yes Yes  Type of ASocial research officer, governmentLiving will  [place] will [place]?  No - copy requested  No - copy requested No - copy requested No - copy requested  Would patient like information on creating a medical advance directive? No - Patient declined  No - Patient declined       Current Medications  (verified) Outpatient Encounter Medications as of 03/14/2022  Medication Sig   aspirin 81 MG EC tablet Take 81 mg by mouth daily.   Cholecalciferol (VITAMIN D3) 2000 units TABS Take by mouth daily.   Coenzyme Q10 (CO Q-10) 100 MG CAPS Take 300 mg by mouth daily.   Cyanocobalamin (VITAMIN B-12) 5000 MCG SUBL Place under the tongue daily.   lisinopril (ZESTRIL) 20 MG tablet TAKE 1 TABLET BY MOUTH EVERY DAY   metoprolol tartrate (LOPRESSOR) 25 MG tablet TAKE 0.5 TABLETS BY MOUTH 2 TIMES DAILY.   Multiple Vitamins-Minerals (ZINC PO) Take 1,000 mg by mouth daily.   nitroGLYCERIN (NITROSTAT) 0.4 MG SL tablet DISSOLVE ONE TABLET UNDER TONGUE EVERY 5 MINUTES AS NEEDED FOR CHEST PAIN   omeprazole (PRILOSEC) 20 MG capsule Take 1 capsule (20 mg total) by mouth every Monday, Wednesday, and Friday.   polyethylene glycol powder (GLYCOLAX/MIRALAX) powder Take 17 g by mouth daily as needed for mild constipation.   rosuvastatin (CRESTOR) 20 MG tablet Take 1 tablet (20 mg total) by mouth daily.   zinc gluconate 50 MG tablet Take 50 mg by mouth daily.   No facility-administered encounter medications on file as of 03/14/2022.    Allergies (verified) Atorvastatin, Morphine, and Simvastatin   History: Past Medical History:  Diagnosis Date   Barrett's esophagus    Colon polyps 04/2003   type unknown    Coronary artery disease    Elevated bilirubin    Elevated glucose    GERD (gastroesophageal  reflux disease)    Hyperlipidemia    Hypertension    Myocardial infarction [facility])    Pneumonia 2014   Past Surgical History:  Procedure Laterality Date   BELPHAROPTOSIS REPAIR  08/2018   Cardiolite  12/2001   h/o vessel infarct EF 66%   CATARACT EXTRACTION W/ INTRAOCULAR LENS  IMPLANT, BILATERAL  09/2018   CHOLECYSTECTOMY  09/2001   COLONOSCOPY  11/15/2008   Mild divertics, 5 yrs, Dr. Fuller Plan   COLONOSCOPY W/ POLYPECTOMY  05/09/2003   x multiple, Dr. Vira Agar   CORONARY ANGIOPLASTY  2001   x 2, Dr.Pulsipher    CORONARY ANGIOPLASTY WITH STENT PLACEMENT  12/05/2005   CORONARY ARTERY BYPASS GRAFT  1989   [facility]   ESOPHAGOGASTRODUODENOSCOPY  05/09/2003   Barrett's   ESOPHAGOGASTRODUODENOSCOPY  11/17/08   Barrett's Esoph Erosive Esoph [facility] (Dr. Fuller Plan)  2 years   EYE SURGERY  09/2018   cataract extraction   [facility]  03/28-03/31/2007   Chest pain   NASAL SINUS SURGERY  06/23/2008   Dr.Clark   NOSE SURGERY  06/23/2008   Dr. Carlis Abbott   Stress Myoview  09/30/2004   Normal EF 70%   Stress Myoview  02/11/2006   No change, c/w 09/2004   Stress Myoview  02/22/2008   Abnormal w/prior inf infarct  No signif ischemia   Family History  Problem Relation Age of Onset   Heart disease Father    Pneumonia Mother    Colon cancer Neg Hx    Prostate cancer Neg Hx    Social History   Socioeconomic History   Marital status: Married    Spouse name: Not on file   Number of children: 2   Years of education: Not on file   Highest education level: Not on file  Occupational History   Occupation: Retired Scientist, water quality: [employer]   Occupation: Part time    Employer: [employer]  Tobacco Use   Smoking status: Never   Smokeless tobacco: Never  Vaping Use   Vaping Use: Never used  Substance and Sexual Activity   Alcohol use: Yes    Comment: occassional beer-socially   Drug use: No   Sexual activity: Not Currently  Other Topics Concern   Not on file  Social History Narrative   Caffeine use:  1   Working at [employer] part-time   Retired Arts administrator.  Married to [place] in [place] Strain: Low Risk  (03/14/2022)   Overall Financial Resource Strain (CARDIA)    Difficulty of Paying Living Expenses: Not hard at all  Food Insecurity: No Food Insecurity (03/14/2022)   Hunger Vital Sign    Worried About Running Out of Food in the Last Year: Never true    Ran Out of Food in the Last Year: Never true  Transportation Needs: No Transportation Needs (03/14/2022)    PRAPARE - Hydrologist (Medical): No    Lack of Transportation (Non-Medical): No  Physical Activity: Sufficiently Active (03/14/2022)   Exercise Vital Sign    Days of Exercise per Week: 3 days    Minutes of Exercise per Session: 60 min  Stress: No Stress Concern Present (03/14/2022)   [place]    Feeling of Stress : Not at all  Social Connections: Moderately Integrated (03/14/2022)   Social Connection and Isolation Panel [NHANES]    Frequency of Communication with Friends and  Family: More than three times a week    Frequency of Social Gatherings with Friends and Family: More than three times a week    Attends Religious Services: More than 4 times per year    Active Member of [employer] or Organizations: No    Attends Music therapist: Never    Marital Status: Married    Tobacco Counseling Counseling given: Not Answered   Clinical Intake:  Pre-visit preparation completed: Yes  Pain : No/denies pain     Nutritional Risks: None Diabetes: No  How often do you need to have someone help you when you read instructions, pamphlets, or other written materials from your doctor or pharmacy?: 1 - Never  Diabetic?no  Interpreter Needed?: No  Information entered by :: Kirke Shaggy, LPN   Activities of Daily Living    03/14/2022    8:33 AM  In your present state of health, do you have any difficulty performing the following activities:  Hearing? 1  Vision? 0  Difficulty concentrating or making decisions? 0  Walking or climbing stairs? 0  Dressing or bathing? 0  Doing errands, shopping? 0  Preparing Food and eating ? N  Using the Toilet? N  In the past six months, have you accidently leaked urine? N  Do you have problems with loss of bowel control? N  Managing your Medications? N  Managing your Finances? N  Housekeeping or managing your Housekeeping? N    Patient Care  Team: Tonia Ghent, MD as PCP - General Rockey Situ Kathlene November, MD as Consulting Physician (Cardiology) Charlton Haws, [facility] as Pharmacist (Pharmacist)  Indicate any recent Medical Services you may have received from other than [facility] providers in the past year (date may be approximate).     Assessment:   This is a routine wellness examination for Barry Small.  Hearing/Vision screen Hearing Screening - Comments:: Wears aids Vision Screening - Comments:: No glasses- [place]  Dietary issues and exercise activities discussed: Current Exercise Habits: Home exercise routine, Type of exercise: walking, Time (Minutes): 60, Frequency (Times/Week): 3, Weekly Exercise (Minutes/Week): 180, Intensity: Mild   Goals Addressed             This Visit's Progress    DIET - EAT MORE FRUITS AND VEGETABLES         Depression Screen    03/14/2022    8:30 AM 03/13/2021    8:42 AM 02/11/2019    8:48 AM 01/21/2018    1:59 PM 12/09/2016    2:16 PM 12/02/2016   12:59 PM 10/31/2014   11:09 AM  PHQ 2/9 Scores  PHQ - 2 Score 0 0 0 0 0 0 0  PHQ- 9 Score 0 0 0 0       Fall Risk    03/14/2022    8:32 AM 03/13/2021    8:41 AM 02/11/2019    8:48 AM 01/21/2018    1:59 PM 12/09/2016    2:16 PM  Fall Risk   Falls in the past year? 0 1 0 No No  Number falls in past yr: 0 0     Injury with Fall? 0 0     Risk for fall due to : No Fall Risks Medication side effect     Follow up Falls evaluation completed Falls evaluation completed;Falls prevention discussed       FALL RISK PREVENTION PERTAINING TO THE HOME:  Any stairs in or around the home? Yes  If so, are there any without  handrails? No  Home free of loose throw rugs in walkways, pet beds, electrical cords, etc? Yes  Adequate lighting in your home to reduce risk of falls? Yes   ASSISTIVE DEVICES UTILIZED TO PREVENT FALLS:  Life alert? No  Use of a cane, walker or w/c? No  Grab bars in the bathroom? No  Shower chair or bench in shower? No   Elevated toilet seat or a handicapped toilet? No    Cognitive Function:    03/13/2021    8:50 AM 02/11/2019    8:49 AM 01/21/2018    1:59 PM 12/02/2016   12:59 PM  MMSE - Mini Mental State Exam  Orientation to time '5 5 5 5  '$ Orientation to Place '5 5 5 5  '$ Registration '3 3 3 3  '$ Attention/ Calculation 5 0 0 0  Recall '3 3 3 3  '$ Language- name 2 objects  0 0 0  Language- repeat '1 1 1 1  '$ Language- follow 3 step command  0 3 3  Language- read & follow direction  0 0 0  Write a sentence  0 0 0  Copy design  0 0 0  Total score  '17 20 20        '$ 03/14/2022    8:33 AM  6CIT Screen  What Year? 0 points  What month? 0 points  What time? 0 points  Count back from 20 0 points  Months in reverse 0 points  Repeat phrase 0 points  Total Score 0 points    Immunizations Immunization History  Administered Date(s) Administered   Fluad Quad(high Dose 65+) 06/18/2020   Influenza Whole 05/09/2010   Influenza, High Dose Seasonal PF 05/28/2014, 06/01/2017, 05/24/2019, 05/08/2021   Influenza-Unspecified 07/14/2015, 06/06/2016, 04/30/2018   PFIZER Comirnaty(Gray Top)Covid-19 Tri-Sucrose Vaccine 02/24/2021   PFIZER(Purple Top)SARS-COV-2 Vaccination 09/15/2019, 10/06/2019, 06/18/2020   PNEUMOCOCCAL CONJUGATE-20 02/24/2021   Pfizer Covid-19 Vaccine Bivalent Booster 66yr & up 06/30/2021   Pneumococcal Conjugate-13 09/08/2014   Pneumococcal Polysaccharide-23 07/07/2007, 07/22/2017   Td 06/27/2004   Tdap 11/02/2016   Zoster Recombinat (Shingrix) 05/23/2018, 08/12/2018   Zoster, Live 07/14/2007    TDAP status: Up to date  Flu Vaccine status: Up to date  Pneumococcal vaccine status: Up to date  Covid-19 vaccine status: Completed vaccines  Qualifies for Shingles Vaccine? Yes   Zostavax completed Yes   Shingrix Completed?: Yes  Screening Tests Health Maintenance  Topic Date Due   COVID-19 Vaccine (6 - Pfizer series) 10/31/2021   INFLUENZA VACCINE  04/08/2022   TETANUS/TDAP  11/02/2026    Pneumonia Vaccine 88 Years old  Completed   Zoster Vaccines- Shingrix  Completed   HPV VACCINES  Aged Out    Health Maintenance  Health Maintenance Due  Topic Date Due   COVID-19 Vaccine (6 - Pfizer series) 10/31/2021    Colorectal cancer screening: No longer required.   Lung Cancer Screening: (Low Dose CT Chest recommended if Age 86-80years, 30 pack-year currently smoking OR have quit w/in 15years.) does not qualify.    Additional Screening:  Hepatitis C Screening: does not qualify; Completed no  Vision Screening: Recommended annual ophthalmology exams for early detection of glaucoma and other disorders of the eye. Is the patient up to date with their annual eye exam?  Yes  Who is the provider or what is the name of the office in which the patient attends annual eye exams[facility]If pt is not established with a provider, would they like to be referred to  a provider to establish care? No .   Dental Screening: Recommended annual dental exams for proper oral hygiene  Community Resource Referral / Chronic Care Management: CRR required this visit?  No   CCM required this visit?  No      Plan:     I have personally reviewed and noted the following in the patient's chart:   Medical and social history Use of alcohol, tobacco or illicit drugs  Current medications and supplements including opioid prescriptions. Patient is not currently taking opioid prescriptions. Functional ability and status Nutritional status Physical activity Advanced directives List of other physicians Hospitalizations, surgeries, and ER visits in previous 12 months Vitals Screenings to include cognitive, depression, and falls Referrals and appointments  In addition, I have reviewed and discussed with patient certain preventive protocols, quality metrics, and best practice recommendations. A written personalized care plan for preventive services as well as general preventive health  recommendations were provided to patient.     Dionisio David, LPN   0/10/5425   Nurse Notes: none

## 2022-03-14 NOTE — Patient Instructions (Signed)
[name] , Thank you for taking time to come for your Medicare Wellness Visit. I appreciate your ongoing commitment to your health goals. Please review the following plan we discussed and let me know if I can assist you in the future.   Screening recommendations/referrals: Colonoscopy: aged out Recommended yearly ophthalmology/optometry visit for glaucoma screening and checkup Recommended yearly dental visit for hygiene and checkup  Vaccinations: Influenza vaccine: 05/08/21 Pneumococcal vaccine: 02/24/21 Tdap vaccine: 11/02/16 Shingles vaccine: Zostavax 07/14/07 Shingrix 05/23/18, 08/12/18   Covid-19: 09/15/19, 10/06/19, 06/18/20, 02/24/21, 06/30/21  Advanced directives: no  Conditions/risks identified: none  Next appointment: Follow up in one year for your annual wellness visit. 03/17/23 @ 11:45am by phone (wife appt is same day)  Preventive Care 86 Years and Older, Male Preventive care refers to lifestyle choices and visits with your health care provider that can promote health and wellness. What does preventive care include? A yearly physical exam. This is also called an annual well check. Dental exams once or twice a year. Routine eye exams. Ask your health care provider how often you should have your eyes checked. Personal lifestyle choices, including: Daily care of your teeth and gums. Regular physical activity. Eating a healthy diet. Avoiding tobacco and drug use. Limiting alcohol use. Practicing safe sex. Taking low doses of aspirin every day. Taking vitamin and mineral supplements as recommended by your health care provider. What happens during an annual well check? The services and screenings done by your health care provider during your annual well check will depend on your age, overall health, lifestyle risk factors, and family history of disease. Counseling  Your health care provider may ask you questions about your: Alcohol use. Tobacco use. Drug use. Emotional  well-being. Home and relationship well-being. Sexual activity. Eating habits. History of falls. Memory and ability to understand (cognition). Work and work Statistician. Screening  You may have the following tests or measurements: Height, weight, and BMI. Blood pressure. Lipid and cholesterol levels. These may be checked every 5 years, or more frequently if you are over 53 years old. Skin check. Lung cancer screening. You may have this screening every year starting at age 85 if you have a 30-pack-year history of smoking and currently smoke or have quit within the past 15 years. Fecal occult blood test (FOBT) of the stool. You may have this test every year starting at age 39. Flexible sigmoidoscopy or colonoscopy. You may have a sigmoidoscopy every 5 years or a colonoscopy every 10 years starting at age 89. Prostate cancer screening. Recommendations will vary depending on your family history and other risks. Hepatitis C blood test. Hepatitis B blood test. Sexually transmitted disease (STD) testing. Diabetes screening. This is done by checking your blood sugar (glucose) after you have not eaten for a while (fasting). You may have this done every 1-3 years. Abdominal aortic aneurysm (AAA) screening. You may need this if you are a current or former smoker. Osteoporosis. You may be screened starting at age 2 if you are at high risk. Talk with your health care provider about your test results, treatment options, and if necessary, the need for more tests. Vaccines  Your health care provider may recommend certain vaccines, such as: Influenza vaccine. This is recommended every year. Tetanus, diphtheria, and acellular pertussis (Tdap, Td) vaccine. You may need a Td booster every 10 years. Zoster vaccine. You may need this after age 20. Pneumococcal 13-valent conjugate (PCV13) vaccine. One dose is recommended after age 66. Pneumococcal polysaccharide (PPSV23) vaccine. One  dose is recommended after  age 47. Talk to your health care provider about which screenings and vaccines you need and how often you need them. This information is not intended to replace advice given to you by your health care provider. Make sure you discuss any questions you have with your health care provider. Document Released: 09/21/2015 Document Revised: 05/14/2016 Document Reviewed: 06/26/2015 Elsevier Interactive Patient Education  2017 [place] Prevention in the Home Falls can cause injuries. They can happen to people of all ages. There are many things you can do to make your home safe and to help prevent falls. What can I do on the outside of my home? Regularly fix the edges of walkways and driveways and fix any cracks. Remove anything that might make you trip as you walk through a door, such as a raised step or threshold. Trim any bushes or trees on the path to your home. Use bright outdoor lighting. Clear any walking paths of anything that might make someone trip, such as rocks or tools. Regularly check to see if handrails are loose or broken. Make sure that both sides of any steps have handrails. Any raised decks and porches should have guardrails on the edges. Have any leaves, snow, or ice cleared regularly. Use sand or salt on walking paths during winter. Clean up any spills in your garage right away. This includes oil or grease spills. What can I do in the bathroom? Use night lights. Install grab bars by the toilet and in the tub and shower. Do not use towel bars as grab bars. Use non-skid mats or decals in the tub or shower. If you need to sit down in the shower, use a plastic, non-slip stool. Keep the floor dry. Clean up any water that spills on the floor as soon as it happens. Remove soap buildup in the tub or shower regularly. Attach bath mats securely with double-sided non-slip rug tape. Do not have throw rugs and other things on the floor that can make you trip. What can I do in the  bedroom? Use night lights. Make sure that you have a light by your bed that is easy to reach. Do not use any sheets or blankets that are too big for your bed. They should not hang down onto the floor. Have a firm chair that has side arms. You can use this for support while you get dressed. Do not have throw rugs and other things on the floor that can make you trip. What can I do in the kitchen? Clean up any spills right away. Avoid walking on wet floors. Keep items that you use a lot in easy-to-reach places. If you need to reach something above you, use a strong step stool that has a grab bar. Keep electrical cords out of the way. Do not use floor polish or wax that makes floors slippery. If you must use wax, use non-skid floor wax. Do not have throw rugs and other things on the floor that can make you trip. What can I do with my stairs? Do not leave any items on the stairs. Make sure that there are handrails on both sides of the stairs and use them. Fix handrails that are broken or loose. Make sure that handrails are as long as the stairways. Check any carpeting to make sure that it is firmly attached to the stairs. Fix any carpet that is loose or worn. Avoid having throw rugs at the top or bottom of the  stairs. If you do have throw rugs, attach them to the floor with carpet tape. Make sure that you have a light switch at the top of the stairs and the bottom of the stairs. If you do not have them, ask someone to add them for you. What else can I do to help prevent falls? Wear shoes that: Do not have high heels. Have rubber bottoms. Are comfortable and fit you well. Are closed at the toe. Do not wear sandals. If you use a stepladder: Make sure that it is fully opened. Do not climb a closed stepladder. Make sure that both sides of the stepladder are locked into place. Ask someone to hold it for you, if possible. Clearly mark and make sure that you can see: Any grab bars or  handrails. First and last steps. Where the edge of each step is. Use tools that help you move around (mobility aids) if they are needed. These include: Canes. Walkers. Scooters. Crutches. Turn on the lights when you go into a dark area. Replace any light bulbs as soon as they burn out. Set up your furniture so you have a clear path. Avoid moving your furniture around. If any of your floors are uneven, fix them. If there are any pets around you, be aware of where they are. Review your medicines with your doctor. Some medicines can make you feel dizzy. This can increase your chance of falling. Ask your doctor what other things that you can do to help prevent falls. This information is not intended to replace advice given to you by your health care provider. Make sure you discuss any questions you have with your health care provider. Document Released: 06/21/2009 Document Revised: 01/31/2016 Document Reviewed: 09/29/2014 Elsevier Interactive Patient Education  2017 [employer].

## 2022-03-19 ENCOUNTER — Other Ambulatory Visit: Payer: Self-pay | Admitting: Family Medicine

## 2022-03-19 DIAGNOSIS — I10 Essential (primary) hypertension: Secondary | ICD-10-CM

## 2022-03-20 ENCOUNTER — Other Ambulatory Visit: Payer: Medicare Other

## 2022-03-26 ENCOUNTER — Other Ambulatory Visit (INDEPENDENT_AMBULATORY_CARE_PROVIDER_SITE_OTHER): Payer: Medicare Other

## 2022-03-26 DIAGNOSIS — I10 Essential (primary) hypertension: Secondary | ICD-10-CM

## 2022-03-26 LAB — COMPREHENSIVE METABOLIC PANEL
ALT: 21 U/L (ref 0–53)
AST: 25 U/L (ref 0–37)
Albumin: 4.1 g/dL (ref 3.5–5.2)
Alkaline Phosphatase: 48 U/L (ref 39–117)
BUN: 18 mg/dL (ref 6–23)
CO2: 25 mEq/L (ref 19–32)
Calcium: 8.9 mg/dL (ref 8.4–10.5)
Chloride: 100 mEq/L (ref 96–112)
Creatinine, Ser: 0.71 mg/dL (ref 0.40–1.50)
GFR: 83.49 mL/min (ref >60.00)
Glucose, Bld: 99 mg/dL (ref 70–99)
Potassium: 4.5 mEq/L (ref 3.5–5.1)
Sodium: 131 mEq/L — ABNORMAL LOW (ref 135–145)
Total Bilirubin: 0.6 mg/dL (ref 0.2–1.2)
Total Protein: 6.5 g/dL (ref 6.0–8.3)

## 2022-03-26 LAB — LIPID PANEL
Cholesterol: 121 mg/dL (ref 0–200)
HDL: 46.9 mg/dL (ref >39.00)
LDL Cholesterol: 63 mg/dL (ref 0–99)
NonHDL: 73.92
Total CHOL/HDL Ratio: 3
Triglycerides: 53 mg/dL (ref 0.0–149.0)
VLDL: 10.6 mg/dL (ref 0.0–40.0)

## 2022-03-27 ENCOUNTER — Other Ambulatory Visit: Payer: Self-pay | Admitting: Family Medicine

## 2022-03-31 ENCOUNTER — Encounter: Payer: Self-pay | Admitting: Family Medicine

## 2022-03-31 ENCOUNTER — Ambulatory Visit (INDEPENDENT_AMBULATORY_CARE_PROVIDER_SITE_OTHER): Payer: Medicare Other | Admitting: Family Medicine

## 2022-03-31 VITALS — BP 130/62 | HR 63 | Temp 98.0°F | Ht 67.0 in | Wt 163.0 lb

## 2022-03-31 DIAGNOSIS — E7800 Pure hypercholesterolemia, unspecified: Secondary | ICD-10-CM

## 2022-03-31 DIAGNOSIS — Z7189 Other specified counseling: Secondary | ICD-10-CM

## 2022-03-31 DIAGNOSIS — I10 Essential (primary) hypertension: Secondary | ICD-10-CM

## 2022-03-31 DIAGNOSIS — I25118 Atherosclerotic heart disease of native coronary artery with other forms of angina pectoris: Secondary | ICD-10-CM

## 2022-03-31 DIAGNOSIS — K219 Gastro-esophageal reflux disease without esophagitis: Secondary | ICD-10-CM | POA: Diagnosis not present

## 2022-03-31 DIAGNOSIS — Z0000 Encounter for general adult medical examination without abnormal findings: Secondary | ICD-10-CM

## 2022-03-31 DIAGNOSIS — K5900 Constipation, unspecified: Secondary | ICD-10-CM

## 2022-03-31 MED ORDER — METOPROLOL TARTRATE 25 MG PO TABS: ORAL_TABLET | ORAL | 3 refills | Status: DC

## 2022-03-31 MED ORDER — OMEPRAZOLE 20 MG PO CPDR: 20.0000 mg | DELAYED_RELEASE_CAPSULE | ORAL | 3 refills | Status: DC

## 2022-03-31 MED ORDER — ROSUVASTATIN CALCIUM 20 MG PO TABS
20.0000 mg | ORAL_TABLET | Freq: Every day | ORAL | 3 refills | Status: DC
Start: 2022-03-31 — End: 2023-04-02

## 2022-03-31 NOTE — Patient Instructions (Addendum)
You can try either a probiotic or miralax if needed for constipation.   Your brief memory testing was normal.  If you notice more changes or troubles then let me know.  Try to use lists and try to keep from getting distracted.   Take care.  Glad to see you.

## 2022-03-31 NOTE — Progress Notes (Unsigned)
Elevated Cholesterol: Using medications without problems: yes Muscle aches: no Diet compliance: yes Exercise: yes  Some occ constipation managed with miralax.    Hypertension:    Using medication without problems or lightheadedness:  yes Chest pain with exertion:no Edema:no Short of breath:no Labs d/w pt.    GERD controlled with MWF PPI use.    Stress level is better since he stopped his part time job.  Discussed.    Advance directive- wife designated if patient were incapacitated.   Defer colonoscopy and PSA given his age.  D/w pt.  He agrees. shingrix prev done. tdap 2018 Flu d/w pt PNA up to date.  covid 2021 Memory testing done.  Year/month/day wnl.  3/3 registration and recall.   Can read a watch and do basic math.    Labs discussed with patient.  PMH and SH reviewed  Meds, vitals, and allergies reviewed.   ROS: Per HPI unless specifically indicated in ROS section   GEN: nad, alert and oriented HEENT: ncat NECK: supple w/o LA CV: rrr. PULM: ctab, no inc wob ABD: soft, +bs EXT: no edema SKIN: no acute rash

## 2022-04-02 NOTE — Assessment & Plan Note (Signed)
GERD controlled with MWF PPI use.   Continue omeprazole as is.

## 2022-04-02 NOTE — Assessment & Plan Note (Signed)
Continue Crestor 

## 2022-04-02 NOTE — Assessment & Plan Note (Signed)
Continue as needed MiraLAX.

## 2022-04-02 NOTE — Assessment & Plan Note (Signed)
Continue metoprolol and lisinopril. 

## 2022-04-02 NOTE — Assessment & Plan Note (Signed)
Advance directive- wife designated if patient were incapacitated.   Defer colonoscopy and PSA given his age.  D/w pt.  He agrees. shingrix prev done. tdap 2018 Flu d/w pt PNA up to date.  covid 2021 Memory testing done.  Year/month/day wnl.  3/3 registration and recall.   Can read a watch and do basic math.

## 2022-04-02 NOTE — Assessment & Plan Note (Signed)
Advance directive- wife designated if patient were incapacitated.  

## 2022-05-07 DIAGNOSIS — Z23 Encounter for immunization: Secondary | ICD-10-CM | POA: Diagnosis not present

## 2022-07-04 DIAGNOSIS — Z23 Encounter for immunization: Secondary | ICD-10-CM | POA: Diagnosis not present

## 2022-07-14 DIAGNOSIS — S61432A Puncture wound without foreign body of left hand, initial encounter: Secondary | ICD-10-CM | POA: Diagnosis not present

## 2022-07-14 DIAGNOSIS — L03114 Cellulitis of left upper limb: Secondary | ICD-10-CM | POA: Diagnosis not present

## 2022-07-15 ENCOUNTER — Telehealth: Payer: Self-pay

## 2022-07-15 NOTE — Progress Notes (Signed)
Chronic Care Management Pharmacy Assistant   Name: Barry Small  MRN: [id number] DOB: [birth year]   Reason for Encounter: General Adherence     Recent office visits:  03/31/22-Barry Duncan,MD(PCP)-[facility], discussed vaccines, screenings,reviewed labs (elevated HLD)  can try either a probiotic or miralax if needed for constipation. No medication changes   Recent consult visits:  None since last CCM contact  Hospital visits:  None in previous 6 months  Medications: Outpatient Encounter Medications as of 07/15/2022  Medication Sig   aspirin 81 MG EC tablet Take 81 mg by mouth daily.   Cholecalciferol (VITAMIN D3) 2000 units TABS Take by mouth daily.   Coenzyme Q10 (CO Q-10) 100 MG CAPS Take 300 mg by mouth daily.   Cyanocobalamin (VITAMIN B-12) 5000 MCG SUBL Place under the tongue daily.   lisinopril (ZESTRIL) 20 MG tablet TAKE 1 TABLET BY MOUTH EVERY DAY   metoprolol tartrate (LOPRESSOR) 25 MG tablet TAKE 0.5 TABLETS BY MOUTH 2 TIMES DAILY.   Multiple Vitamins-Minerals (ZINC PO) Take 1,000 mg by mouth daily.   nitroGLYCERIN (NITROSTAT) 0.4 MG SL tablet DISSOLVE ONE TABLET UNDER TONGUE EVERY 5 MINUTES AS NEEDED FOR CHEST PAIN   omeprazole (PRILOSEC) 20 MG capsule Take 1 capsule (20 mg total) by mouth every Monday, Wednesday, and Friday.   polyethylene glycol powder (GLYCOLAX/MIRALAX) powder Take 17 g by mouth daily as needed for mild constipation.   rosuvastatin (CRESTOR) 20 MG tablet Take 1 tablet (20 mg total) by mouth daily.   zinc gluconate 50 MG tablet Take 50 mg by mouth daily.   No facility-administered encounter medications on file as of 07/15/2022.      Contacted Barry Small on 07/16/22 for general disease state and medication adherence call.   Patient is not more than 5 days past due for refill on the following medications per chart history:  Star Medications: Medication Name/mg Last Fill Days Supply Lisinopril '20mg'$   07/09/22  90 Rosuvastatin  '20mg'$   05/01/22 90    Verified fill dates    What concerns do you have about your medications? No concerns at this time   The patient denies side effects with their medications.   How often do you forget or accidentally miss a dose? Never  Do you use a pillbox? Yes the Patient reports his wife puts in organizer for the week   Are you having any problems getting your medications from your pharmacy? No  Has the cost of your medications been a concern? No  Since last visit with CPP, no interventions have been made.   The patient has had an ED visit since last contact. [place].Put on antibiotics and ibuprofen for pain and is improving. He will follow up with PCP upon return home.  The patient denies problems with their health.   Patient denies concerns or questions for Barry Small, PharmD at this time.   Counseled patient on:  Barry Small job taking medications, Importance of taking medication daily without missed doses, Benefits of adherence packaging or a pillbox, and Access to CCM team for any cost, medication or pharmacy concerns.   Care Gaps: Annual wellness visit in last year? Yes Most Recent BP reading:130/62   03/31/22   Summary of recommendations from last [facility] visit (Date:10/16/21) Summary: -CCM Initial visit: pt endorses compliance with medications, denies issues -Emphasized continued adherence to medications and contacting providers if issues arise  Recommendations/Changes made from today's visit: -No med changes  Upcoming appointments: No appointments scheduled within the next 30 days.  Barry Small, CPP notified  Barry Small, [place]  [phone]

## 2022-07-16 DIAGNOSIS — I10 Essential (primary) hypertension: Secondary | ICD-10-CM | POA: Diagnosis not present

## 2022-07-16 DIAGNOSIS — S61432D Puncture wound without foreign body of left hand, subsequent encounter: Secondary | ICD-10-CM | POA: Diagnosis not present

## 2022-07-16 DIAGNOSIS — L03114 Cellulitis of left upper limb: Secondary | ICD-10-CM | POA: Diagnosis not present

## 2022-09-15 ENCOUNTER — Encounter: Payer: Self-pay | Admitting: Cardiovascular Disease

## 2022-09-15 ENCOUNTER — Ambulatory Visit: Payer: Medicare HMO | Attending: Cardiovascular Disease | Admitting: Cardiovascular Disease

## 2022-09-15 VITALS — BP 122/58 | HR 71 | Ht 67.0 in | Wt 169.1 lb

## 2022-09-15 DIAGNOSIS — I25118 Atherosclerotic heart disease of native coronary artery with other forms of angina pectoris: Secondary | ICD-10-CM

## 2022-09-15 NOTE — Patient Instructions (Signed)
Medication Instructions:  No changes  If you need a refill on your cardiac medications before your next appointment, please call your pharmacy.   Lab work: No new labs needed  Testing/Procedures: No new testing needed  Follow-Up: At [facility], you and your health needs are our priority.  As part of our continuing mission to provide you with exceptional heart care, we have created designated Provider Care Teams.  These Care Teams include your primary Cardiologist (physician) and Advanced Practice Providers (APPs -  Physician Assistants and Nurse Practitioners) who all work together to provide you with the care you need, when you need it.  You will need a follow up appointment in 12 months  Providers on your designated Care Team:   Christopher Berge, NP Ryan Dunn, PA-C Cadence Furth, PA-C  COVID-19 Vaccine Information can be found at: [email or web address] For questions related to vaccine distribution or appointments, please email [email or web address] or call [phone].   

## 2022-09-15 NOTE — Progress Notes (Signed)
Cardiology Office Note  Date:  09/15/2022   ID:  Barry Small, DOB 1934-03-29, MRN [id number]  PCP:  Tonia Ghent, MD   Chief Complaint  Patient presents with   12 month follow up     "Doing well." Medications reviewed by the patient verbally.     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  This morning, was drinking too much coffee this AM, BP was running high Well controlled in office this afternoon, reports he is not fixed to caffeine  In the past was going to the gym, [employer], has not gone in 3 months  Lab work reviewed  total cholesterol 121 LDL 63 Sodium 131 creatinine 0.71  Was in the emergency room February 2022 with fever and dizziness Noncardiac issues  covid sept 2020  EKG personally reviewed by myself on todays visit Normal sinus rhythm rate 71 bpm PACs, PVCs  Previous stress Myoview test 2021  Nuclear stress 11/2017, no significant ischemia Perfusion abnormality of the basal inferior wall to mid to distal region consistent with previous inferior wall MI Inferior wall hypokinesis,  EF estimated at 47% No EKG changes concerning for ischemia at peak stress or in recovery. Resting EKG with old inferior MI   Other past medical history Previous pneumonia in February 2014   His last stress nuclear study was in May 2017. EF was normal with an old inferior wall infarct scar. There was no ischemia.    PMH:   has a past medical history of Barrett's esophagus, Colon polyps (04/2003), Coronary artery disease, Elevated bilirubin, Elevated glucose, GERD (gastroesophageal reflux disease), Hyperlipidemia,  Hypertension, Myocardial infarction ([place]), and Pneumonia (2014).  PSH:    Past Surgical History:  Procedure Laterality Date   BELPHAROPTOSIS REPAIR  08/2018   Cardiolite  12/2001   h/o vessel infarct EF 66%   CATARACT EXTRACTION W/ INTRAOCULAR LENS  IMPLANT, BILATERAL  09/2018   CHOLECYSTECTOMY  09/2001   COLONOSCOPY  11/15/2008   Mild divertics, 5 yrs, Dr. Fuller Plan   COLONOSCOPY W/ POLYPECTOMY  05/09/2003   x multiple, Dr. Vira Agar   CORONARY ANGIOPLASTY  2001   x 2, Dr.Pulsipher   CORONARY ANGIOPLASTY WITH STENT PLACEMENT  12/05/2005   CORONARY ARTERY BYPASS GRAFT  1989   [facility]   ESOPHAGOGASTRODUODENOSCOPY  05/09/2003   Barrett's   ESOPHAGOGASTRODUODENOSCOPY  11/17/08   Barrett's Esoph Erosive Esoph HH (Dr. Fuller Plan)  2 years   EYE SURGERY  09/2018   cataract extraction   [facility]  03/28-03/31/2007   Chest pain   NASAL SINUS SURGERY  06/23/2008   Dr.Clark   NOSE SURGERY  06/23/2008   Dr. Carlis Abbott   Stress Myoview  09/30/2004   Normal EF 70%   Stress Myoview  02/11/2006   No change, c/w 09/2004   Stress Myoview  02/22/2008   Abnormal w/prior inf infarct  No signif ischemia    Current Outpatient Medications  Medication Sig Dispense Refill   aspirin 81 MG EC tablet Take 81 mg by mouth daily.     Cholecalciferol (VITAMIN D3) 2000 units TABS Take by mouth daily.     Coenzyme Q10 (CO Q-10) 100 MG CAPS Take  300 mg by mouth daily.     Cyanocobalamin (VITAMIN B-12) 5000 MCG SUBL Place under the tongue daily.     lisinopril (ZESTRIL) 20 MG tablet TAKE 1 TABLET BY MOUTH EVERY DAY 90 tablet 3   metoprolol tartrate (LOPRESSOR) 25 MG tablet TAKE 0.5 TABLETS BY MOUTH 2 TIMES DAILY. 90 tablet 3   Multiple Vitamins-Minerals (ZINC PO) Take 1,000 mg by mouth daily.     nitroGLYCERIN (NITROSTAT) 0.4 MG SL tablet DISSOLVE ONE TABLET UNDER TONGUE EVERY 5 MINUTES AS NEEDED FOR CHEST PAIN 25 tablet 0   omeprazole (PRILOSEC) 20 MG capsule Take 1 capsule (20 mg total) by mouth every Monday, Wednesday, and  Friday. 45 capsule 3   polyethylene glycol powder (GLYCOLAX/MIRALAX) powder Take 17 g by mouth daily as needed for mild constipation.     rosuvastatin (CRESTOR) 20 MG tablet Take 1 tablet (20 mg total) by mouth daily. 90 tablet 3   zinc gluconate 50 MG tablet Take 50 mg by mouth daily.     No current facility-administered medications for this visit.    Allergies:   Atorvastatin, Morphine, and Simvastatin   Social History:  The patient  reports that he has never smoked. He has never used smokeless tobacco. He reports current alcohol use. He reports that he does not use drugs.   Family History:   family history includes Heart disease in his father; Pneumonia in his mother.   Review of Systems: Review of Systems  Constitutional: Negative.   Respiratory: Negative.    Cardiovascular: Negative.   Gastrointestinal: Negative.   Musculoskeletal: Negative.   Neurological: Negative.   Psychiatric/Behavioral: Negative.    All other systems reviewed and are negative.   PHYSICAL EXAM: VS:  BP (!) 122/58 (BP Location: Left Arm, Patient Position: Sitting, Cuff Size: Normal)   Pulse 71   Ht '5\' 7"'$  (1.702 m)   Wt 169 lb 2 oz (76.7 kg)   SpO2 99%   BMI 26.49 kg/m  , BMI Body mass index is 26.49 kg/m. Constitutional:  oriented to person, place, and time. No distress.  HENT:  Head: Grossly normal Eyes:  no discharge. No scleral icterus.  Neck: No JVD, no carotid bruits  Cardiovascular: Regular rate and rhythm, no murmurs appreciated Pulmonary/Chest: Clear to auscultation bilaterally, no wheezes or rails Abdominal: Soft.  no distension.  no tenderness.  Musculoskeletal: Normal range of motion Neurological:  normal muscle tone. Coordination normal. No atrophy Skin: Skin warm and dry Psychiatric: normal affect, pleasant  Recent Labs: 03/26/2022: ALT 21; BUN 18; Creatinine, Ser 0.71; Potassium 4.5; Sodium 131    Lipid Panel Lab Results  Component Value Date   CHOL 121 03/26/2022   HDL  46.90 03/26/2022   LDLCALC 63 03/26/2022   TRIG 53.0 03/26/2022    Wt Readings from Last 3 Encounters:  09/15/22 169 lb 2 oz (76.7 kg)  03/31/22 163 lb (73.9 kg)  03/14/22 168 lb (76.2 kg)     ASSESSMENT AND PLAN:  Essential hypertension - Plan: EKG 12-Lead Blood pressure is well controlled on today's visit. No changes made to the medications.  Atherosclerosis of native coronary artery without angina pectoris, unspecified whether native or transplanted heart - Currently with no symptoms of angina. No further workup at this time. Continue current medication regimen Old inferior MI Last stress test 2021, no ischemia No angina  HYPERCHOLESTEROLEMIA Cholesterol is at goal on the current lipid regimen. No changes to the medications were made.   Total encounter time more than 30  minutes  Greater than 50% was spent in counseling and coordination of care with the patient   No orders of the defined types were placed in this encounter.    Signed, Esmond Plants, M.D., Ph.D. 09/15/2022  [place], [place]

## 2022-10-16 ENCOUNTER — Ambulatory Visit: Payer: Medicare Other | Admitting: Pharmacist

## 2022-10-16 NOTE — Patient Instructions (Signed)
Visit Information  Phone number for Pharmacist: [phone]  Thank you for meeting with me to discuss your medications! Below is a summary of what we talked about during the visit:   Summary: -Reviewed medications; pt affirms compliance and denies issues -HTN: BP at goal in clinic (122/58 most recently) -HLD: LDL 63 (03/2022) at goal  Recommendations/Changes made from today's visit: -No med changes  Follow up plan: -Pharmacist follow up PRN -PCP annual visit due 03/2023   [name], PharmD, BCACP Clinical Pharmacist [facility] at [facility] [phone]

## 2022-10-16 NOTE — Progress Notes (Signed)
Care Management & Coordination Services Pharmacy Note  10/16/2022 Name:  Barry Small MRN:  [id number] DOB:  November 04, 1933  Summary: -Reviewed medications; pt affirms compliance and denies issues -HTN: BP at goal in clinic (122/58 most recently) -HLD: LDL 63 (03/2022) at goal  Recommendations/Changes made from today's visit: -No med changes  Follow up plan: -Pharmacist follow up PRN -PCP annual visit due 03/2023    Subjective: Barry Small is an 89 y.o. year old male who is a primary patient of Tonia Ghent, MD.  The care coordination team was consulted for assistance with disease management and care coordination needs.    Engaged with patient by telephone for follow up visit.  Recent office visits: 03/31/22 Dr Damita Dunnings OV: annual - defer colonoscopy d/t age. Try probiotic or miralax PRN for constipation. Memory testing normal.   Recent consult visits: 09/15/22 Dr Rockey Situ OV: f/u atherosclerosis, no changes.  Hospital visits: None in previous 6 months   Objective:  Lab Results  Component Value Date   CREATININE 0.71 03/26/2022   BUN 18 03/26/2022   GFR 83.49 03/26/2022   GFRNONAA >60 10/26/2020   GFRAA 143 08/30/2008   NA 131 (L) 03/26/2022   K 4.5 03/26/2022   CALCIUM 8.9 03/26/2022   CO2 25 03/26/2022   GLUCOSE 99 03/26/2022    Lab Results  Component Value Date/Time   GFR 83.49 03/26/2022 09:19 AM   GFR 83.72 03/25/2021 08:44 AM   MICROALBUR 0.3 10/29/2009 08:09 AM   MICROALBUR 0.2 08/30/2008 08:34 AM    Last diabetic Eye exam: No results found for: "HMDIABEYEEXA"  Last diabetic Foot exam: No results found for: "HMDIABFOOTEX"   Lab Results  Component Value Date   CHOL 121 03/26/2022   HDL 46.90 03/26/2022   LDLCALC 63 03/26/2022   TRIG 53.0 03/26/2022   CHOLHDL 3 03/26/2022       Latest Ref Rng & Units 03/26/2022    9:19 AM 03/25/2021    8:44 AM 10/26/2020    3:20 PM  Hepatic Function  Total Protein 6.0 - 8.3 g/dL 6.5  6.6  6.2   Albumin  3.5 - 5.2 g/dL 4.1  4.2  3.6   AST 0 - 37 U/L 25  21  42   ALT 0 - 53 U/L '21  18  28   '$ Alk Phosphatase 39 - 117 U/L 48  52  55   Total Bilirubin 0.2 - 1.2 mg/dL 0.6  0.8  0.8     Lab Results  Component Value Date/Time   TSH 2.12 01/27/2011 08:49 AM   TSH 2.85 10/29/2009 08:09 AM       Latest Ref Rng & Units 10/26/2020    3:20 PM 11/19/2012   12:07 PM 01/27/2011    8:49 AM  CBC  WBC 4.0 - 10.5 K/uL 8.6  15.4  9.4   Hemoglobin 13.0 - 17.0 g/dL 12.3  14.0  15.5   Hematocrit 39.0 - 52.0 % 35.5  41.8  45.3   Platelets 150 - 400 K/uL 90  183.0  158.0     Lab Results  Component Value Date/Time   VD25OH 40 10/29/2009 08:42 PM    Clinical ASCVD: Yes  The ASCVD Risk score (Arnett DK, et al., 2019) failed to calculate for the following reasons:   The 2019 ASCVD risk score is only valid for ages 46 to 42        03/14/2022    8:30 AM 03/13/2021    8:42 AM 02/11/2019  8:48 AM  Depression screen PHQ 2/9  Decreased Interest 0 0 0  Down, Depressed, Hopeless 0 0 0  PHQ - 2 Score 0 0 0  Altered sleeping 0 0 0  Tired, decreased energy 0 0 0  Change in appetite 0 0 0  Feeling bad or failure about yourself  0 0 0  Trouble concentrating 0 0 0  Moving slowly or fidgety/restless 0 0 0  Suicidal thoughts 0 0 0  PHQ-9 Score 0 0 0  Difficult doing work/chores Not difficult at all Not difficult at all Not difficult at all     Social History   Tobacco Use  Smoking Status Never  Smokeless Tobacco Never   BP Readings from Last 3 Encounters:  09/15/22 (!) 122/58  03/31/22 130/62  09/18/21 140/70   Pulse Readings from Last 3 Encounters:  09/15/22 71  03/31/22 63  09/18/21 60   Wt Readings from Last 3 Encounters:  09/15/22 169 lb 2 oz (76.7 kg)  03/31/22 163 lb (73.9 kg)  03/14/22 168 lb (76.2 kg)   BMI Readings from Last 3 Encounters:  09/15/22 26.49 kg/m  03/31/22 25.53 kg/m  03/14/22 26.31 kg/m    Allergies  Allergen Reactions   Atorvastatin Other (See Comments)     REACTION: myalgia   Morphine Other (See Comments)    REACTION: syncope- lower BP but may be able to tolerate low dose of med.    Simvastatin Other (See Comments)    REACTION: ? reaction    Medications Reviewed Today     Reviewed by Michel Santee, CMA (Certified Medical Assistant) on 09/15/22 at [place] List Status: <None>   Medication Order Taking? Sig Documenting Provider Last Dose Status Informant  aspirin 81 MG EC tablet [id number] Yes Take 81 mg by mouth daily. [provider] Taking Active   Cholecalciferol (VITAMIN D3) 2000 units TABS [id number] Yes Take by mouth daily. [provider] Taking Active   Coenzyme Q10 (CO Q-10) 100 MG CAPS [id number] Yes Take 300 mg by mouth daily. [provider] Taking Active   Cyanocobalamin (VITAMIN B-12) 5000 MCG SUBL [id number] Yes Place under the tongue daily. [provider] Taking Active   lisinopril (ZESTRIL) 20 MG tablet [id number] Yes TAKE 1 TABLET BY MOUTH EVERY DAY Tonia Ghent, MD Taking Active   metoprolol tartrate (LOPRESSOR) 25 MG tablet [id number] Yes TAKE 0.5 TABLETS BY MOUTH 2 TIMES DAILY. Tonia Ghent, MD Taking Active   Multiple Vitamins-Minerals (ZINC PO) [id number] Yes Take 1,000 mg by mouth daily. [provider] Taking Active   nitroGLYCERIN (NITROSTAT) 0.4 MG SL tablet [id number] Yes DISSOLVE ONE TABLET UNDER TONGUE EVERY 5 MINUTES AS NEEDED FOR CHEST PAIN Minna Merritts, MD Taking Active   omeprazole (PRILOSEC) 20 MG capsule [id number] Yes Take 1 capsule (20 mg total) by mouth every Monday, Wednesday, and Friday. Tonia Ghent, MD Taking Active   polyethylene glycol powder [facility]) powder [id number] Yes Take 17 g by mouth daily as needed for mild constipation. Tonia Ghent, MD Taking Active   rosuvastatin (CRESTOR) 20 MG tablet [id number] Yes Take 1 tablet (20 mg total) by mouth daily. Tonia Ghent, MD Taking Active   zinc gluconate 50 MG tablet [id number] Yes  Take 50 mg by mouth daily. [provider] Taking Active             SDOH:  (Social Determinants of Health) assessments and interventions performed: No SDOH Interventions    Flowsheet Row  Clinical Support from 03/14/2022 in [place] at [place] [facility] from 10/16/2021 in [place] at [place] from 03/13/2021 in [place] at [place] from 02/11/2019 in [place] at [place] Interventions Intervention Not Indicated Intervention Not Indicated -- --  Housing Interventions Intervention Not Indicated -- -- --  Transportation Interventions Intervention Not Indicated -- -- --  Depression Interventions/Treatment  -- -- NAT5-5 Score <4 Follow-up Not Indicated PHQ2-9 Score <4 Follow-up Not Indicated  Financial Strain Interventions Intervention Not Indicated Intervention Not Indicated -- --  Physical Activity Interventions Intervention Not Indicated -- -- --  Stress Interventions Intervention Not Indicated -- -- --  Social Connections Interventions Intervention Not Indicated -- -- --       Medication Assistance: None required.  Patient affirms current coverage meets needs.  Medication Access: Within the past 30 days, how often has patient missed a dose of medication? 0 Is a pillbox or other method used to improve adherence? No  Factors that may affect medication adherence? no barriers identified Are meds synced by current pharmacy? No  Are meds delivered by current pharmacy? No  Does patient experience delays in picking up medications due to transportation concerns? No   [facility] Services Reviewed: Is patient disadvantaged to use [facility]?: Yes  Current Rx insurance plan: [place] Name and location of Current pharmacy:  [id number]Phone: [phone]: [phone] [facility] services reviewed with patient today?: No  Patient requests to transfer care to [facility]?: No  Reason patient declined to change pharmacies: Disadvantaged due to insurance/mail order  Compliance/Adherence/Medication fill history: Care Gaps: None  Star-Rating Drugs: Lisinopril - PDC 92% Rosuvastatin - PDC 77% (LF 03/2022, pt affirms they have refilled this recently)   Assessment/Plan  Hypertension (BP goal <140/90) -Controlled - BP at goal in recent office visits; pt checks BP at home occasionally and reports it is at goal -Current home readings: unavailable; Denies hypotensive/hypertensive symptoms -Current treatment: Lisinopril 20 mg daily - Appropriate, Effective, Safe, Accessible Metoprolol tartrate 25 mg - 1/2 tab BID - Appropriate, Effective, Safe, Accessible -Educated on BP goals and benefits of medications for prevention of heart attack, stroke and kidney damage; Importance of home blood pressure monitoring; Symptoms of hypotension and importance of maintaining adequate hydration; -Counseled to monitor BP at home periodically -Recommended to continue current medication  Hyperlipidemia: (LDL goal < 70) -Controlled - LDL 63 (03/2022) at goal; pt endorses compliance with medications; pt endorses compliance with statin -Hx CABG 1989, stenting 2007 -Current treatment: Aspirin 81 mg daily - Appropriate, Effective, Safe, Accessible Rosuvastatin 20 mg daily -Appropriate, Effective, Safe, Accessible Nitroglycerin 0.4 mg SL prn - never used Coenzyme Q10 100 mg daily - Query appropriate -Medications previously tried: pravastatin  -Educated on Cholesterol goals; Benefits of statin for ASCVD risk reduction; -Previuosly discussed CoQ10 is typically used for statin-induced myalgias; pt does not recall history of this; other benefits are limited, pt can consider stopping if he wants but no  harm to continue -Recommended to continue current medication  GERD (Goal: manage symptoms) -Controlled - per pt report -Current treatment  Omeprazole 20 mg MWF - Appropriate, Effective, Safe, Accessible -Recommended to continue current medication  Health Maintenance -Vaccine gaps: none    LCharlene Brooke PharmD, BCataract And Laser Center West LLCClinical Pharmacist [facility] at [place]

## 2023-02-24 ENCOUNTER — Other Ambulatory Visit: Payer: Medicare HMO

## 2023-03-16 ENCOUNTER — Other Ambulatory Visit: Payer: Self-pay | Admitting: Family Medicine

## 2023-03-16 DIAGNOSIS — I10 Essential (primary) hypertension: Secondary | ICD-10-CM

## 2023-03-17 ENCOUNTER — Ambulatory Visit (INDEPENDENT_AMBULATORY_CARE_PROVIDER_SITE_OTHER): Payer: Medicare HMO

## 2023-03-17 VITALS — Ht 67.0 in | Wt 168.0 lb

## 2023-03-17 DIAGNOSIS — Z0000 Encounter for general adult medical examination without abnormal findings: Secondary | ICD-10-CM | POA: Diagnosis not present

## 2023-03-17 NOTE — Patient Instructions (Signed)
Barry Small , Thank you for taking time to come for your Medicare Wellness Visit. I appreciate your ongoing commitment to your health goals. Please review the following plan we discussed and let me know if I can assist you in the future.   These are the goals we discussed:  Goals      DIET - EAT MORE FRUITS AND VEGETABLES     Increase physical activity     Starting 02/11/2019, I will continue to walk at least 60 minutes daily.      Manage My Medicine     Timeframe:  Long-Range Goal Priority:  Medium Start Date:  10/16/21                           Expected End Date:  10/16/22                     Follow Up Date Feb 2024   - call for medicine refill 2 or 3 days before it runs out - call if I am sick and can't take my medicine - keep a list of all the medicines I take; vitamins and herbals too    Why is this important?   These steps will help you keep on track with your medicines.   Notes:      Patient Stated     03/13/2021, I will continue to go to the gym 2-3 days a week for 1 1/2- 2 hours.      Patient Stated     Stay active        This is a list of the screening recommended for you and due dates:  Health Maintenance  Topic Date Due   Medicare Annual Wellness Visit  03/15/2023   Flu Shot  04/09/2023   DTaP/Tdap/Td vaccine (3 - Td or Tdap) 11/02/2026   Pneumonia Vaccine  Completed   COVID-19 Vaccine  Completed   Zoster (Shingles) Vaccine  Completed   HPV Vaccine  Aged Out    Advanced directives: Please bring a copy of your health care power of attorney and living will to the office to be added to your chart at your convenience.   Conditions/risks identified: Aim for 30 minutes of exercise or brisk walking, 6-8 glasses of water, and 5 servings of fruits and vegetables each day.   Next appointment: Follow up in one year for your annual wellness visit. 03/17/24 @ 11:15 telephone  Preventive Care 65 Years and Older, Male  Preventive care refers to lifestyle choices and  visits with your health care provider that can promote health and wellness. What does preventive care include? A yearly physical exam. This is also called an annual well check. Dental exams once or twice a year. Routine eye exams. Ask your health care provider how often you should have your eyes checked. Personal lifestyle choices, including: Daily care of your teeth and gums. Regular physical activity. Eating a healthy diet. Avoiding tobacco and drug use. Limiting alcohol use. Practicing safe sex. Taking low doses of aspirin every day. Taking vitamin and mineral supplements as recommended by your health care provider. What happens during an annual well check? The services and screenings done by your health care provider during your annual well check will depend on your age, overall health, lifestyle risk factors, and family history of disease. Counseling  Your health care provider may ask you questions about your: Alcohol use. Tobacco use. Drug use. Emotional well-being. Home and  relationship well-being. Sexual activity. Eating habits. History of falls. Memory and ability to understand (cognition). Work and work Astronomer. Screening  You may have the following tests or measurements: Height, weight, and BMI. Blood pressure. Lipid and cholesterol levels. These may be checked every 5 years, or more frequently if you are over 46 years old. Skin check. Lung cancer screening. You may have this screening every year starting at age 62 if you have a 30-pack-year history of smoking and currently smoke or have quit within the past 15 years. Fecal occult blood test (FOBT) of the stool. You may have this test every year starting at age 23. Flexible sigmoidoscopy or colonoscopy. You may have a sigmoidoscopy every 5 years or a colonoscopy every 10 years starting at age 71. Prostate cancer screening. Recommendations will vary depending on your family history and other risks. Hepatitis C  blood test. Hepatitis B blood test. Sexually transmitted disease (STD) testing. Diabetes screening. This is done by checking your blood sugar (glucose) after you have not eaten for a while (fasting). You may have this done every 1-3 years. Abdominal aortic aneurysm (AAA) screening. You may need this if you are a current or former smoker. Osteoporosis. You may be screened starting at age 39 if you are at high risk. Talk with your health care provider about your test results, treatment options, and if necessary, the need for more tests. Vaccines  Your health care provider may recommend certain vaccines, such as: Influenza vaccine. This is recommended every year. Tetanus, diphtheria, and acellular pertussis (Tdap, Td) vaccine. You may need a Td booster every 10 years. Zoster vaccine. You may need this after age 10. Pneumococcal 13-valent conjugate (PCV13) vaccine. One dose is recommended after age 16. Pneumococcal polysaccharide (PPSV23) vaccine. One dose is recommended after age 53. Talk to your health care provider about which screenings and vaccines you need and how often you need them. This information is not intended to replace advice given to you by your health care provider. Make sure you discuss any questions you have with your health care provider. Document Released: 09/21/2015 Document Revised: 05/14/2016 Document Reviewed: 06/26/2015 Elsevier Interactive Patient Education  2017 [employer].  Fall Prevention in the Home Falls can cause injuries. They can happen to people of all ages. There are many things you can do to make your home safe and to help prevent falls. What can I do on the outside of my home? Regularly fix the edges of walkways and driveways and fix any cracks. Remove anything that might make you trip as you walk through a door, such as a raised step or threshold. Trim any bushes or trees on the path to your home. Use bright outdoor lighting. Clear any walking paths of  anything that might make someone trip, such as rocks or tools. Regularly check to see if handrails are loose or broken. Make sure that both sides of any steps have handrails. Any raised decks and porches should have guardrails on the edges. Have any leaves, snow, or ice cleared regularly. Use sand or salt on walking paths during winter. Clean up any spills in your garage right away. This includes oil or grease spills. What can I do in the bathroom? Use night lights. Install grab bars by the toilet and in the tub and shower. Do not use towel bars as grab bars. Use non-skid mats or decals in the tub or shower. If you need to sit down in the shower, use a plastic, non-slip stool. Keep  the floor dry. Clean up any water that spills on the floor as soon as it happens. Remove soap buildup in the tub or shower regularly. Attach bath mats securely with double-sided non-slip rug tape. Do not have throw rugs and other things on the floor that can make you trip. What can I do in the bedroom? Use night lights. Make sure that you have a light by your bed that is easy to reach. Do not use any sheets or blankets that are too big for your bed. They should not hang down onto the floor. Have a firm chair that has side arms. You can use this for support while you get dressed. Do not have throw rugs and other things on the floor that can make you trip. What can I do in the kitchen? Clean up any spills right away. Avoid walking on wet floors. Keep items that you use a lot in easy-to-reach places. If you need to reach something above you, use a strong step stool that has a grab bar. Keep electrical cords out of the way. Do not use floor polish or wax that makes floors slippery. If you must use wax, use non-skid floor wax. Do not have throw rugs and other things on the floor that can make you trip. What can I do with my stairs? Do not leave any items on the stairs. Make sure that there are handrails on both  sides of the stairs and use them. Fix handrails that are broken or loose. Make sure that handrails are as long as the stairways. Check any carpeting to make sure that it is firmly attached to the stairs. Fix any carpet that is loose or worn. Avoid having throw rugs at the top or bottom of the stairs. If you do have throw rugs, attach them to the floor with carpet tape. Make sure that you have a light switch at the top of the stairs and the bottom of the stairs. If you do not have them, ask someone to add them for you. What else can I do to help prevent falls? Wear shoes that: Do not have high heels. Have rubber bottoms. Are comfortable and fit you well. Are closed at the toe. Do not wear sandals. If you use a stepladder: Make sure that it is fully opened. Do not climb a closed stepladder. Make sure that both sides of the stepladder are locked into place. Ask someone to hold it for you, if possible. Clearly mark and make sure that you can see: Any grab bars or handrails. First and last steps. Where the edge of each step is. Use tools that help you move around (mobility aids) if they are needed. These include: Canes. Walkers. Scooters. Crutches. Turn on the lights when you go into a dark area. Replace any light bulbs as soon as they burn out. Set up your furniture so you have a clear path. Avoid moving your furniture around. If any of your floors are uneven, fix them. If there are any pets around you, be aware of where they are. Review your medicines with your doctor. Some medicines can make you feel dizzy. This can increase your chance of falling. Ask your doctor what other things that you can do to help prevent falls. This information is not intended to replace advice given to you by your health care provider. Make sure you discuss any questions you have with your health care provider. Document Released: 06/21/2009 Document Revised: 01/31/2016 Document Reviewed: 09/29/2014 Elsevier  Interactive Patient Education  2017 [employer].

## 2023-03-17 NOTE — Progress Notes (Signed)
Subjective:   Barry Small is a 89 y.o. male who presents for Medicare Annual/Subsequent preventive examination.  Visit Complete: Virtual  I connected with  Dianne Dun on 03/17/23 by a audio enabled telemedicine application and verified that I am speaking with the correct person using two identifiers.  Patient Location: Home  Provider Location: Office/Clinic  I discussed the limitations of evaluation and management by telemedicine. The patient expressed understanding and agreed to proceed.  Review of Systems      Cardiac Risk Factors include: advanced age (>59men, >77 women);hypertension;male gender     Objective:    Today's Vitals   03/17/23 1116  Weight: 168 lb (76.2 kg)  Height: 5\' 7"  (1.702 m)   Body mass index is 26.31 kg/m.     03/17/2023   11:25 AM 03/14/2022    8:32 AM 03/13/2021    8:39 AM 10/26/2020    3:23 PM 02/11/2019    8:49 AM 01/21/2018    2:25 PM 12/02/2016    1:25 PM  Advanced Directives  Does Patient Have a Medical Advance Directive? Yes No Yes No Yes Yes Yes  Type of Estate agent of [place];Living will  Healthcare Power of [place];Living will  Healthcare Power of [place];Living will Healthcare Power of [place];Living will Healthcare Power of Attorney  Copy of Healthcare Power of Attorney in Chart? No - copy requested  No - copy requested  No - copy requested No - copy requested No - copy requested  Would patient like information on creating a medical advance directive?  No - Patient declined  No - Patient declined       Current Medications (verified) Outpatient Encounter Medications as of 03/17/2023  Medication Sig   aspirin 81 MG EC tablet Take 81 mg by mouth daily.   Cholecalciferol (VITAMIN D3) 2000 units TABS Take by mouth daily.   Cyanocobalamin (VITAMIN B-12) 5000 MCG SUBL Place under the tongue daily.   lisinopril (ZESTRIL) 20 MG tablet TAKE 1 TABLET BY MOUTH EVERY DAY   metoprolol tartrate (LOPRESSOR) 25 MG  tablet TAKE 0.5 TABLETS BY MOUTH 2 TIMES DAILY.   Multiple Vitamins-Minerals (ZINC PO) Take 1,000 mg by mouth daily.   nitroGLYCERIN (NITROSTAT) 0.4 MG SL tablet DISSOLVE ONE TABLET UNDER TONGUE EVERY 5 MINUTES AS NEEDED FOR CHEST PAIN   omeprazole (PRILOSEC) 20 MG capsule Take 1 capsule (20 mg total) by mouth every Monday, Wednesday, and Friday.   rosuvastatin (CRESTOR) 20 MG tablet Take 1 tablet (20 mg total) by mouth daily.   zinc gluconate 50 MG tablet Take 50 mg by mouth daily.   Coenzyme Q10 (CO Q-10) 100 MG CAPS Take 300 mg by mouth daily. (Patient not taking: Reported on 03/17/2023)   polyethylene glycol powder (GLYCOLAX/MIRALAX) powder Take 17 g by mouth daily as needed for mild constipation. (Patient not taking: Reported on 03/17/2023)   No facility-administered encounter medications on file as of 03/17/2023.    Allergies (verified) Atorvastatin, Morphine, and Simvastatin   History: Past Medical History:  Diagnosis Date   Barrett's esophagus    Colon polyps 04/2003   type unknown    Coronary artery disease    Elevated bilirubin    Elevated glucose    GERD (gastroesophageal reflux disease)    Hyperlipidemia    Hypertension    Myocardial infarction [facility])    Pneumonia 2014   Past Surgical History:  Procedure Laterality Date   BELPHAROPTOSIS REPAIR  08/2018   Cardiolite  12/2001   h/o  vessel infarct EF 66%   CATARACT EXTRACTION W/ INTRAOCULAR LENS  IMPLANT, BILATERAL  09/2018   CHOLECYSTECTOMY  09/2001   COLONOSCOPY  11/15/2008   Mild divertics, 5 yrs, Dr. Russella Dar   COLONOSCOPY W/ POLYPECTOMY  05/09/2003   x multiple, Dr. Mechele Collin   CORONARY ANGIOPLASTY  2001   x 2, Dr.Pulsipher   CORONARY ANGIOPLASTY WITH STENT PLACEMENT  12/05/2005   CORONARY ARTERY BYPASS GRAFT  1989   [facility]   ESOPHAGOGASTRODUODENOSCOPY  05/09/2003   Barrett's   ESOPHAGOGASTRODUODENOSCOPY  11/17/08   Barrett's Esoph [facility] (Dr. Russella Dar)  2 years   EYE SURGERY  09/2018   cataract extraction   [facility]   03/28-03/31/2007   Chest pain   NASAL SINUS SURGERY  06/23/2008   Dr.Clark   NOSE SURGERY  06/23/2008   Dr. Chestine Spore   Stress Myoview  09/30/2004   Normal EF 70%   Stress Myoview  02/11/2006   No change, c/w 09/2004   Stress Myoview  02/22/2008   Abnormal w/prior inf infarct  No signif ischemia   Family History  Problem Relation Age of Onset   Heart disease Father    Pneumonia Mother    Colon cancer Neg Hx    Prostate cancer Neg Hx    Social History   Socioeconomic History   Marital status: Married    Spouse name: Not on file   Number of children: 2   Years of education: Not on file   Highest education level: Not on file  Occupational History   Occupation: Retired Science writer: [employer]   Occupation: Part time    Employer: [employer]  Tobacco Use   Smoking status: Never   Smokeless tobacco: Never  Vaping Use   Vaping Use: Never used  Substance and Sexual Activity   Alcohol use: Yes    Comment: occassional beer-socially   Drug use: No   Sexual activity: Not Currently  Other Topics Concern   Not on file  Social History Narrative   Caffeine use:  1   Retired from working part-time at [employer] 2023   Retired Engineer, structural.  Married to [employer] in 1969   Social Determinants of Health   Financial Resource Strain: Low Risk  (03/17/2023)   Overall Financial Resource Strain (CARDIA)    Difficulty of Paying Living Expenses: Not hard at all  Food Insecurity: No Food Insecurity (03/17/2023)   Hunger Vital Sign    Worried About Running Out of Food in the Last Year: Never true    Ran Out of Food in the Last Year: Never true  Transportation Needs: No Transportation Needs (03/17/2023)   PRAPARE - Administrator, Civil Service (Medical): No    Lack of Transportation (Non-Medical): No  Physical Activity: Sufficiently Active (03/17/2023)   Exercise Vital Sign    Days of Exercise per Week: 3 days    Minutes of Exercise per Session: 90 min  Stress: No Stress  Concern Present (03/17/2023)   [employer] of Occupational Health - Occupational Stress Questionnaire    Feeling of Stress : Not at all  Social Connections: Socially Integrated (03/17/2023)   Social Connection and Isolation Panel [NHANES]    Frequency of Communication with Friends and Family: More than three times a week    Frequency of Social Gatherings with Friends and Family: More than three times a week    Attends Religious Services: More than 4 times per year    Active Member  of Clubs or Organizations: Yes    Attends Engineer, structural: More than 4 times per year    Marital Status: Married    Tobacco Counseling Counseling given: Not Answered   Clinical Intake:  Pre-visit preparation completed: Yes  Pain : No/denies pain     BMI - recorded: 26.31 Nutritional Status: BMI 25 -29 Overweight Nutritional Risks: None Diabetes: No  How often do you need to have someone help you when you read instructions, pamphlets, or other written materials from your doctor or pharmacy?: 1 - Never  Interpreter Needed?: No  Information entered by :: C.Dalal Livengood LPN   Activities of Daily Living    03/17/2023   11:25 AM  In your present state of health, do you have any difficulty performing the following activities:  Hearing? 1  Comment wears aids  Vision? 0  Difficulty concentrating or making decisions? 0  Walking or climbing stairs? 0  Dressing or bathing? 0  Doing errands, shopping? 0  Preparing Food and eating ? N  Using the Toilet? N  In the past six months, have you accidently leaked urine? N  Do you have problems with loss of bowel control? N  Managing your Medications? N  Managing your Finances? N  Housekeeping or managing your Housekeeping? N    Patient Care Team: Joaquim Nam, MD as PCP - General Mariah Milling Tollie Pizza, MD as Consulting Physician (Cardiology) Kathyrn Sheriff, [facility] (Inactive) as Pharmacist (Pharmacist)  Indicate any recent Medical  Services you may have received from other than [facility] providers in the past year (date may be approximate).     Assessment:   This is a routine wellness examination for Mena.  Hearing/Vision screen Hearing Screening - Comments:: Wears aids Vision Screening - Comments:: No glasses - [facility] - UTD on eye exams  Dietary issues and exercise activities discussed:     Goals Addressed             This Visit's Progress    Patient Stated       Stay active       Depression Screen    03/17/2023   11:23 AM 03/14/2022    8:30 AM 03/13/2021    8:42 AM 02/11/2019    8:48 AM 01/21/2018    1:59 PM 12/09/2016    2:16 PM 12/02/2016   12:59 PM  PHQ 2/9 Scores  PHQ - 2 Score 0 0 0 0 0 0 0  PHQ- 9 Score  0 0 0 0      Fall Risk    03/17/2023   11:18 AM 03/14/2022    8:32 AM 03/13/2021    8:41 AM 02/11/2019    8:48 AM 01/21/2018    1:59 PM  Fall Risk   Falls in the past year? 0 0 1 0 No  Number falls in past yr: 0 0 0    Injury with Fall? 0 0 0    Risk for fall due to : No Fall Risks No Fall Risks Medication side effect    Follow up Falls prevention discussed;Falls evaluation completed Falls evaluation completed Falls evaluation completed;Falls prevention discussed      MEDICARE RISK AT HOME:  Medicare Risk at Home - 03/17/23 1126     Any stairs in or around the home? Yes    If so, are there any without handrails? No    Home free of loose throw rugs in walkways, pet beds, electrical cords, etc? Yes    Adequate lighting  in your home to reduce risk of falls? Yes    Life alert? No    Use of a cane, walker or w/c? No    Grab bars in the bathroom? Yes    Shower chair or bench in shower? No    Elevated toilet seat or a handicapped toilet? Yes             TIMED UP AND GO:  Was the test performed?  No    Cognitive Function:    03/13/2021    8:50 AM 02/11/2019    8:49 AM 01/21/2018    1:59 PM 12/02/2016   12:59 PM  MMSE - Mini Mental State Exam  Orientation to time 5 5 5 5    Orientation to Place 5 5 5 5   Registration 3 3 3 3   Attention/ Calculation 5 0 0 0  Recall 3 3 3 3   Language- name 2 objects  0 0 0  Language- repeat 1 1 1 1   Language- follow 3 step command  0 3 3  Language- read & follow direction  0 0 0  Write a sentence  0 0 0  Copy design  0 0 0  Total score  17 20 20         03/17/2023   11:26 AM 03/14/2022    8:33 AM  6CIT Screen  What Year? 0 points 0 points  What month? 0 points 0 points  What time? 0 points 0 points  Count back from 20 2 points 0 points  Months in reverse 0 points 0 points  Repeat phrase 0 points 0 points  Total Score 2 points 0 points    Immunizations Immunization History  Administered Date(s) Administered   COVID-19, mRNA, vaccine(Comirnaty)12 years and older 07/04/2022   Fluad Quad(high Dose 65+) 06/18/2020, 05/07/2022   Influenza Whole 05/09/2010   Influenza, High Dose Seasonal PF 05/28/2014, 06/01/2017, 05/24/2019, 05/08/2021   Influenza-Unspecified 07/14/2015, 06/06/2016, 04/30/2018   PFIZER Comirnaty(Gray Top)Covid-19 Tri-Sucrose Vaccine 02/24/2021   PFIZER(Purple Top)SARS-COV-2 Vaccination 09/15/2019, 10/06/2019, 06/18/2020   PNEUMOCOCCAL CONJUGATE-20 02/24/2021   Pfizer Covid-19 Vaccine Bivalent Booster 52yrs & up 06/30/2021   Pneumococcal Conjugate-13 09/08/2014   Pneumococcal Polysaccharide-23 07/07/2007, 07/22/2017   Respiratory Syncytial Virus Vaccine,Recomb Aduvanted(Arexvy) 07/04/2022   Td 06/27/2004   Tdap 11/02/2016   Zoster Recombinant(Shingrix) 05/23/2018, 08/12/2018   Zoster, Live 07/14/2007    TDAP status: Up to date  Flu Vaccine status: Up to date  Pneumococcal vaccine status: Up to date  Covid-19 vaccine status: Completed vaccines  Qualifies for Shingles Vaccine? Yes   Zostavax completed Yes   Shingrix Completed?: Yes  Screening Tests Health Maintenance  Topic Date Due   INFLUENZA VACCINE  04/09/2023   Medicare Annual Wellness (AWV)  03/16/2024   DTaP/Tdap/Td (3 - Td or  Tdap) 11/02/2026   Pneumonia Vaccine 27+ Years old  Completed   COVID-19 Vaccine  Completed   Zoster Vaccines- Shingrix  Completed   HPV VACCINES  Aged Out    Health Maintenance  There are no preventive care reminders to display for this patient.   Colorectal cancer screening: No longer required.   Lung Cancer Screening: (Low Dose CT Chest recommended if Age 98-80 years, 20 pack-year currently smoking OR have quit w/in 15years.) does not qualify.   Lung Cancer Screening Referral: n/a  Additional Screening:  Hepatitis C Screening: does not qualify; Completed n/a  Vision Screening: Recommended annual ophthalmology exams for early detection of glaucoma and other disorders of the eye. Is the patient  up to date with their annual eye exam?  Yes  Who is the provider or what is the name of the office in which the patient attends annual eye exams? [facility] If pt is not established with a provider, would they like to be referred to a provider to establish care? Yes .   Dental Screening: Recommended annual dental exams for proper oral hygiene    Community Resource Referral / Chronic Care Management: CRR required this visit?  No   CCM required this visit?  No     Plan:     I have personally reviewed and noted the following in the patient's chart:   Medical and social history Use of alcohol, tobacco or illicit drugs  Current medications and supplements including opioid prescriptions. Patient is not currently taking opioid prescriptions. Functional ability and status Nutritional status Physical activity Advanced directives List of other physicians Hospitalizations, surgeries, and ER visits in previous 12 months Vitals Screenings to include cognitive, depression, and falls Referrals and appointments  In addition, I have reviewed and discussed with patient certain preventive protocols, quality metrics, and best practice recommendations. A written personalized care plan for  preventive services as well as general preventive health recommendations were provided to patient.     Maryan Puls, LPN   09/13/1094   After Visit Summary: (MyChart) Due to this being a telephonic visit, the after visit summary with patients personalized plan was offered to patient via MyChart   Nurse Notes: none

## 2023-03-26 ENCOUNTER — Other Ambulatory Visit (INDEPENDENT_AMBULATORY_CARE_PROVIDER_SITE_OTHER): Payer: Medicare HMO

## 2023-03-26 DIAGNOSIS — I10 Essential (primary) hypertension: Secondary | ICD-10-CM

## 2023-03-26 LAB — COMPREHENSIVE METABOLIC PANEL
ALT: 21 U/L (ref 0–53)
AST: 23 U/L (ref 0–37)
Albumin: 4.3 g/dL (ref 3.5–5.2)
Alkaline Phosphatase: 51 U/L (ref 39–117)
BUN: 13 mg/dL (ref 6–23)
CO2: 26 mEq/L (ref 19–32)
Calcium: 9.7 mg/dL (ref 8.4–10.5)
Chloride: 102 mEq/L (ref 96–112)
Creatinine, Ser: 0.73 mg/dL (ref 0.40–1.50)
GFR: 82.21 mL/min (ref >60.00)
Glucose, Bld: 112 mg/dL — ABNORMAL HIGH (ref 70–99)
Potassium: 4.3 mEq/L (ref 3.5–5.1)
Sodium: 133 mEq/L — ABNORMAL LOW (ref 135–145)
Total Bilirubin: 0.8 mg/dL (ref 0.2–1.2)
Total Protein: 6.9 g/dL (ref 6.0–8.3)

## 2023-03-26 LAB — LIPID PANEL
Cholesterol: 125 mg/dL (ref 0–200)
HDL: 47.5 mg/dL (ref >39.00)
LDL Cholesterol: 62 mg/dL (ref 0–99)
NonHDL: 77.8
Total CHOL/HDL Ratio: 3
Triglycerides: 79 mg/dL (ref 0.0–149.0)
VLDL: 15.8 mg/dL (ref 0.0–40.0)

## 2023-04-02 ENCOUNTER — Other Ambulatory Visit: Payer: Self-pay | Admitting: Family Medicine

## 2023-04-02 ENCOUNTER — Encounter: Payer: Self-pay | Admitting: Family Medicine

## 2023-04-02 ENCOUNTER — Encounter: Payer: Medicare HMO | Admitting: Family Medicine

## 2023-04-02 ENCOUNTER — Ambulatory Visit: Payer: Medicare HMO | Admitting: Family Medicine

## 2023-04-02 VITALS — BP 122/64 | HR 62 | Temp 97.0°F | Ht 67.0 in | Wt 166.0 lb

## 2023-04-02 DIAGNOSIS — I10 Essential (primary) hypertension: Secondary | ICD-10-CM

## 2023-04-02 DIAGNOSIS — Z7189 Other specified counseling: Secondary | ICD-10-CM

## 2023-04-02 DIAGNOSIS — E7800 Pure hypercholesterolemia, unspecified: Secondary | ICD-10-CM | POA: Diagnosis not present

## 2023-04-02 DIAGNOSIS — K219 Gastro-esophageal reflux disease without esophagitis: Secondary | ICD-10-CM | POA: Diagnosis not present

## 2023-04-02 DIAGNOSIS — Z0000 Encounter for general adult medical examination without abnormal findings: Secondary | ICD-10-CM

## 2023-04-02 MED ORDER — LORATADINE 10 MG PO TABS: 10.0000 mg | ORAL_TABLET | Freq: Every day | ORAL | Status: DC

## 2023-04-02 MED ORDER — ROSUVASTATIN CALCIUM 20 MG PO TABS: 20.0000 mg | ORAL_TABLET | Freq: Every day | ORAL | 3 refills | Status: DC

## 2023-04-02 MED ORDER — METOPROLOL TARTRATE 25 MG PO TABS: ORAL_TABLET | ORAL | 3 refills | Status: DC

## 2023-04-02 MED ORDER — LISINOPRIL 20 MG PO TABS: 20.0000 mg | ORAL_TABLET | Freq: Every day | ORAL | 3 refills | Status: DC

## 2023-04-02 MED ORDER — FLUTICASONE PROPIONATE 50 MCG/ACT NA SUSP: 2.0000 | Freq: Every day | NASAL | Status: DC

## 2023-04-02 NOTE — Progress Notes (Signed)
Elevated Cholesterol: Using medications without problems: yes Muscle aches: not usually- rare cramping in the L leg, not with exertion.   Diet compliance: yes Exercise: yes   Prev constipation managed with miralax- hasn't needed med recently.     Hypertension:               Using medication without problems or lightheadedness:  yes Chest pain with exertion:no Edema:no Short of breath:no Labs d/w pt.     GERD controlled with MWF PPI use.    Advance directive- wife designated if patient were incapacitated.   Defer colonoscopy and PSA given his age.  D/w pt.  He agrees. shingrix prev done. tdap 2018 Flu d/w pt PNA up to date.  covid 2021  Memory testing done.  Year/month/day wnl.  3/3 registration and recall.   Can read a watch and do basic math.    Some occ rhinorrhea.  Clear.  No fevers.    Meds, vitals, and allergies reviewed.   PMH and SH reviewed  ROS: Per HPI unless specifically indicated in ROS section   GEN: nad, alert and oriented HEENT: mucous membranes moist, nasal exam w/o rhinorrhea or erythema.   NECK: supple w/o LA CV: rrr. PULM: ctab, no inc wob ABD: soft, +bs EXT: no edema SKIN: no acute rash  30 minutes were devoted to patient care in this encounter (this includes time spent reviewing the patient's file/history, interviewing and examining the patient, counseling/reviewing plan with patient).

## 2023-04-02 NOTE — Patient Instructions (Signed)
Try claritin (not claritin D) for runny nose.   If needed, add on flonase nasal spray.    Both are over the counter.   Take care.  Glad to see you.

## 2023-04-04 NOTE — Assessment & Plan Note (Signed)
  GERD controlled with MWF PPI use.  Continue as is.

## 2023-04-04 NOTE — Assessment & Plan Note (Addendum)
Advance directive- wife designated if patient were incapacitated.   Defer colonoscopy and PSA given his age.  D/w pt.  He agrees. shingrix prev done. tdap 2018 Flu d/w pt PNA up to date.  covid 2021 Memory testing done.  Year/month/day wnl.  3/3 registration and recall.   Can read a watch and do basic math.

## 2023-04-04 NOTE — Assessment & Plan Note (Signed)
Continue crestor 

## 2023-04-04 NOTE — Assessment & Plan Note (Signed)
-  Continue lisinopril metoprolol

## 2023-04-04 NOTE — Assessment & Plan Note (Signed)
Advance directive- wife designated if patient were incapacitated.  

## 2023-05-03 ENCOUNTER — Other Ambulatory Visit: Payer: Self-pay | Admitting: Family Medicine

## 2023-05-21 DIAGNOSIS — I252 Old myocardial infarction: Secondary | ICD-10-CM | POA: Diagnosis not present

## 2023-05-21 DIAGNOSIS — Z7982 Long term (current) use of aspirin: Secondary | ICD-10-CM | POA: Diagnosis not present

## 2023-05-21 DIAGNOSIS — I25119 Atherosclerotic heart disease of native coronary artery with unspecified angina pectoris: Secondary | ICD-10-CM | POA: Diagnosis not present

## 2023-05-21 DIAGNOSIS — K219 Gastro-esophageal reflux disease without esophagitis: Secondary | ICD-10-CM | POA: Diagnosis not present

## 2023-05-21 DIAGNOSIS — J309 Allergic rhinitis, unspecified: Secondary | ICD-10-CM | POA: Diagnosis not present

## 2023-05-21 DIAGNOSIS — E785 Hyperlipidemia, unspecified: Secondary | ICD-10-CM | POA: Diagnosis not present

## 2023-05-21 DIAGNOSIS — Z85828 Personal history of other malignant neoplasm of skin: Secondary | ICD-10-CM | POA: Diagnosis not present

## 2023-05-21 DIAGNOSIS — Z8616 Personal history of COVID-19: Secondary | ICD-10-CM | POA: Diagnosis not present

## 2023-05-21 DIAGNOSIS — Z8249 Family history of ischemic heart disease and other diseases of the circulatory system: Secondary | ICD-10-CM | POA: Diagnosis not present

## 2023-05-21 DIAGNOSIS — I10 Essential (primary) hypertension: Secondary | ICD-10-CM | POA: Diagnosis not present

## 2023-07-08 DIAGNOSIS — H524 Presbyopia: Secondary | ICD-10-CM | POA: Diagnosis not present

## 2023-07-30 DIAGNOSIS — Z0100 Encounter for examination of eyes and vision without abnormal findings: Secondary | ICD-10-CM | POA: Diagnosis not present

## 2023-08-10 DIAGNOSIS — M5414 Radiculopathy, thoracic region: Secondary | ICD-10-CM | POA: Diagnosis not present

## 2023-08-10 DIAGNOSIS — M9902 Segmental and somatic dysfunction of thoracic region: Secondary | ICD-10-CM | POA: Diagnosis not present

## 2023-08-17 DIAGNOSIS — M9902 Segmental and somatic dysfunction of thoracic region: Secondary | ICD-10-CM | POA: Diagnosis not present

## 2023-08-17 DIAGNOSIS — M5414 Radiculopathy, thoracic region: Secondary | ICD-10-CM | POA: Diagnosis not present

## 2023-08-20 DIAGNOSIS — M9902 Segmental and somatic dysfunction of thoracic region: Secondary | ICD-10-CM | POA: Diagnosis not present

## 2023-08-20 DIAGNOSIS — M5414 Radiculopathy, thoracic region: Secondary | ICD-10-CM | POA: Diagnosis not present

## 2023-08-25 DIAGNOSIS — M9902 Segmental and somatic dysfunction of thoracic region: Secondary | ICD-10-CM | POA: Diagnosis not present

## 2023-08-25 DIAGNOSIS — M5414 Radiculopathy, thoracic region: Secondary | ICD-10-CM | POA: Diagnosis not present

## 2023-08-25 NOTE — Progress Notes (Signed)
Barry Small T. Yaminah Clayborn, MD, CAQ Sports Medicine [facility] at [facility] [address] [place], [address]  Phone: [phone]  FAX: [phone]  Barry Small - 89 y.o. male  MRN [id number]  Date of Birth: 1933-11-27  Date: 08/26/2023  PCP: Joaquim Nam, MD  Referral: Joaquim Nam, MD  Chief Complaint  Patient presents with   Shoulder Pain    Left   Subjective:   Barry Small is a 89 y.o. very pleasant male patient with Body mass index is 26.94 kg/m. who presents with the following:  The patient presents with ongoing shoulder pain.  This is on the left side, and he is right-hand dominant.  Was pulling some stumps up, worried that shoulder and tendons are bothering him some.    Right now, he is having a deep dull ache in the shoulder and he has pain when he is trying to sleep at night.  He has not had any prior major shoulder injuries, neck problems, focal weakness, numbness, and no prior dislocations or fractures.  I independently reviewed the patient's left shoulder x-ray, and he has minimal glenohumeral joint osteoarthritis, he does have AC arthropathy.  Review of Systems is noted in the HPI, as appropriate  Objective:   BP (!) 148/82 (BP Location: Right Arm, Patient Position: Sitting, Cuff Size: Large)   Pulse (!) 57   Temp (!) 97.5 F (36.4 C) (Temporal)   Ht 5\' 7"  (1.702 m)   Wt 172 lb (78 kg)   SpO2 96%   BMI 26.94 kg/m   GEN: No acute distress; alert,appropriate. PULM: Breathing comfortably in no respiratory distress PSYCH: Normally interactive.   Shoulder: L Inspection: No muscle wasting or winging Ecchymosis/edema: neg  AC joint, scapula, clavicle: NT Cervical spine: NT, full ROM Spurling's: neg Abduction: full, 4+/5 Flexion: full, 5/5 IR, full, lift-off: 5/5 ER at neutral: full, 5/5 AC crossover: pos Neer: pos Hawkins: pos Drop Test: neg Jobe: pos Supraspinatus insertion: mild-mod T Bicipital  groove: NT Speed's: neg Yergason's: neg Sulcus sign: neg Scapular dyskinesis: none C5-T1 intact  Neuro: Sensation intact Grip 5/5   Laboratory and Imaging Data:  Assessment and Plan:     ICD-10-CM   1. Chronic left shoulder pain  M25.512 triamcinolone acetonide (KENALOG-40) injection 40 mg   G89.29     2. Acute pain of left shoulder  M25.512 DG Shoulder Left     Acute on chronic left-sided shoulder pain.  I do think the patient is having some impingement with some rotator cuff tendinopathy.  With his age, my suspicion is that he has a more prevalent arthritis of the glenohumeral joint than is appreciated on the plain x-ray.  I am going to have him work on his motion and some strengthening, and we will do an injection today in the shoulder to try to calm it down.  Intraarticular Shoulder Aspiration/Injection Procedure Note Barry Small 1933-10-16 Date of procedure: 08/26/2023  Procedure: Large Joint Aspiration / Injection of Shoulder, Intraarticular, L Indications: Pain  Procedure Details Verbal consent was obtained from the patient. Risks explained and contrasted with benefits and alternatives. Patient prepped with Chloraprep and Ethyl Chloride used for anesthesia. An intraarticular shoulder injection was performed using the posterior approach; needle placed into joint capsule without difficulty. The patient tolerated the procedure well and had decreased pain post injection. No complications. Injection: 9 cc of Lidocaine 1% and 1 mL Kenalog 40 mg. Needle: 21 gauge, 2 inch   Medication  Management during today's office visit: Meds ordered this encounter  Medications   triamcinolone acetonide (KENALOG-40) injection 40 mg   Medications Discontinued During This Encounter  Medication Reason   polyethylene glycol powder (GLYCOLAX/MIRALAX) powder Completed Course   loratadine (CLARITIN) 10 MG tablet Completed Course   fluticasone (FLONASE) 50 MCG/ACT nasal spray Completed  Course    Orders placed today for conditions managed today: Orders Placed This Encounter  Procedures   DG Shoulder Left    Disposition: No follow-ups on file.  [employer] One speech-to-text software was used for transcription in this dictation.  Possible transcriptional errors can occur using Animal nutritionist.   Signed,  Elpidio Galea. [place] Oborn, MD   Outpatient Encounter Medications as of 08/26/2023  Medication Sig   aspirin 81 MG EC tablet Take 81 mg by mouth daily.   Cholecalciferol (VITAMIN D3) 2000 units TABS Take by mouth daily.   Cyanocobalamin (VITAMIN B-12) 5000 MCG SUBL Place under the tongue daily.   lisinopril (ZESTRIL) 20 MG tablet Take 1 tablet (20 mg total) by mouth daily.   metoprolol tartrate (LOPRESSOR) 25 MG tablet TAKE 0.5 TABLETS BY MOUTH 2 TIMES DAILY.   Multiple Vitamins-Minerals (ZINC PO) Take 1,000 mg by mouth daily.   nitroGLYCERIN (NITROSTAT) 0.4 MG SL tablet DISSOLVE ONE TABLET UNDER TONGUE EVERY 5 MINUTES AS NEEDED FOR CHEST PAIN   omeprazole (PRILOSEC) 20 MG capsule TAKE 1 CAPSULE (20 MG TOTAL) BY MOUTH EVERY MONDAY, WEDNESDAY, AND FRIDAY.   rosuvastatin (CRESTOR) 20 MG tablet Take 1 tablet (20 mg total) by mouth daily.   zinc gluconate 50 MG tablet Take 50 mg by mouth daily.   [DISCONTINUED] fluticasone (FLONASE) 50 MCG/ACT nasal spray Place 2 sprays into both nostrils daily.   [DISCONTINUED] loratadine (CLARITIN) 10 MG tablet Take 1 tablet (10 mg total) by mouth daily.   [DISCONTINUED] polyethylene glycol powder (GLYCOLAX/MIRALAX) powder Take 17 g by mouth daily as needed for mild constipation.   [EXPIRED] triamcinolone acetonide (KENALOG-40) injection 40 mg    No facility-administered encounter medications on file as of 08/26/2023.

## 2023-08-26 ENCOUNTER — Encounter: Payer: Self-pay | Admitting: Family Medicine

## 2023-08-26 ENCOUNTER — Ambulatory Visit (INDEPENDENT_AMBULATORY_CARE_PROVIDER_SITE_OTHER)
Admission: RE | Admit: 2023-08-26 | Discharge: 2023-08-26 | Disposition: A | Discharge status: Home / Self Care | Payer: Medicare HMO | Source: Ambulatory Visit | Attending: Family Medicine | Admitting: Family Medicine

## 2023-08-26 ENCOUNTER — Ambulatory Visit: Payer: Medicare HMO | Admitting: Family Medicine

## 2023-08-26 VITALS — BP 148/82 | HR 57 | Temp 97.5°F | Ht 67.0 in | Wt 172.0 lb

## 2023-08-26 DIAGNOSIS — G8929 Other chronic pain: Secondary | ICD-10-CM

## 2023-08-26 DIAGNOSIS — M25512 Pain in left shoulder: Secondary | ICD-10-CM

## 2023-08-26 DIAGNOSIS — M19012 Primary osteoarthritis, left shoulder: Secondary | ICD-10-CM | POA: Diagnosis not present

## 2023-08-26 DIAGNOSIS — J929 Pleural plaque without asbestos: Secondary | ICD-10-CM | POA: Diagnosis not present

## 2023-08-26 MED ORDER — TRIAMCINOLONE ACETONIDE 40 MG/ML IJ SUSP
40.0000 mg | Freq: Once | INTRAMUSCULAR | Status: AC
Start: 2023-08-26 — End: 2023-08-26
  Administered 2023-08-26: 40 mg via INTRA_ARTICULAR

## 2023-09-01 DIAGNOSIS — M9902 Segmental and somatic dysfunction of thoracic region: Secondary | ICD-10-CM | POA: Diagnosis not present

## 2023-09-01 DIAGNOSIS — M5414 Radiculopathy, thoracic region: Secondary | ICD-10-CM | POA: Diagnosis not present

## 2023-09-14 DIAGNOSIS — H02831 Dermatochalasis of right upper eyelid: Secondary | ICD-10-CM | POA: Diagnosis not present

## 2023-09-14 DIAGNOSIS — H26492 Other secondary cataract, left eye: Secondary | ICD-10-CM | POA: Diagnosis not present

## 2023-09-14 DIAGNOSIS — H18413 Arcus senilis, bilateral: Secondary | ICD-10-CM | POA: Diagnosis not present

## 2023-09-14 DIAGNOSIS — Z961 Presence of intraocular lens: Secondary | ICD-10-CM | POA: Diagnosis not present

## 2023-09-17 DIAGNOSIS — M9902 Segmental and somatic dysfunction of thoracic region: Secondary | ICD-10-CM | POA: Diagnosis not present

## 2023-09-17 DIAGNOSIS — M5414 Radiculopathy, thoracic region: Secondary | ICD-10-CM | POA: Diagnosis not present

## 2023-10-12 DIAGNOSIS — H26491 Other secondary cataract, right eye: Secondary | ICD-10-CM | POA: Diagnosis not present

## 2023-10-26 DIAGNOSIS — M9902 Segmental and somatic dysfunction of thoracic region: Secondary | ICD-10-CM | POA: Diagnosis not present

## 2023-10-26 DIAGNOSIS — M5414 Radiculopathy, thoracic region: Secondary | ICD-10-CM | POA: Diagnosis not present

## 2023-12-07 DIAGNOSIS — M9902 Segmental and somatic dysfunction of thoracic region: Secondary | ICD-10-CM | POA: Diagnosis not present

## 2023-12-07 DIAGNOSIS — M5414 Radiculopathy, thoracic region: Secondary | ICD-10-CM | POA: Diagnosis not present

## 2024-01-08 ENCOUNTER — Telehealth: Payer: Self-pay | Admitting: Family Medicine

## 2024-01-08 NOTE — Telephone Encounter (Signed)
 Spoke with pt's wife, Francoise Ishihara. Pt has been scheduled for 03/31/24 at 0815.

## 2024-01-08 NOTE — Telephone Encounter (Signed)
 Copied from CRM [id number]. Topic: Clinical - Lab/Test Results >> Jan 08, 2024  2:38 PM Dyann Glaser G wrote: Reason for CRM: THE PATIENT WOULD LIKE LABS DONE A WEEK APPT FOR PHYSICAL (07/31).

## 2024-03-17 ENCOUNTER — Ambulatory Visit: Payer: Medicare HMO

## 2024-03-17 VITALS — Ht 67.0 in | Wt 172.0 lb

## 2024-03-17 DIAGNOSIS — Z0000 Encounter for general adult medical examination without abnormal findings: Secondary | ICD-10-CM | POA: Diagnosis not present

## 2024-03-17 NOTE — Patient Instructions (Addendum)
 Barry Small , Thank you for taking time out of your busy schedule to complete your Annual Wellness Visit with me. I enjoyed our conversation and look forward to speaking with you again next year. I, as well as your care team,  appreciate your ongoing commitment to your health goals. Please review the following plan we discussed and let me know if I can assist you in the future. Your Game plan/ To Do List     Follow up Visits: Next Medicare AWV with our clinical staff: 03/20/25 @ 9:30am televisit   Have you seen your provider in the last 6 months (3 months if uncontrolled diabetes)? No Next Office Visit with your provider: 04/07/24 physical  Clinician Recommendations:  Aim for 30 minutes of exercise or brisk walking, 6-8 glasses of water, and 5 servings of fruits and vegetables each day.       This is a list of the screening recommended for you and due dates:  Health Maintenance  Topic Date Due   COVID-19 Vaccine (7 - 2024-25 season) 05/10/2023   Flu Shot  04/08/2024   Medicare Annual Wellness Visit  03/17/2025   DTaP/Tdap/Td vaccine (3 - Td or Tdap) 11/02/2026   Pneumococcal Vaccine for age over 29  Completed   Zoster (Shingles) Vaccine  Completed   Hepatitis B Vaccine  Aged Out   HPV Vaccine  Aged Out   Meningitis B Vaccine  Aged Out    Advanced directives: (Copy Requested) Please bring a copy of your health care power of attorney and living will to the office to be added to your chart at your convenience. You can mail to [facility] [address] [place], [place] [address] or email to [email or web address] Advance Care Planning is important because it:  [x]  Makes sure you receive the medical care that is consistent with your values, goals, and preferences  [x]  It provides guidance to your family and loved ones and reduces their decisional burden about whether or not they are making the right decisions based on your wishes.  Follow the link provided in your  after visit summary or read over the paperwork we have mailed to you to help you started getting your Advance Directives in place. If you need assistance in completing these, please reach out to us  so that we can help you!

## 2024-03-17 NOTE — Progress Notes (Addendum)
 Subjective:   Barry Small is a 89 y.o. who presents for a Medicare Wellness preventive visit.  As a reminder, Annual Wellness Visits don't include a physical exam, and some assessments may be limited, especially if this visit is performed virtually. We may recommend an in-person follow-up visit with your provider if needed.  Visit Complete: Virtual I connected with  Barry Small on 03/17/24 by a audio enabled telemedicine application and verified that I am speaking with the correct person using two identifiers.  Patient Location: Home  Provider Location: Home Office  I discussed the limitations of evaluation and management by telemedicine. The patient expressed understanding and agreed to proceed.  Vital Signs: Because this visit was a virtual/telehealth visit, some criteria may be missing or patient reported. Any vitals not documented were not able to be obtained and vitals that have been documented are patient reported.  VideoDeclined- This patient declined Librarian, academic. Therefore the visit was completed with audio only.  Persons Participating in Visit: Patient. And wife Barry Small (mostly)  AWV Questionnaire: No: Patient Medicare AWV questionnaire was not completed prior to this visit.  Cardiac Risk Factors include: advanced age (>51men, >78 women);hypertension;dyslipidemia;male gender     Objective:    Today's Vitals   03/17/24 1304  Weight: 172 lb (78 kg)  Height: 5' 7 (1.702 m)   Body mass index is 26.94 kg/m.     03/17/2024    1:14 PM 03/17/2023   11:25 AM 03/14/2022    8:32 AM 03/13/2021    8:39 AM 10/26/2020    3:23 PM 02/11/2019    8:49 AM 01/21/2018    2:25 PM  Advanced Directives  Does Patient Have a Medical Advance Directive? Yes Yes No Yes No Yes Yes   Type of Estate agent of [place];Living will Healthcare Power of [place];Living will  Healthcare Power of [place];Living will  Healthcare Power of  [place];Living will Healthcare Power of [place];Living will  Copy of Healthcare Power of Attorney in Chart? No - copy requested No - copy requested  No - copy requested  No - copy requested  No - copy requested   Would patient like information on creating a medical advance directive?   No - Patient declined  No - Patient declined       Data saved with a previous flowsheet row definition    Current Medications (verified) Outpatient Encounter Medications as of 03/17/2024  Medication Sig   aspirin 81 MG EC tablet Take 81 mg by mouth daily.   Cholecalciferol (VITAMIN D3) 2000 units TABS Take by mouth daily.   Cyanocobalamin (VITAMIN B-12) 5000 MCG SUBL Place under the tongue daily.   lisinopril  (ZESTRIL ) 20 MG tablet Take 1 tablet (20 mg total) by mouth daily.   metoprolol  tartrate (LOPRESSOR ) 25 MG tablet TAKE 0.5 TABLETS BY MOUTH 2 TIMES DAILY.   Multiple Vitamins-Minerals (ZINC PO) Take 1,000 mg by mouth daily.   nitroGLYCERIN  (NITROSTAT ) 0.4 MG SL tablet DISSOLVE ONE TABLET UNDER TONGUE EVERY 5 MINUTES AS NEEDED FOR CHEST PAIN   omeprazole  (PRILOSEC) 20 MG capsule TAKE 1 CAPSULE (20 MG TOTAL) BY MOUTH EVERY MONDAY, WEDNESDAY, AND FRIDAY.   rosuvastatin  (CRESTOR ) 20 MG tablet Take 1 tablet (20 mg total) by mouth daily.   zinc gluconate 50 MG tablet Take 50 mg by mouth daily.   No facility-administered encounter medications on file as of 03/17/2024.    Allergies (verified) Atorvastatin, Morphine, and Simvastatin   History: Past Medical History:  Diagnosis Date   Barrett's esophagus    Colon polyps 04/2003   type unknown    Coronary artery disease    Elevated bilirubin    Elevated glucose    GERD (gastroesophageal reflux disease)    Hyperlipidemia    Hypertension    Myocardial infarction [facility])    Pneumonia 2014   Past Surgical History:  Procedure Laterality Date   BELPHAROPTOSIS REPAIR  08/2018   Cardiolite  12/2001   h/o vessel infarct EF 66%   CATARACT EXTRACTION W/  INTRAOCULAR LENS  IMPLANT, BILATERAL  09/2018   CHOLECYSTECTOMY  09/2001   COLONOSCOPY  11/15/2008   Mild divertics, 5 yrs, Dr. Aneita   COLONOSCOPY W/ POLYPECTOMY  05/09/2003   x multiple, Dr. Viktoria   CORONARY ANGIOPLASTY  2001   x 2, Dr.Pulsipher   CORONARY ANGIOPLASTY WITH STENT PLACEMENT  12/05/2005   CORONARY ARTERY BYPASS GRAFT  1989   [facility]   ESOPHAGOGASTRODUODENOSCOPY  05/09/2003   Barrett's   ESOPHAGOGASTRODUODENOSCOPY  11/17/08   Barrett's Esoph [facility] (Dr. Aneita)  2 years   EYE SURGERY  09/2018   cataract extraction   [facility]  03/28-03/31/2007   Chest pain   NASAL SINUS SURGERY  06/23/2008   Dr.Clark   NOSE SURGERY  06/23/2008   Dr. Gretta   Stress Myoview   09/30/2004   Normal EF 70%   Stress Myoview   02/11/2006   No change, c/w 09/2004   Stress Myoview   02/22/2008   Abnormal w/prior inf infarct  No signif ischemia   Family History  Problem Relation Age of Onset   Heart disease Father    Pneumonia Mother    Colon cancer Neg Hx    Prostate cancer Neg Hx    Social History   Socioeconomic History   Marital status: Married    Spouse name: Not on file   Number of children: 2   Years of education: Not on file   Highest education level: Not on file  Occupational History   Occupation: Retired Science writer: [employer]   Occupation: Part time    Employer: [employer]  Tobacco Use   Smoking status: Never   Smokeless tobacco: Never  Vaping Use   Vaping status: Never Used  Substance and Sexual Activity   Alcohol use: Yes    Comment: occassional beer-socially   Drug use: No   Sexual activity: Not Currently  Other Topics Concern   Not on file  Social History Narrative   Caffeine use:  1   Retired from working part-time at [employer] 2023   Retired Engineer, structural.  Married to [employer] in 1969   Social Drivers of Health   Financial Resource Strain: Low Risk  (03/17/2024)   Overall Financial Resource Strain (CARDIA)    Difficulty of Paying Living  Expenses: Not very hard  Food Insecurity: No Food Insecurity (03/17/2024)   Hunger Vital Sign    Worried About Running Out of Food in the Last Year: Never true    Ran Out of Food in the Last Year: Never true  Transportation Needs: No Transportation Needs (03/17/2024)   PRAPARE - Administrator, Civil Service (Medical): No    Lack of Transportation (Non-Medical): No  Physical Activity: Insufficiently Active (03/17/2024)   Exercise Vital Sign    Days of Exercise per Week: 3 days    Minutes of Exercise per Session: 40 min  Stress: No Stress Concern Present (03/17/2024)   [employer] of  Occupational Health - Occupational Stress Questionnaire    Feeling of Stress: Not at all  Social Connections: Socially Integrated (03/17/2024)   Social Connection and Isolation Panel    Frequency of Communication with Friends and Family: More than three times a week    Frequency of Social Gatherings with Friends and Family: More than three times a week    Attends Religious Services: More than 4 times per year    Active Member of [employer] or Organizations: Yes    Attends Engineer, structural: More than 4 times per year    Marital Status: Married    Tobacco Counseling Counseling given: Not Answered    Clinical Intake:  Pre-visit preparation completed: Yes  Pain : No/denies pain     BMI - recorded: 26.94 Nutritional Status: BMI 25 -29 Overweight Nutritional Risks: None Diabetes: No  No results found for: HGBA1C   How often do you need to have someone help you when you read instructions, pamphlets, or other written materials from your doctor or pharmacy?: 1 - Never  Interpreter Needed?: No  Comments: lives with wife Information entered by :: B.Florena Kozma,LPN   Activities of Daily Living     03/17/2024    1:14 PM  In your present state of health, do you have any difficulty performing the following activities:  Hearing? 1  Vision? 0  Difficulty concentrating or  making decisions? 1  Walking or climbing stairs? 0  Dressing or bathing? 0  Doing errands, shopping? 0  Preparing Food and eating ? N  Using the Toilet? N  In the past six months, have you accidently leaked urine? N  Do you have problems with loss of bowel control? N  Managing your Medications? N  Managing your Finances? N  Housekeeping or managing your Housekeeping? Y    Patient Care Team: [name], MD as PCP - General Gollan, Evalene PARAS, MD as Consulting Physician (Cardiology) Fate Morna SAILOR, [facility] (Inactive) as Pharmacist (Pharmacist) Pa, [facility]  I have updated your Care Teams any recent Medical Services you may have received from other providers in the past year.     Assessment:   This is a routine wellness examination for Barry Small.  Hearing/Vision screen Hearing Screening - Comments:: Pt wife says his hearing is not good: just got new hearing aids Vision Screening - Comments:: Pt wife says pt sees well:no glasses [facility] Leonce   Goals Addressed             This Visit's Progress    DIET - EAT MORE FRUITS AND VEGETABLES   On track    03/17/24-continue     Manage My Medicine   On track    Timeframe:  Long-Range Goal Priority:  Medium Start Date:  10/16/21                           Expected End Date:  10/16/22                     Follow Up Date Feb 2024   - call for medicine refill 2 or 3 days before it runs out - call if I am sick and can't take my medicine - keep a list of all the medicines I take; vitamins and herbals too    Why is this important?   These steps will help you keep on track with your medicines.   Notes:  COMPLETED: Patient Stated       03/13/2021, I will continue to go to the gym 2-3 days a week for 1 1/2- 2 hours.      Patient Stated   On track    03/17/24-Stay active       Depression Screen     03/17/2024    1:11 PM 04/02/2023    2:06 PM 03/17/2023   11:23 AM 03/14/2022    8:30 AM 03/13/2021    8:42  AM 02/11/2019    8:48 AM 01/21/2018    1:59 PM  PHQ 2/9 Scores  PHQ - 2 Score 0 0 0 0 0 0 0  PHQ- 9 Score  0  0 0 0 0    Fall Risk     03/17/2024    1:08 PM 04/02/2023    2:06 PM 03/17/2023   11:18 AM 03/14/2022    8:32 AM 03/13/2021    8:41 AM  Fall Risk   Falls in the past year? 0 0 0 0 1  Number falls in past yr: 0 0 0 0 0  Injury with Fall? 0 0 0 0 0  Risk for fall due to : No Fall Risks No Fall Risks No Fall Risks No Fall Risks Medication side effect  Follow up Education provided;Falls prevention discussed Falls evaluation completed Falls prevention discussed;Falls evaluation completed Falls evaluation completed  Falls evaluation completed;Falls prevention discussed      Data saved with a previous flowsheet row definition    MEDICARE RISK AT HOME:  Medicare Risk at Home Any stairs in or around the home?: Yes If so, are there any without handrails?: Yes Home free of loose throw rugs in walkways, pet beds, electrical cords, etc?: Yes Adequate lighting in your home to reduce risk of falls?: Yes Life alert?: No Use of a cane, walker or w/c?: No Grab bars in the bathroom?: Yes Shower chair or bench in shower?: No Elevated toilet seat or a handicapped toilet?: Yes  TIMED UP AND GO:  Was the test performed?  No  Cognitive Function: 6CIT completed    03/13/2021    8:50 AM 02/11/2019    8:49 AM 01/21/2018    1:59 PM 12/02/2016   12:59 PM  MMSE - Mini Mental State Exam  Orientation to time 5 5 5 5    Orientation to Place 5 5 5 5    Registration 3 3 3 3    Attention/ Calculation 5 0 0 0   Recall 3 3 3 3    Language- name 2 objects  0 0 0   Language- repeat 1 1 1 1   Language- follow 3 step command  0 3 3   Language- read & follow direction  0 0 0   Write a sentence  0 0 0   Copy design  0 0 0   Total score  17 20 20       Data saved with a previous flowsheet row definition        03/17/2024    1:20 PM 03/17/2023   11:26 AM 03/14/2022    8:33 AM  6CIT Screen  What Year? 0 points 0  points 0 points  What month? 0 points 0 points 0 points  What time? 0 points 0 points 0 points  Count back from 20 0 points 2 points 0 points  Months in reverse 0 points 0 points 0 points  Repeat phrase 2 points 0 points 0 points  Total Score 2 points 2 points 0  points    Immunizations Immunization History  Administered Date(s) Administered   Fluad Quad(high Dose 65+) 06/18/2020, 05/07/2022   Influenza Whole 05/09/2010   Influenza, High Dose Seasonal PF 05/28/2014, 06/01/2017, 05/24/2019, 05/08/2021, 05/29/2023   Influenza-Unspecified 07/14/2015, 06/06/2016, 04/30/2018   PFIZER Comirnaty(Gray Top)Covid-19 Tri-Sucrose Vaccine 02/24/2021   PFIZER(Purple Top)SARS-COV-2 Vaccination 09/15/2019, 10/06/2019, 06/18/2020   PNEUMOCOCCAL CONJUGATE-20 02/24/2021   Pfizer Covid-19 Vaccine Bivalent Booster 53yrs & up 06/30/2021   Pfizer(Comirnaty)Fall Seasonal Vaccine 12 years and older 07/04/2022   Pneumococcal Conjugate-13 09/08/2014   Pneumococcal Polysaccharide-23 07/07/2007, 07/22/2017   Respiratory Syncytial Virus Vaccine,Recomb Aduvanted(Arexvy) 07/04/2022   Td 06/27/2004   Tdap 11/02/2016   Zoster Recombinant(Shingrix) 05/23/2018, 08/12/2018   Zoster, Live 07/14/2007    Screening Tests Health Maintenance  Topic Date Due   COVID-19 Vaccine (7 - 2024-25 season) 05/10/2023   INFLUENZA VACCINE  04/08/2024   Medicare Annual Wellness (AWV)  03/17/2025   DTaP/Tdap/Td (3 - Td or Tdap) 11/02/2026   Pneumococcal Vaccine: 50+ Years  Completed   Zoster Vaccines- Shingrix  Completed   Hepatitis B Vaccines  Aged Out   HPV VACCINES  Aged Out   Meningococcal B Vaccine  Aged Out    Health Maintenance  Health Maintenance Due  Topic Date Due   COVID-19 Vaccine (7 - 2024-25 season) 05/10/2023   Health Maintenance Items Addressed: None needed at this time  Additional Screening:  Vision Screening: Recommended annual ophthalmology exams for early detection of glaucoma and other disorders of  the eye. Would you like a referral to an eye doctor? No    Dental Screening: Recommended annual dental exams for proper oral hygiene  Community Resource Referral / Chronic Care Management: CRR required this visit?  No   CCM required this visit?  No   Plan:    I have personally reviewed and noted the following in the patient's chart:   Medical and social history Use of alcohol, tobacco or illicit drugs  Current medications and supplements including opioid prescriptions. Patient is not currently taking opioid prescriptions. Functional ability and status Nutritional status Physical activity Advanced directives List of other physicians Hospitalizations, surgeries, and ER visits in previous 12 months Vitals Screenings to include cognitive, depression, and falls Referrals and appointments  In addition, I have reviewed and discussed with patient certain preventive protocols, quality metrics, and best practice recommendations. A written personalized care plan for preventive services as well as general preventive health recommendations were provided to patient.   Erminio LITTIE Saris, LPN   2/89/7974   After Visit Summary: (MyChart) Due to this being a telephonic visit, the after visit summary with patients personalized plan was offered to patient via MyChart   Notes: Please refer to Routing Comments.

## 2024-03-22 ENCOUNTER — Ambulatory Visit: Admitting: Cardiovascular Disease

## 2024-03-24 ENCOUNTER — Other Ambulatory Visit: Payer: Self-pay | Admitting: Family Medicine

## 2024-03-24 DIAGNOSIS — R739 Hyperglycemia, unspecified: Secondary | ICD-10-CM

## 2024-03-24 DIAGNOSIS — I10 Essential (primary) hypertension: Secondary | ICD-10-CM

## 2024-03-25 ENCOUNTER — Ambulatory Visit: Payer: Self-pay | Admitting: Family Medicine

## 2024-03-25 ENCOUNTER — Other Ambulatory Visit

## 2024-03-25 DIAGNOSIS — I10 Essential (primary) hypertension: Secondary | ICD-10-CM | POA: Diagnosis not present

## 2024-03-25 DIAGNOSIS — R739 Hyperglycemia, unspecified: Secondary | ICD-10-CM

## 2024-03-25 LAB — COMPREHENSIVE METABOLIC PANEL WITH GFR
ALT: 18 U/L (ref 0–53)
AST: 23 U/L (ref 0–37)
Albumin: 4.1 g/dL (ref 3.5–5.2)
Alkaline Phosphatase: 65 U/L (ref 39–117)
BUN: 15 mg/dL (ref 6–23)
CO2: 27 meq/L (ref 19–32)
Calcium: 9.2 mg/dL (ref 8.4–10.5)
Chloride: 98 meq/L (ref 96–112)
Creatinine, Ser: 0.65 mg/dL (ref 0.40–1.50)
GFR: 84.55 mL/min (ref >60.00)
Glucose, Bld: 105 mg/dL — ABNORMAL HIGH (ref 70–99)
Potassium: 4.5 meq/L (ref 3.5–5.1)
Sodium: 131 meq/L — ABNORMAL LOW (ref 135–145)
Total Bilirubin: 0.8 mg/dL (ref 0.2–1.2)
Total Protein: 6.9 g/dL (ref 6.0–8.3)

## 2024-03-25 LAB — LIPID PANEL
Cholesterol: 110 mg/dL (ref 0–200)
HDL: 45.3 mg/dL (ref >39.00)
LDL Cholesterol: 56 mg/dL (ref 0–99)
NonHDL: 64.82
Total CHOL/HDL Ratio: 2
Triglycerides: 45 mg/dL (ref 0.0–149.0)
VLDL: 9 mg/dL (ref 0.0–40.0)

## 2024-03-25 LAB — HEMOGLOBIN A1C: Hgb A1c MFr Bld: 5.8 % (ref 4.6–6.5)

## 2024-03-31 ENCOUNTER — Other Ambulatory Visit: Payer: Self-pay | Admitting: Family Medicine

## 2024-03-31 ENCOUNTER — Other Ambulatory Visit

## 2024-04-01 NOTE — Progress Notes (Signed)
 Cardiology Office Note  Date:  04/04/2024   ID:  Jevin, Camino Sep 02, 1934, MRN [id number]  PCP:  [name], MD   Chief Complaint  Patient presents with   12 month follow up     Doing well.     HPI:  Mr. Seaman is a pleasant 89 year old gentleman with a history of  coronary artery disease,  4 vessel bypass in 1989 at [facility],  stenting in 2007 after presenting with epigastric and chest discomfort, Hyperlipidemia nonsmoker presenting for routine followup of his coronary artery disease  Last seen in clinic by myself January 2024 Active mowing the yard, Spending lots of time at 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  This morning rushed to primary care thought he had an appointment then rushed to our office  Lab work reviewed  total cholesterol 121 LDL 63 Sodium 131 creatinine 0.71  Was in the emergency room February 2022 with fever and dizziness, dehydration Noncardiac issues  covid sept 2020  EKG personally reviewed by myself on todays visit EKG Interpretation Date/Time:  Monday April 04 2024 09:30:50 EDT Ventricular Rate:  70 PR Interval:  204 QRS Duration:  94 QT Interval:  428 QTC Calculation: 462 R Axis:   -8  Text Interpretation: Sinus rhythm with frequent Premature ventricular complexes in a pattern of bigeminy When compared with ECG of 18-Sep-2021 08:26, Premature ventricular complexes are now Present PR interval has decreased Criteria for Inferior infarct are no longer Present Nonspecific T wave abnormality now evident in Lateral leads Confirmed by Perla Lye [id number]) on 04/04/2024 9:46:32 AM   Previous stress Myoview  test 2021  Nuclear stress 11/2017, no significant ischemia Perfusion abnormality of the basal inferior wall to mid to distal region consistent with previous inferior wall MI Inferior wall  hypokinesis,  EF estimated at 47% No EKG changes concerning for ischemia at peak stress or in recovery. Resting EKG with old inferior MI   Previous pneumonia in February 2014   His last stress nuclear study was in May 2017. EF was normal with an old inferior wall infarct scar. There was no ischemia.    PMH:   has a past medical history of Barrett's esophagus, Colon polyps (04/2003), Coronary artery disease, Elevated bilirubin, Elevated glucose, GERD (gastroesophageal reflux disease), Hyperlipidemia, Hypertension, Myocardial infarction (HCC), and Pneumonia (2014).  PSH:    Past Surgical History:  Procedure Laterality Date   BELPHAROPTOSIS REPAIR  08/2018   Cardiolite  12/2001   h/o vessel infarct EF 66%   CATARACT EXTRACTION W/ INTRAOCULAR LENS  IMPLANT, BILATERAL  09/2018   CHOLECYSTECTOMY  09/2001   COLONOSCOPY  11/15/2008   Mild divertics, 5 yrs, Dr. Aneita   COLONOSCOPY W/ POLYPECTOMY  05/09/2003   x multiple, Dr. Viktoria   CORONARY ANGIOPLASTY  2001   x 2, Dr.Pulsipher   CORONARY ANGIOPLASTY WITH STENT PLACEMENT  12/05/2005   CORONARY ARTERY BYPASS GRAFT  1989   [facility]   ESOPHAGOGASTRODUODENOSCOPY  05/09/2003   Barrett's   ESOPHAGOGASTRODUODENOSCOPY  11/17/08   Barrett's Esoph [facility] (Dr. Aneita)  2 years   EYE SURGERY  09/2018   cataract extraction   [facility]  03/28-03/31/2007   Chest pain   NASAL SINUS SURGERY  06/23/2008   Dr.Clark   NOSE SURGERY  06/23/2008   Dr. Gretta   Stress Myoview   09/30/2004  Normal EF 70%   Stress Myoview   02/11/2006   No change, c/w 09/2004   Stress Myoview   02/22/2008   Abnormal w/prior inf infarct  No signif ischemia    Current Outpatient Medications  Medication Sig Dispense Refill   aspirin 81 MG EC tablet Take 81 mg by mouth daily.     Cholecalciferol (VITAMIN D3) 2000 units TABS Take by mouth daily.     Cyanocobalamin (VITAMIN B-12) 5000 MCG SUBL Place under the tongue daily.     lisinopril  (ZESTRIL ) 20 MG tablet Take 1 tablet  (20 mg total) by mouth daily. 90 tablet 3   metoprolol  tartrate (LOPRESSOR ) 25 MG tablet TAKE 0.5 TABLETS BY MOUTH 2 TIMES DAILY. 90 tablet 3   Multiple Vitamins-Minerals (ZINC PO) Take 1,000 mg by mouth daily.     nitroGLYCERIN  (NITROSTAT ) 0.4 MG SL tablet DISSOLVE ONE TABLET UNDER TONGUE EVERY 5 MINUTES AS NEEDED FOR CHEST PAIN 25 tablet 0   omeprazole  (PRILOSEC) 20 MG capsule TAKE 1 CAPSULE (20 MG TOTAL) BY MOUTH EVERY MONDAY, WEDNESDAY, AND FRIDAY. 45 capsule 2   rosuvastatin  (CRESTOR ) 20 MG tablet TAKE 1 TABLET BY MOUTH EVERY DAY 90 tablet 0   zinc gluconate 50 MG tablet Take 50 mg by mouth daily.     No current facility-administered medications for this visit.    Allergies:   Atorvastatin, Morphine, and Simvastatin   Social History:  The patient  reports that he has never smoked. He has never used smokeless tobacco. He reports current alcohol use. He reports that he does not use drugs.   Family History:   family history includes Heart disease in his father; Pneumonia in his mother.   Review of Systems: Review of Systems  Constitutional: Negative.   Respiratory: Negative.    Cardiovascular: Negative.   Gastrointestinal: Negative.   Musculoskeletal: Negative.   Neurological: Negative.   Psychiatric/Behavioral: Negative.    All other systems reviewed and are negative.   PHYSICAL EXAM: VS:  BP (!) 160/70 (BP Location: Left Arm, Patient Position: Sitting, Cuff Size: Normal)   Pulse 70   Ht 5' 7 (1.702 m)   Wt 165 lb (74.8 kg)   SpO2 98%   BMI 25.84 kg/m  , BMI Body mass index is 25.84 kg/m. Constitutional:  oriented to person, place, and time. No distress.  HENT:  Head: Grossly normal Eyes:  no discharge. No scleral icterus.  Neck: No JVD, no carotid bruits  Cardiovascular: Regular rate and rhythm, no murmurs appreciated Pulmonary/Chest: Clear to auscultation bilaterally, no wheezes or rales Abdominal: Soft.  no distension.  no tenderness.  Musculoskeletal: Normal  range of motion Neurological:  normal muscle tone. Coordination normal. No atrophy Skin: Skin warm and dry Psychiatric: normal affect, pleasant   Recent Labs: 03/25/2024: ALT 18; BUN 15; Creatinine, Ser 0.65; Potassium 4.5; Sodium 131    Lipid Panel Lab Results  Component Value Date   CHOL 110 03/25/2024   HDL 45.30 03/25/2024   LDLCALC 56 03/25/2024   TRIG 45.0 03/25/2024    Wt Readings from Last 3 Encounters:  04/04/24 165 lb (74.8 kg)  03/17/24 172 lb (78 kg)  08/26/23 172 lb (78 kg)     ASSESSMENT AND PLAN:  Essential hypertension  Initial blood pressure elevated, normalized on my recheck No changes made  Frequent PVCs Denies symptoms, has appreciated low heartbeat on home monitoring EKG in bigeminal pattern on EKG today, was rushing this morning On auscultation PVCs resolved 3 days Zio monitor ordered to look  at [facility] burden - Will update echocardiogram, last was 9 years ago  Atherosclerosis of native coronary artery without angina pectoris, unspecified whether native or transplanted heart - Active at baseline, Old inferior MI Last stress test 2021, no ischemia Denies chest pain concerning for angina  HYPERCHOLESTEROLEMIA Cholesterol is at goal on the current lipid regimen. No changes to the medications were made.   Orders Placed This Encounter  Procedures   EKG 12-Lead    Signed, Velinda Lunger, M.D., Ph.D. 04/04/2024  [facility] [place], [place] [phone]

## 2024-04-04 ENCOUNTER — Ambulatory Visit

## 2024-04-04 ENCOUNTER — Ambulatory Visit: Attending: Cardiovascular Disease | Admitting: Cardiovascular Disease

## 2024-04-04 ENCOUNTER — Encounter: Payer: Self-pay | Admitting: Cardiovascular Disease

## 2024-04-04 VITALS — BP 130/60 | HR 70 | Ht 67.0 in | Wt 165.0 lb

## 2024-04-04 DIAGNOSIS — Z951 Presence of aortocoronary bypass graft: Secondary | ICD-10-CM

## 2024-04-04 DIAGNOSIS — I10 Essential (primary) hypertension: Secondary | ICD-10-CM

## 2024-04-04 DIAGNOSIS — E7800 Pure hypercholesterolemia, unspecified: Secondary | ICD-10-CM

## 2024-04-04 DIAGNOSIS — I25118 Atherosclerotic heart disease of native coronary artery with other forms of angina pectoris: Secondary | ICD-10-CM

## 2024-04-04 DIAGNOSIS — I493 Ventricular premature depolarization: Secondary | ICD-10-CM

## 2024-04-04 MED ORDER — NITROGLYCERIN 0.4 MG SL SUBL: SUBLINGUAL_TABLET | SUBLINGUAL | 1 refills | Status: DC

## 2024-04-04 NOTE — Patient Instructions (Addendum)
 Medication Instructions:   No changes  If you need a refill on your cardiac medications before your next appointment, please call your pharmacy.   Lab work: No new labs needed  Testing/Procedures:  Your physician has requested that you have an echocardiogram. Echocardiography is a painless test that uses sound waves to create images of your heart. It provides your doctor with information about the size and shape of your heart and how well your heart's chambers and valves are working.   You may receive an ultrasound enhancing agent through an IV if needed to better visualize your heart during the echo. This procedure takes approximately one hour.  There are no restrictions for this procedure.  This will take place at [address] [facility] [address], [place] [address]  Please note: We ask at that you not bring children with you during ultrasound (echo/ vascular) testing. Due to room size and safety concerns, children are not allowed in the ultrasound rooms during exams. Our front office staff cannot provide observation of children in our lobby area while testing is being conducted. An adult accompanying a patient to their appointment will only be allowed in the ultrasound room at the discretion of the ultrasound technician under special circumstances. We apologize for any inconvenience.   Your physician has recommended that you wear a Zio monitor.   This monitor is a medical device that records the heart's electrical activity. Doctors most often use these monitors to diagnose arrhythmias. Arrhythmias are problems with the speed or rhythm of the heartbeat. The monitor is a small device applied to your chest. You can wear one while you do your normal daily activities. While wearing this monitor if you have any symptoms to push the button and record what you felt. Once you have worn this monitor for the period of time provider prescribed (Usually 14 days), you will return the  monitor device in the postage paid box. Once it is returned they will download the data collected and provide us  with a report which the provider will then review and we will call you with those results. Important tips:  Avoid showering during the first 24 hours of wearing the monitor. Avoid excessive sweating to help maximize wear time. Do not submerge the device, no hot tubs, and no swimming pools. Keep any lotions or oils away from the patch. After 24 hours you may shower with the patch on. Take brief showers with your back facing the shower head.  Do not remove patch once it has been placed because that will interrupt data and decrease adhesive wear time. Push the button when you have any symptoms and write down what you were feeling. Once you have completed wearing your monitor, remove and place into box which has postage paid and place in your outgoing mailbox.  If for some reason you have misplaced your box then call our office and we can provide another box and/or mail it off for you.   Follow-Up: At [facility], you and your health needs are our priority.  As part of our continuing mission to provide you with exceptional heart care, we have created designated Provider Care Teams.  These Care Teams include your primary Cardiologist (physician) and Advanced Practice Providers (APPs -  Physician Assistants and Nurse Practitioners) who all work together to provide you with the care you need, when you need it.  You will need a follow up appointment in 6 months, APP ok  Providers on your designated Care Team:  Lonni Meager, NP Bernardino Bring, PA-C Cadence Franchester, PA-C  COVID-19 Vaccine Information can be found at: [email or web address] For questions related to vaccine distribution or appointments, please email [email or web address] or call [phone].

## 2024-04-05 DIAGNOSIS — L820 Inflamed seborrheic keratosis: Secondary | ICD-10-CM | POA: Diagnosis not present

## 2024-04-05 DIAGNOSIS — D229 Melanocytic nevi, unspecified: Secondary | ICD-10-CM | POA: Diagnosis not present

## 2024-04-05 DIAGNOSIS — L570 Actinic keratosis: Secondary | ICD-10-CM | POA: Diagnosis not present

## 2024-04-05 DIAGNOSIS — L738 Other specified follicular disorders: Secondary | ICD-10-CM | POA: Diagnosis not present

## 2024-04-05 DIAGNOSIS — L821 Other seborrheic keratosis: Secondary | ICD-10-CM | POA: Diagnosis not present

## 2024-04-05 DIAGNOSIS — L814 Other melanin hyperpigmentation: Secondary | ICD-10-CM | POA: Diagnosis not present

## 2024-04-07 ENCOUNTER — Ambulatory Visit (INDEPENDENT_AMBULATORY_CARE_PROVIDER_SITE_OTHER): Admitting: Family Medicine

## 2024-04-07 ENCOUNTER — Encounter: Payer: Self-pay | Admitting: Family Medicine

## 2024-04-07 VITALS — BP 136/60 | HR 58 | Temp 98.5°F | Ht 65.24 in | Wt 163.4 lb

## 2024-04-07 DIAGNOSIS — E871 Hypo-osmolality and hyponatremia: Secondary | ICD-10-CM

## 2024-04-07 DIAGNOSIS — Z0000 Encounter for general adult medical examination without abnormal findings: Secondary | ICD-10-CM

## 2024-04-07 DIAGNOSIS — I498 Other specified cardiac arrhythmias: Secondary | ICD-10-CM

## 2024-04-07 DIAGNOSIS — Z7189 Other specified counseling: Secondary | ICD-10-CM

## 2024-04-07 DIAGNOSIS — K2270 Barrett's esophagus without dysplasia: Secondary | ICD-10-CM | POA: Diagnosis not present

## 2024-04-07 DIAGNOSIS — E7800 Pure hypercholesterolemia, unspecified: Secondary | ICD-10-CM | POA: Diagnosis not present

## 2024-04-07 DIAGNOSIS — I10 Essential (primary) hypertension: Secondary | ICD-10-CM | POA: Diagnosis not present

## 2024-04-07 MED ORDER — OMEPRAZOLE 20 MG PO CPDR: 20.0000 mg | DELAYED_RELEASE_CAPSULE | ORAL | 3 refills | Status: DC

## 2024-04-07 MED ORDER — LISINOPRIL 20 MG PO TABS: 20.0000 mg | ORAL_TABLET | Freq: Every day | ORAL | 3 refills | Status: DC

## 2024-04-07 MED ORDER — ROSUVASTATIN CALCIUM 20 MG PO TABS: 20.0000 mg | ORAL_TABLET | Freq: Every day | ORAL | 3 refills | Status: DC

## 2024-04-07 MED ORDER — METOPROLOL TARTRATE 25 MG PO TABS: ORAL_TABLET | ORAL | 3 refills | Status: DC

## 2024-04-07 MED ORDER — THERA VITAL M PO TABS: 1.0000 | ORAL_TABLET | Freq: Every day | ORAL | Status: DC

## 2024-04-07 NOTE — Patient Instructions (Addendum)
 Don't change your meds for now.  I'll await the update from cardiology.  I would get a flu shot each fall.   Take care.  Glad to see you. See if the gym has adult strengthening classes.

## 2024-04-07 NOTE — Progress Notes (Signed)
 Advance directive- wife designated if patient were incapacitated.   Defer colonoscopy and PSA given his age.  D/w pt.  He agrees. shingrix prev done. tdap 2018 Flu d/w pt PNA up to date.  covid prev RSV prev done.   Elevated Cholesterol: Using medications without problems: yes Muscle aches: prev with some thigh pain that improved with extra exercise.   Diet compliance: d/w pt.  Exercise: d/w pt.  Labs d/w pt.    Hypertension:    Using medication without problems or lightheadedness: yes Chest pain with exertion:no Edema:rare LLE edema, longstanding.  Short of breath:no  CAD.  Had cards eval for bigeminy.  Zio and echo pending.   H/o Barrett's, on PPI at baseline.  Compliant.  No ADE on med.  Has hearing aids.  Mood d/w pt. mood is good per patient report.  Mildly low Na, d/w pt about not changing his meds at least until his echo is resulted.    Meds, vitals, and allergies reviewed.   PMH and SH reviewed  ROS: Per HPI unless specifically indicated in ROS section   GEN: nad, alert and oriented HEENT: ncat NECK: supple w/o LA CV: rrr. PULM: ctab, no inc wob ABD: soft, +bs EXT: no edema SKIN: well perfused.

## 2024-04-10 DIAGNOSIS — I498 Other specified cardiac arrhythmias: Secondary | ICD-10-CM | POA: Insufficient documentation

## 2024-04-10 DIAGNOSIS — E871 Hypo-osmolality and hyponatremia: Secondary | ICD-10-CM | POA: Insufficient documentation

## 2024-04-10 NOTE — Assessment & Plan Note (Signed)
 Continue metoprolol  and lisinopril 

## 2024-04-10 NOTE — Assessment & Plan Note (Signed)
  Mildly low Na, d/w pt about not changing his meds at least until his echo is resulted.

## 2024-04-10 NOTE — Assessment & Plan Note (Signed)
 Advance directive- wife designated if patient were incapacitated.

## 2024-04-10 NOTE — Assessment & Plan Note (Signed)
Continue Crestor.  Continue work on diet and exercise. ?

## 2024-04-10 NOTE — Assessment & Plan Note (Signed)
Continue PPI at baseline.

## 2024-04-10 NOTE — Assessment & Plan Note (Signed)
 Advance directive- wife designated if patient were incapacitated.   Defer colonoscopy and PSA given his age.  D/w pt.  He agrees. shingrix prev done. tdap 2018 Flu d/w pt PNA up to date.  covid prev RSV prev done.

## 2024-04-10 NOTE — Assessment & Plan Note (Signed)
 Had cards eval for bigeminy.  Zio and echo pending.  Will await cardiology reports.

## 2024-04-19 DIAGNOSIS — M9902 Segmental and somatic dysfunction of thoracic region: Secondary | ICD-10-CM | POA: Diagnosis not present

## 2024-04-19 DIAGNOSIS — M5414 Radiculopathy, thoracic region: Secondary | ICD-10-CM | POA: Diagnosis not present

## 2024-04-19 DIAGNOSIS — M546 Pain in thoracic spine: Secondary | ICD-10-CM | POA: Diagnosis not present

## 2024-04-21 ENCOUNTER — Encounter: Payer: Self-pay | Admitting: Cardiovascular Disease

## 2024-04-21 DIAGNOSIS — M5414 Radiculopathy, thoracic region: Secondary | ICD-10-CM | POA: Diagnosis not present

## 2024-04-21 DIAGNOSIS — I493 Ventricular premature depolarization: Secondary | ICD-10-CM | POA: Diagnosis not present

## 2024-04-21 DIAGNOSIS — M9902 Segmental and somatic dysfunction of thoracic region: Secondary | ICD-10-CM | POA: Diagnosis not present

## 2024-04-21 DIAGNOSIS — M546 Pain in thoracic spine: Secondary | ICD-10-CM | POA: Diagnosis not present

## 2024-04-25 DIAGNOSIS — M546 Pain in thoracic spine: Secondary | ICD-10-CM | POA: Diagnosis not present

## 2024-04-25 DIAGNOSIS — M5414 Radiculopathy, thoracic region: Secondary | ICD-10-CM | POA: Diagnosis not present

## 2024-04-25 DIAGNOSIS — M9902 Segmental and somatic dysfunction of thoracic region: Secondary | ICD-10-CM | POA: Diagnosis not present

## 2024-04-28 DIAGNOSIS — M5414 Radiculopathy, thoracic region: Secondary | ICD-10-CM | POA: Diagnosis not present

## 2024-04-28 DIAGNOSIS — M9902 Segmental and somatic dysfunction of thoracic region: Secondary | ICD-10-CM | POA: Diagnosis not present

## 2024-04-28 DIAGNOSIS — M546 Pain in thoracic spine: Secondary | ICD-10-CM | POA: Diagnosis not present

## 2024-05-03 DIAGNOSIS — M5414 Radiculopathy, thoracic region: Secondary | ICD-10-CM | POA: Diagnosis not present

## 2024-05-03 DIAGNOSIS — M546 Pain in thoracic spine: Secondary | ICD-10-CM | POA: Diagnosis not present

## 2024-05-03 DIAGNOSIS — M9902 Segmental and somatic dysfunction of thoracic region: Secondary | ICD-10-CM | POA: Diagnosis not present

## 2024-05-10 DIAGNOSIS — I493 Ventricular premature depolarization: Secondary | ICD-10-CM | POA: Diagnosis not present

## 2024-05-10 DIAGNOSIS — M9902 Segmental and somatic dysfunction of thoracic region: Secondary | ICD-10-CM | POA: Diagnosis not present

## 2024-05-10 DIAGNOSIS — M5414 Radiculopathy, thoracic region: Secondary | ICD-10-CM | POA: Diagnosis not present

## 2024-05-10 DIAGNOSIS — M546 Pain in thoracic spine: Secondary | ICD-10-CM | POA: Diagnosis not present

## 2024-05-14 ENCOUNTER — Ambulatory Visit: Payer: Self-pay | Admitting: Cardiovascular Disease

## 2024-05-16 ENCOUNTER — Telehealth: Payer: Self-pay | Admitting: Cardiovascular Disease

## 2024-05-16 ENCOUNTER — Other Ambulatory Visit: Payer: Self-pay | Admitting: Emergency Medicine

## 2024-05-16 DIAGNOSIS — I493 Ventricular premature depolarization: Secondary | ICD-10-CM

## 2024-05-16 DIAGNOSIS — M9902 Segmental and somatic dysfunction of thoracic region: Secondary | ICD-10-CM | POA: Diagnosis not present

## 2024-05-16 DIAGNOSIS — M5414 Radiculopathy, thoracic region: Secondary | ICD-10-CM | POA: Diagnosis not present

## 2024-05-16 DIAGNOSIS — M546 Pain in thoracic spine: Secondary | ICD-10-CM | POA: Diagnosis not present

## 2024-05-16 NOTE — Telephone Encounter (Signed)
 Patient made aware of results and verbalized understanding.  Holter monitor Normal sinus rhythm Having episodes of very short VT Frequent PVCs 20%   He has echocardiogram pending September 11 Would recommend consultation with electrophysiology given high PVC burden   Wife verbalizes understanding. Order for EP referral placed.

## 2024-05-16 NOTE — Progress Notes (Unsigned)
 Electrophysiology Office Note:   Date:  05/17/2024  ID:  Barry Small, DOB 07-13-1934, MRN [id number]  Primary Cardiologist: None Electrophysiologist: Fonda Kitty, MD      History of Present Illness:   Barry Small is a 89 y.o. male with h/o CAD s/p CABG in 1989, PCI in 2007, HLD, HTN who is being seen today for PVCs at the request of Dr. Gollan.  Discussed the use of AI scribe software for clinical note transcription with the patient, who gave verbal consent to proceed.  History of Present Illness Barry Small is an 89 year old male who presents with extra heartbeats detected on a cardiac monitor. He is accompanied by his wife. He was referred by Dr. Gollan for evaluation of extra heartbeats detected on a cardiac monitor.  No chest pain, frequent palpitations, dizziness, or presyncope. He experiences rare shortness of breath and chills, but these are infrequent and mild. His pulse typically ranges from 68 to 76 beats per minute, except during physical activity, and has been consistent for months.  He has a history of a tick bite approximately five to six weeks ago, initially thought to be a bump. He has not been tested for Lyme disease since the tick bite.  He is currently taking metoprolol , which he has been on for many years to help manage his heart rate. He takes half a tablet daily. He uses a pulse oximeter and a watch to monitor his heart rate at home, noting discrepancies between the two devices, with the pulse oximeter often showing lower readings than his watch. His oxygen saturation typically ranges from 95% to 97%.  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   Review of systems complete and found to be negative unless listed in HPI.   EP Information / Studies Reviewed:    EKG is ordered today. Personal review as below.  EKG Interpretation Date/Time:  Tuesday May 17 2024  10:47:54 EDT Ventricular Rate:  68 PR Interval:  224 QRS Duration:  88 QT Interval:  446 QTC Calculation: 474 R Axis:   -33  Text Interpretation: Sinus rhythm with 1st degree A-V block with occasional Premature ventricular complexes Left axis deviation Inferior infarct , age undetermined When compared with ECG of 04-Apr-2024 09:30,PVCs less frequent today. Confirmed by Kitty Fonda [phone]) on 05/17/2024 10:57:25 AM   Zio 04/09/24:   Nuclear Stress 10/12/19:  There is a medium defect of severe severity present in the basal inferolateral and mid inferolateral location. This is an intermediate risk study. The left ventricular ejection fraction is mildly decreased (47%). Findings consistent with prior myocardial infarction. No evidence for ischemia Diaphragmatic and subdiaphragmatic attenuations noted  Echo 04/05/15:  - Left ventricle: The cavity size was normal. There was mild focal    basal hypertrophy of the septum. Systolic function was normal.    The estimated ejection fraction was in the range of 55% to 60%.    Wall motion was normal; there were no regional wall motion    abnormalities. Doppler parameters are consistent with abnormal    left ventricular relaxation (grade 1 diastolic dysfunction).  - Aortic valve: There was mild to moderate regurgitation.  - Mitral valve: Systolic bowing without prolapse. There was mild to    moderate regurgitation.  - Left atrium: The atrium was mildly dilated       Physical Exam:   VS:  BP ROLLEN)  158/60 (BP Location: Left Arm, Patient Position: Sitting, Cuff Size: Normal)   Pulse 68   Ht 5' 5 (1.651 m)   Wt 162 lb 4 oz (73.6 kg)   SpO2 96%   BMI 27.00 kg/m    Wt Readings from Last 3 Encounters:  05/17/24 162 lb 4 oz (73.6 kg)  04/07/24 163 lb 6.4 oz (74.1 kg)  04/04/24 165 lb (74.8 kg)     GEN: Well nourished, well developed in no acute distress NECK: No JVD CARDIAC: Normal rate, regular rhythm. RESPIRATORY:  Clear to auscultation  without rales, wheezing or rhonchi  ABDOMEN: Soft, non-distended EXTREMITIES:  No edema; No deformity   ASSESSMENT AND PLAN:    #Frequent PVCs: Aysmptomatic. No clinical signs or symptoms of heart failure to suggest a PVC induced cardiomyopathy. -Echo scheduled 05/19/24. If EF preserved/unchanged then no PVC suppression needed. If EF reduced then we will start anti-arrhythmic therapy for suppression. -Continue metoprolol  12.5mg  BID.  #CAD s/p CABG and PCI:  -Continue aspirin 81mg  daily and rosuvastatin  20mg  daily.  #Hypertension -Above goal today.  Recommend checking blood pressures 1-2 times per week at home and recording the values.  Recommend bringing these recordings to the primary care physician.  #Tick bite weeks ago with rash:  -Lyme serologies.  Follow up with EP Team as needed based on results of echocardiogram.   Signed, Fonda Kitty, MD

## 2024-05-16 NOTE — Telephone Encounter (Signed)
 Patient is returning call to [place].

## 2024-05-17 ENCOUNTER — Other Ambulatory Visit: Payer: Self-pay

## 2024-05-17 ENCOUNTER — Ambulatory Visit: Attending: Cardiology | Admitting: Cardiology

## 2024-05-17 ENCOUNTER — Encounter: Payer: Self-pay | Admitting: Cardiology

## 2024-05-17 ENCOUNTER — Institutional Professional Consult (permissible substitution): Admitting: Cardiology

## 2024-05-17 VITALS — BP 158/60 | HR 68 | Ht 65.0 in | Wt 162.2 lb

## 2024-05-17 DIAGNOSIS — W57XXXA Bitten or stung by nonvenomous insect and other nonvenomous arthropods, initial encounter: Secondary | ICD-10-CM | POA: Diagnosis not present

## 2024-05-17 DIAGNOSIS — I493 Ventricular premature depolarization: Secondary | ICD-10-CM

## 2024-05-17 DIAGNOSIS — S0006XD Insect bite (nonvenomous) of scalp, subsequent encounter: Secondary | ICD-10-CM

## 2024-05-17 DIAGNOSIS — I10 Essential (primary) hypertension: Secondary | ICD-10-CM

## 2024-05-17 DIAGNOSIS — Z951 Presence of aortocoronary bypass graft: Secondary | ICD-10-CM | POA: Diagnosis not present

## 2024-05-17 NOTE — Patient Instructions (Addendum)
 Medication Instructions:  Your physician recommends that you continue on your current medications as directed. Please refer to the Current Medication list given to you today.  *If you need a refill on your cardiac medications before your next appointment, please call your pharmacy*  Labs:  TODAY: Lyme disease test  Follow-Up: At [facility], you and your health needs are our priority.  As part of our continuing mission to provide you with exceptional heart care, our providers are all part of one team.  This team includes your primary Cardiologist (physician) and Advanced Practice Providers or APPs (Physician Assistants and Nurse Practitioners) who all work together to provide you with the care you need, when you need it.  Your next appointment:   As needed with Dr. Kennyth based on results of your echocardiogram

## 2024-05-18 LAB — LYME DISEASE SEROLOGY W/REFLEX: Lyme Total Antibody EIA: NEGATIVE

## 2024-05-19 ENCOUNTER — Ambulatory Visit: Attending: Cardiovascular Disease

## 2024-05-19 ENCOUNTER — Ambulatory Visit: Payer: Self-pay | Admitting: Cardiology

## 2024-05-19 DIAGNOSIS — I493 Ventricular premature depolarization: Secondary | ICD-10-CM

## 2024-05-19 LAB — ECHOCARDIOGRAM COMPLETE
AR max vel: 2.32 cm2
AV Area VTI: 2.3 cm2
AV Area mean vel: 2.24 cm2
AV Mean grad: 6 mmHg
AV Peak grad: 12.7 mmHg
Ao pk vel: 1.78 m/s
Area-P 1/2: 4.04 cm2
Calc EF: 46.8 %
MV M vel: 5.75 m/s
MV Peak grad: 132.3 mmHg
MV VTI: 2.27 cm2
P 1/2 time: 434 ms
Radius: 0.6 cm
S' Lateral: 3.3 cm
Single Plane A2C EF: 45.9 %
Single Plane A4C EF: 46.7 %

## 2024-05-30 DIAGNOSIS — M9903 Segmental and somatic dysfunction of lumbar region: Secondary | ICD-10-CM | POA: Diagnosis not present

## 2024-05-30 DIAGNOSIS — M5414 Radiculopathy, thoracic region: Secondary | ICD-10-CM | POA: Diagnosis not present

## 2024-05-30 DIAGNOSIS — M546 Pain in thoracic spine: Secondary | ICD-10-CM | POA: Diagnosis not present

## 2024-05-30 DIAGNOSIS — M5451 Vertebrogenic low back pain: Secondary | ICD-10-CM | POA: Diagnosis not present

## 2024-05-30 DIAGNOSIS — M9902 Segmental and somatic dysfunction of thoracic region: Secondary | ICD-10-CM | POA: Diagnosis not present

## 2024-06-01 DIAGNOSIS — M47896 Other spondylosis, lumbar region: Secondary | ICD-10-CM | POA: Diagnosis not present

## 2024-06-16 DIAGNOSIS — M9902 Segmental and somatic dysfunction of thoracic region: Secondary | ICD-10-CM | POA: Diagnosis not present

## 2024-06-16 DIAGNOSIS — M5414 Radiculopathy, thoracic region: Secondary | ICD-10-CM | POA: Diagnosis not present

## 2024-06-16 DIAGNOSIS — M546 Pain in thoracic spine: Secondary | ICD-10-CM | POA: Diagnosis not present

## 2024-06-16 DIAGNOSIS — M5451 Vertebrogenic low back pain: Secondary | ICD-10-CM | POA: Diagnosis not present

## 2024-06-16 DIAGNOSIS — M9903 Segmental and somatic dysfunction of lumbar region: Secondary | ICD-10-CM | POA: Diagnosis not present

## 2024-06-17 DIAGNOSIS — M9902 Segmental and somatic dysfunction of thoracic region: Secondary | ICD-10-CM | POA: Diagnosis not present

## 2024-06-17 DIAGNOSIS — M546 Pain in thoracic spine: Secondary | ICD-10-CM | POA: Diagnosis not present

## 2024-06-17 DIAGNOSIS — M9903 Segmental and somatic dysfunction of lumbar region: Secondary | ICD-10-CM | POA: Diagnosis not present

## 2024-06-17 DIAGNOSIS — M5451 Vertebrogenic low back pain: Secondary | ICD-10-CM | POA: Diagnosis not present

## 2024-06-17 DIAGNOSIS — M5414 Radiculopathy, thoracic region: Secondary | ICD-10-CM | POA: Diagnosis not present

## 2024-06-27 DIAGNOSIS — M5414 Radiculopathy, thoracic region: Secondary | ICD-10-CM | POA: Diagnosis not present

## 2024-06-27 DIAGNOSIS — M9902 Segmental and somatic dysfunction of thoracic region: Secondary | ICD-10-CM | POA: Diagnosis not present

## 2024-06-27 DIAGNOSIS — M9903 Segmental and somatic dysfunction of lumbar region: Secondary | ICD-10-CM | POA: Diagnosis not present

## 2024-06-27 DIAGNOSIS — M5451 Vertebrogenic low back pain: Secondary | ICD-10-CM | POA: Diagnosis not present

## 2024-06-27 DIAGNOSIS — M546 Pain in thoracic spine: Secondary | ICD-10-CM | POA: Diagnosis not present

## 2024-06-28 DIAGNOSIS — M9902 Segmental and somatic dysfunction of thoracic region: Secondary | ICD-10-CM | POA: Diagnosis not present

## 2024-06-28 DIAGNOSIS — M9903 Segmental and somatic dysfunction of lumbar region: Secondary | ICD-10-CM | POA: Diagnosis not present

## 2024-06-28 DIAGNOSIS — M5414 Radiculopathy, thoracic region: Secondary | ICD-10-CM | POA: Diagnosis not present

## 2024-06-28 DIAGNOSIS — M546 Pain in thoracic spine: Secondary | ICD-10-CM | POA: Diagnosis not present

## 2024-06-28 DIAGNOSIS — M5451 Vertebrogenic low back pain: Secondary | ICD-10-CM | POA: Diagnosis not present

## 2024-06-30 ENCOUNTER — Ambulatory Visit: Admitting: Family Medicine

## 2024-06-30 ENCOUNTER — Encounter: Payer: Self-pay | Admitting: Family Medicine

## 2024-06-30 VITALS — BP 138/80 | HR 61 | Temp 97.8°F | Wt 163.2 lb

## 2024-06-30 DIAGNOSIS — M546 Pain in thoracic spine: Secondary | ICD-10-CM | POA: Diagnosis not present

## 2024-06-30 DIAGNOSIS — M549 Dorsalgia, unspecified: Secondary | ICD-10-CM

## 2024-06-30 DIAGNOSIS — M9902 Segmental and somatic dysfunction of thoracic region: Secondary | ICD-10-CM | POA: Diagnosis not present

## 2024-06-30 DIAGNOSIS — M9903 Segmental and somatic dysfunction of lumbar region: Secondary | ICD-10-CM | POA: Diagnosis not present

## 2024-06-30 DIAGNOSIS — M5414 Radiculopathy, thoracic region: Secondary | ICD-10-CM | POA: Diagnosis not present

## 2024-06-30 DIAGNOSIS — K5900 Constipation, unspecified: Secondary | ICD-10-CM

## 2024-06-30 DIAGNOSIS — M5451 Vertebrogenic low back pain: Secondary | ICD-10-CM | POA: Diagnosis not present

## 2024-06-30 MED ORDER — POLYETHYLENE GLYCOL 3350 17 GM/SCOOP PO POWD: 17.0000 g | Freq: Every day | ORAL | Status: DC

## 2024-06-30 MED ORDER — MELOXICAM 7.5 MG PO TABS: 7.5000 mg | ORAL_TABLET | Freq: Every day | ORAL | 0 refills | Status: DC

## 2024-06-30 NOTE — Progress Notes (Signed)
 Constipation d/w pt.  No abd pain.  No masses noted by patient.  He has trouble with passing BM.  Sig gas production.  Tried dulcolax recently, then had BM.  Last BM was 2 days ago.  He had several days prior to that w/o BM.  No diet changes recently.  No med changes.  No bloody or black stools.    He had prev chest wall pain after his weed eater kicked back.  His appetite was lower when he had more lower back pain but that improved.  He changed mattresses in the meantime.  He has been dealing with L lower back pain, improved after seeing chiropractor.  Lidocaine patches didn't help much.  Pain can occ radiate down the L leg.   Meds, vitals, and allergies reviewed.   ROS: Per HPI unless specifically indicated in ROS section   Nad Ncat Neck supple, no LA Rrr Ctab Abd soft, not ttp, normal BS Back not ttp now. SLR neg B S/S grossly wnl B.

## 2024-06-30 NOTE — Patient Instructions (Addendum)
 Try using miralax  daily in the meantime.  Then use dulcolax if needed.    Update me as needed.   Try taking meloxicam with food at night. Don't take with aleve or ibuprofen.  Try alternating heat and ice for 5 minutes at a time.   Take care.  Glad to see you.

## 2024-07-01 ENCOUNTER — Telehealth: Payer: Self-pay

## 2024-07-01 MED ORDER — PREDNISONE 20 MG PO TABS: ORAL_TABLET | ORAL | 0 refills | Status: DC

## 2024-07-01 NOTE — Addendum Note (Signed)
 Addended by: [name] on: 07/01/2024 05:35 PM   Modules accepted: Orders

## 2024-07-01 NOTE — Telephone Encounter (Signed)
 Called patient spoke to his wife ok dpr. They did not know for sure if he had been on prednisone in the past. It does look like he had in 2009. They didn't remember him having any issues with it at that time. They would like try try prednisone. They would like to [facility] to [facility] [address] not the one in [employer].

## 2024-07-01 NOTE — Telephone Encounter (Signed)
 I spoke with pts daughter (DPR signed) and notified as instructed per Dr Cleatus and she asked her father if he could tolerate prednisone by mouth. Pt told daughter that this had been going on for so long and pt wants a shot of cortisone. I repeated that per Dr Elfredia note pt could not get injection here and she said pt still wants cortisone shot and to send note back to Dr Cleatus.

## 2024-07-01 NOTE — Telephone Encounter (Signed)
 Copied from CRM [id number]. Topic: Clinical - Prescription Issue >> Jul 01, 2024 12:36 PM Pinkey ORN wrote: Reason for CRM: meloxicam [facility]) 7.5 MG tablet >> Jul 01, 2024 12:39 PM Pinkey ORN wrote: Patient's wife states patient isn't able to take the meloxicam (MOBIC) 7.5 MG tablet as it makes his heart rate extremely high.SABRA Gowda is wanting to know if patient could have a cortisone shot instead. Please follow up.

## 2024-07-01 NOTE — Telephone Encounter (Signed)
 Sent. Thanks.

## 2024-07-01 NOTE — Telephone Encounter (Signed)
 We couldn't inject him here. Can he tolerate prednisone?  Please let me know.  Thanks.

## 2024-07-01 NOTE — Telephone Encounter (Signed)
 If he can tolerate prednisone, then I can send that.  I wouldn't try injection.

## 2024-07-03 DIAGNOSIS — M549 Dorsalgia, unspecified: Secondary | ICD-10-CM | POA: Insufficient documentation

## 2024-07-03 NOTE — Assessment & Plan Note (Signed)
 Discussed options. He can try taking meloxicam with food at night. Don't take with aleve or ibuprofen.  Try alternating heat and ice for 5 minutes at a time.  See following phone note.

## 2024-07-03 NOTE — Assessment & Plan Note (Signed)
 Discussed options. He can try using miralax  daily in the meantime.  Then use dulcolax if needed.  Update me as needed.  Benign abdominal exam.  Okay for outpatient follow-up.

## 2024-07-04 DIAGNOSIS — M9902 Segmental and somatic dysfunction of thoracic region: Secondary | ICD-10-CM | POA: Diagnosis not present

## 2024-07-04 DIAGNOSIS — M5451 Vertebrogenic low back pain: Secondary | ICD-10-CM | POA: Diagnosis not present

## 2024-07-04 DIAGNOSIS — M5414 Radiculopathy, thoracic region: Secondary | ICD-10-CM | POA: Diagnosis not present

## 2024-07-04 DIAGNOSIS — M546 Pain in thoracic spine: Secondary | ICD-10-CM | POA: Diagnosis not present

## 2024-07-04 DIAGNOSIS — M9903 Segmental and somatic dysfunction of lumbar region: Secondary | ICD-10-CM | POA: Diagnosis not present

## 2024-07-05 ENCOUNTER — Ambulatory Visit: Admitting: Family Medicine

## 2024-07-06 ENCOUNTER — Ambulatory Visit: Payer: Self-pay

## 2024-07-06 DIAGNOSIS — M549 Dorsalgia, unspecified: Secondary | ICD-10-CM

## 2024-07-06 NOTE — Telephone Encounter (Addendum)
 Pt's wife reports was in office last week, 06/30/24, symptoms are the same as last week, not worse.  Pt's wife reports prednisone is not helping and cannot take Meloxicam due to heart racing, MD aware.  Pt's wife requesting alternative medication and call back from office.  Advised UC/ED if symptoms worsen.

## 2024-07-06 NOTE — Telephone Encounter (Signed)
 Routed to the wrong location.

## 2024-07-06 NOTE — Telephone Encounter (Signed)
 FYI Only or Action Required?: Action required by provider: referral request.  Patient was last seen in primary care on 06/30/2024 by [name], MD.  Called Nurse Triage reporting Back Pain and Shoulder Pain.  Symptoms began several months ago.  Interventions attempted: Prescription medications: Prednisone, Meloxicam and Rest, hydration, or home remedies.  Symptoms are: gradually worsening.  Triage Disposition: Information or Advice Only Call  Patient/caregiver understands and will follow disposition?: Yes Reason for Disposition  Health information question, no triage required and triager able to answer question  Answer Assessment - Initial Assessment Questions Patient's wife calling in because patient is in so much, states he could not take Meloxicam and is unable to take it due to it increasing his heart rate. Currently taking the prescribed prednisone. Patient's wife would like a referral somewhere or to know what next steps could be as patient is in so much pain.Patients wife would like a call back   1. REASON FOR CALL: What is the main reason for your call? or How can I best help you?     Patient's wife calling in because patient is still in severe pain and wants to know what to do .  2. SYMPTOMS : Do you have any symptoms?      Back and shoulder  Protocols used: Information Only Call - No Triage-A-AH  Copied from CRM [id number]. Topic: Clinical - Red Word Triage >> Jul 06, 2024 12:20 PM Frederich PARAS wrote: [facility] that prompted transfer to Nurse Triage: severe pain He is still in severe pain in his shoulder and lower back. Nothing working for th pain.

## 2024-07-07 ENCOUNTER — Other Ambulatory Visit: Payer: Self-pay | Admitting: Family Medicine

## 2024-07-07 MED ORDER — TRAMADOL HCL 50 MG PO TABS: 50.0000 mg | ORAL_TABLET | Freq: Three times a day (TID) | ORAL | 0 refills | Status: AC | PRN

## 2024-07-07 NOTE — Telephone Encounter (Addendum)
 Please triage patient about his various pain.   We talked about back pain at the OV, not shoulder pain.   See if he has any new symptoms .  It not better with meloxicam/prednisone, then reasonable to see ortho.   I put in the referral.  I put in for plain films in the meantime.  He could try taking tramadol in the meantime, sedation caution.  Rx sent.  Thanks.

## 2024-07-07 NOTE — Addendum Note (Signed)
 Addended by: [name] on: 07/07/2024 02:21 PM   Modules accepted: Orders

## 2024-07-11 DIAGNOSIS — H524 Presbyopia: Secondary | ICD-10-CM | POA: Diagnosis not present

## 2024-07-12 DIAGNOSIS — M5451 Vertebrogenic low back pain: Secondary | ICD-10-CM | POA: Diagnosis not present

## 2024-07-12 NOTE — Telephone Encounter (Signed)
 Noted. Thanks.  If better with PT, then I would continue as is.

## 2024-07-13 NOTE — Telephone Encounter (Signed)
 Wife returned call to office. On DPR. Information given will reach out does not continue to help with pain. He is a little sore today but otc meds are helping.

## 2024-07-13 NOTE — Telephone Encounter (Signed)
 Left message to return call to our office.

## 2024-07-13 NOTE — Telephone Encounter (Signed)
 Noted. Thanks.

## 2024-07-14 ENCOUNTER — Ambulatory Visit: Payer: Self-pay

## 2024-07-14 NOTE — Telephone Encounter (Signed)
 Called wife and advised her of Dr. Elfredia comments and recommendations. Pt's wife said they saw [facility] once and then they did a referral for PT so pt is doing PT with [facility] but doesn't have a f/u with an Ortho provider after the PT. Wife scheduled f/u with Dr. Cleatus on Monday 07/18/24 but did want PCP to know pt gets upset when wife mentions confusion so she doesn't want pt to know she is the one who told PCP about his confusion. Wife also asked with pt can take for pain until appt with PCP on Monday since he advised him to d/c tramadol.   ER/UC precautions given since appt isn't until Monday

## 2024-07-14 NOTE — Telephone Encounter (Signed)
 If having confusion would stop using tramadol and he needs to get rechecked re: confusion.   When can he see ortho about his pain?

## 2024-07-14 NOTE — Telephone Encounter (Signed)
 Would try an OTC lidocaine patch in the meantime, with tylenol if  needed.  Thanks.

## 2024-07-14 NOTE — Telephone Encounter (Signed)
 Called and spoke with patient and wife. They are aware of Dr. Elfredia recommendation to continue lidocaine patches and tylenol.  Will have appointment Monday at 3pm.

## 2024-07-14 NOTE — Telephone Encounter (Signed)
 FYI Only or Action Required?: Action required by provider: clinical question for provider and update on patient condition.  Patient was last seen in primary care on 06/30/2024 by [name], MD.  Called Nurse Triage reporting Shoulder Pain.  Symptoms began 3 months ago.  Interventions attempted: Prescription medications: See Mar and Rest, hydration, or home remedies.  Symptoms are: unchanged.  Triage Disposition: See PCP Within 2 Weeks  Patient/caregiver understands and will follow disposition?: Yes  Copied from CRM [id number]. Topic: Clinical - Red Word Triage >> Jul 14, 2024  9:07 AM Harlene ORN wrote: Red Word that prompted transfer to Nurse Triage: still in a lot of pain in right shoulder loss of appetite none of the pain pills that were prescribed by his PCP are not effective. Wife is noticing showing signs of confusion Reason for Disposition  Shoulder pain is a chronic symptom (recurrent or ongoing AND present > 4 weeks)  Answer Assessment - Initial Assessment Questions Patient's wife called with concerns of continued right shoulder pain. Wife states pain is dependent on his movement. Endorses patient isn't sleeping well and not eating a lot. Requesting a call back from provider with any recommendations.   1. ONSET: When did the pain start?     Pain has been going on for three days 2. LOCATION: Where is the pain located?     Right shoulder  3. PAIN: How bad is the pain? (Scale 1-10; or mild, moderate, severe)     Wife states pain is dependent on his movement 4. WORK OR EXERCISE: Has there been any recent work or exercise that involved this part of the body?     no 5. CAUSE: What do you think is causing the shoulder pain?     unsure 6. OTHER SYMPTOMS: Do you have any other symptoms? (e.g., neck pain, swelling, rash, fever, numbness, weakness)     Little appetite, not sleeping well.  Protocols used: Shoulder Pain-A-AH

## 2024-07-15 ENCOUNTER — Emergency Department

## 2024-07-15 ENCOUNTER — Emergency Department
Admission: EM | Admit: 2024-07-15 | Discharge: 2024-07-15 | Disposition: A | Discharge status: Home / Self Care | Source: Home / Self Care | Attending: Emergency Medicine | Admitting: Emergency Medicine

## 2024-07-15 DIAGNOSIS — R918 Other nonspecific abnormal finding of lung field: Secondary | ICD-10-CM | POA: Insufficient documentation

## 2024-07-15 DIAGNOSIS — I10 Essential (primary) hypertension: Secondary | ICD-10-CM | POA: Insufficient documentation

## 2024-07-15 DIAGNOSIS — D72829 Elevated white blood cell count, unspecified: Secondary | ICD-10-CM | POA: Insufficient documentation

## 2024-07-15 DIAGNOSIS — M545 Low back pain, unspecified: Secondary | ICD-10-CM | POA: Insufficient documentation

## 2024-07-15 DIAGNOSIS — I2510 Atherosclerotic heart disease of native coronary artery without angina pectoris: Secondary | ICD-10-CM | POA: Insufficient documentation

## 2024-07-15 DIAGNOSIS — Z955 Presence of coronary angioplasty implant and graft: Secondary | ICD-10-CM | POA: Insufficient documentation

## 2024-07-15 DIAGNOSIS — A419 Sepsis, unspecified organism: Secondary | ICD-10-CM | POA: Diagnosis not present

## 2024-07-15 DIAGNOSIS — R000 Tachycardia, unspecified: Secondary | ICD-10-CM | POA: Diagnosis not present

## 2024-07-15 DIAGNOSIS — R0902 Hypoxemia: Secondary | ICD-10-CM | POA: Diagnosis not present

## 2024-07-15 LAB — TROPONIN I (HIGH SENSITIVITY)
Troponin I (High Sensitivity): 15 ng/L (ref <18)
Troponin I (High Sensitivity): 15 ng/L (ref <18)

## 2024-07-15 LAB — CBC
HCT: 35.6 % — ABNORMAL LOW (ref 39.0–52.0)
Hemoglobin: 12.2 g/dL — ABNORMAL LOW (ref 13.0–17.0)
MCH: 29.1 pg (ref 26.0–34.0)
MCHC: 34.3 g/dL (ref 30.0–36.0)
MCV: 85 fL (ref 80.0–100.0)
Platelets: 202 K/uL (ref 150–400)
RBC: 4.19 MIL/uL — ABNORMAL LOW (ref 4.22–5.81)
RDW: 17.2 % — ABNORMAL HIGH (ref 11.5–15.5)
WBC: 21.2 K/uL — ABNORMAL HIGH (ref 4.0–10.5)
nRBC: 0 % (ref 0.0–0.2)

## 2024-07-15 LAB — COMPREHENSIVE METABOLIC PANEL WITH GFR
ALT: 26 U/L (ref 0–44)
AST: 38 U/L (ref 15–41)
Albumin: 3.1 g/dL — ABNORMAL LOW (ref 3.5–5.0)
Alkaline Phosphatase: 161 U/L — ABNORMAL HIGH (ref 38–126)
Anion gap: 12 (ref 5–15)
BUN: 12 mg/dL (ref 8–23)
CO2: 21 mmol/L — ABNORMAL LOW (ref 22–32)
Calcium: 8.4 mg/dL — ABNORMAL LOW (ref 8.9–10.3)
Chloride: 97 mmol/L — ABNORMAL LOW (ref 98–111)
Creatinine, Ser: 0.52 mg/dL — ABNORMAL LOW (ref 0.61–1.24)
GFR, Estimated: >60 mL/min (ref >60)
Glucose, Bld: 118 mg/dL — ABNORMAL HIGH (ref 70–99)
Potassium: 3.8 mmol/L (ref 3.5–5.1)
Sodium: 130 mmol/L — ABNORMAL LOW (ref 135–145)
Total Bilirubin: 1.2 mg/dL (ref 0.0–1.2)
Total Protein: 6.8 g/dL (ref 6.5–8.1)

## 2024-07-15 LAB — URINALYSIS, ROUTINE W REFLEX MICROSCOPIC
Bilirubin Urine: NEGATIVE
Glucose, UA: NEGATIVE mg/dL
Hgb urine dipstick: NEGATIVE
Ketones, ur: NEGATIVE mg/dL
Leukocytes,Ua: NEGATIVE
Nitrite: NEGATIVE
Protein, ur: NEGATIVE mg/dL
Specific Gravity, Urine: 1.015 (ref 1.005–1.030)
pH: 7 (ref 5.0–8.0)

## 2024-07-15 MED ORDER — DOXYCYCLINE HYCLATE 100 MG PO TABS: 100.0000 mg | ORAL_TABLET | Freq: Two times a day (BID) | ORAL | 0 refills | Status: DC

## 2024-07-15 MED ORDER — IOHEXOL 350 MG/ML SOLN
100.0000 mL | Freq: Once | INTRAVENOUS | Status: AC | PRN
  Administered 2024-07-15: 100 mL via INTRAVENOUS

## 2024-07-15 MED ORDER — KETOROLAC TROMETHAMINE 30 MG/ML IJ SOLN
15.0000 mg | Freq: Once | INTRAMUSCULAR | Status: DC
  Filled 2024-07-15: qty 1

## 2024-07-15 MED ORDER — LACTATED RINGERS IV BOLUS
500.0000 mL | Freq: Once | INTRAVENOUS | Status: AC
  Administered 2024-07-15: 500 mL via INTRAVENOUS

## 2024-07-15 MED ORDER — LIDOCAINE 5 % EX PTCH
2.0000 | MEDICATED_PATCH | CUTANEOUS | Status: DC
  Administered 2024-07-15: 2 via TRANSDERMAL
  Filled 2024-07-15: qty 2

## 2024-07-15 MED ORDER — KETOROLAC TROMETHAMINE 30 MG/ML IJ SOLN
15.0000 mg | Freq: Once | INTRAMUSCULAR | Status: AC
  Administered 2024-07-15: 15 mg via INTRAVENOUS

## 2024-07-15 MED ORDER — OXYCODONE HCL 5 MG PO TABS: 5.0000 mg | ORAL_TABLET | Freq: Three times a day (TID) | ORAL | 0 refills | Status: DC | PRN

## 2024-07-15 MED ORDER — METHOCARBAMOL 500 MG PO TABS
500.0000 mg | ORAL_TABLET | Freq: Once | ORAL | Status: AC
  Administered 2024-07-15: 500 mg via ORAL
  Filled 2024-07-15: qty 1

## 2024-07-15 MED ORDER — MORPHINE SULFATE (PF) 2 MG/ML IV SOLN
2.0000 mg | Freq: Once | INTRAVENOUS | Status: AC
  Administered 2024-07-15: 2 mg via INTRAVENOUS
  Filled 2024-07-15: qty 1

## 2024-07-15 MED ORDER — DOXYCYCLINE HYCLATE 100 MG PO TABS
100.0000 mg | ORAL_TABLET | Freq: Once | ORAL | Status: AC
  Administered 2024-07-15: 100 mg via ORAL
  Filled 2024-07-15: qty 1

## 2024-07-15 NOTE — ED Triage Notes (Addendum)
 Pt to ED via POV from home with family. Pt ambulatory to triage. Pt reports lower back pain that has been ongoing. Pt also reports having episodes of low BP and intermittent dizziness. Denies urinary symptoms or N/V/D. Pt is on BP meds and did not take medication this am. No blood thinners

## 2024-07-15 NOTE — ED Notes (Signed)
 Called [facility] to initiate cardiac monitoring.

## 2024-07-15 NOTE — Discharge Instructions (Addendum)
 As we discussed, lung mass to the bottom right of your lungs that looks like it has spread to a rib nearby likely causing your pain.  The mass is 5 x 3 cm  The cancer doctors/oncologists should call you early next week to schedule an appointment to be seen in the clinic and discuss next steps.  Such as a biopsy to get a better idea of exactly what this is  You are being discharged with a prescription for doxycycline antibiotic to treat pneumonia considering your cough and elevated white blood cells on your blood work.  Twice daily for 1 week  Use Tylenol for pain and fevers.  Up to 1000 mg per dose, up to 4 times per day.  Do not take more than 4000 mg of Tylenol/acetaminophen within 24 hours..  Also being prescribed oxycodone pain medication to use in addition to the Tylenol.    If you develop any worsening symptoms or other concerns in the meantime then please return to the ED

## 2024-07-15 NOTE — ED Provider Notes (Signed)
 [facility] Provider Note    Event Date/Time   First MD Initiated Contact with Patient 07/15/24 [id number]     (approximate)   History   Back Pain, Hypotension, and Dizziness   HPI  DAYQUAN BUYS is a 89 y.o. male who presents to the ED for evaluation of Back Pain, Hypotension, and Dizziness   I reviewed PCP visit from 2 weeks ago where patient was evaluated for intermittent low back pain with left-sided sciatica, constipation, chest discomfort after being struck by a weed eater. History of CAD s/p CABG 1989, PCI 2007.  HTN.  Patient presents to the ED with multiple weeks of atraumatic right sided back pain waxing and waning.  Reports poor sleep at night due to the pain.  Minimal improvement with Tylenol at home.  Reports sometimes issues with right sided lumbar, sometimes right sided thoracic.  No falls or injuries, weakness or numbness to the arm or leg on the right side.  No fevers.  No anterior pain to his chest or abdomen.  Notably, triage note indicates patient has had low blood pressure and intermittent dizziness.  When I clarified with this patient he denies any dizziness and low blood pressure he reports concern for elevated blood pressure in the setting of his pain. He frequently seems to confuse pulse and blood pressure during conversation.  Physical Exam   Triage Vital Signs: ED Triage Vitals  Encounter Vitals Group     BP 07/15/24 0752 (!) 160/119     Girls Systolic BP Percentile --      Girls Diastolic BP Percentile --      Boys Systolic BP Percentile --      Boys Diastolic BP Percentile --      Pulse Rate 07/15/24 0752 (!) 55     Resp 07/15/24 0752 18     Temp 07/15/24 0752 98.7 F (37.1 C)     Temp Source 07/15/24 0752 Oral     SpO2 07/15/24 0752 98 %     Weight --      Height --      Head Circumference --      Peak Flow --      Pain Score 07/15/24 0753 5     Pain Loc --      Pain Education --      Exclude from Growth Chart --      Most recent vital signs: Vitals:   07/15/24 0930 07/15/24 1139  BP: (!) 143/88   Pulse: 91   Resp: (!) 23   Temp:  97.9 F (36.6 C)  SpO2: 98%     General: Awake, no distress.  Able to sit upright by himself so I can examine his back.  Mild tenderness to right sided paraspinal lumbar, slightly more intense tenderness to palpation to right sided paraspinal thoracic back, near his scapular spine.  No overlying skin changes, rash or signs of trauma.  No midline spinal tenderness throughout CV:  Good peripheral perfusion.  Resp:  Normal effort.  Abd:  No distention.  Soft and nontender MSK:  No deformity noted.  Moving all 4 without deficits or signs of trauma Neuro:  No focal deficits appreciated. Other:     ED Results / Procedures / Treatments   Labs (all labs ordered are listed, but only abnormal results are displayed) Labs Reviewed  COMPREHENSIVE METABOLIC PANEL WITH GFR - Abnormal; Notable for the following components:      Result Value   Sodium 130 (*)  Chloride 97 (*)    CO2 21 (*)    Glucose, Bld 118 (*)    Creatinine, Ser 0.52 (*)    Calcium  8.4 (*)    Albumin 3.1 (*)    Alkaline Phosphatase 161 (*)    All other components within normal limits  CBC - Abnormal; Notable for the following components:   WBC 21.2 (*)    RBC 4.19 (*)    Hemoglobin 12.2 (*)    HCT 35.6 (*)    RDW 17.2 (*)    All other components within normal limits  URINALYSIS, ROUTINE W REFLEX MICROSCOPIC - Abnormal; Notable for the following components:   Color, Urine YELLOW (*)    APPearance CLOUDY (*)    All other components within normal limits  TROPONIN I (HIGH SENSITIVITY)  TROPONIN I (HIGH SENSITIVITY)    EKG Sinus rhythm with a rate of 122 bpm.  Multiple PVCs, no STEMI.  Nonspecific ST changes.  RADIOLOGY CTA chest interpreted by me with rounded pulmonary mass to the right base  Official radiology report(s): CT Angio Chest/Abd/Pel for Dissection W and/or Wo Contrast Result  Date: 07/15/2024 EXAM: CTA CHEST, ABDOMEN AND PELVIS WITH AND WITHOUT CONTRAST 07/15/2024 10:32:48 AM TECHNIQUE: CTA of the chest was performed with and without the administration of intravenous contrast. CTA of the abdomen and pelvis was performed with and without the administration of intravenous contrast. Multiplanar reformatted images are provided for review. MIP images are provided for review. Automated exposure control, iterative reconstruction, and/or weight based adjustment of the mA/kV was utilized to reduce the radiation dose to as low as reasonably achievable. 100 mL of iohexol  (OMNIPAQUE ) 350 MG/ML injection was administered. COMPARISON: 10/26/2020 status post coronary artery bypass graft with dissection is noted. CLINICAL HISTORY: acute back pain, hypertension, leukocytosis 21k FINDINGS: VASCULATURE: AORTA: No acute finding. No abdominal aortic aneurysm. No dissection. PULMONARY ARTERIES: No pulmonary embolism with the limits of this exam. GREAT VESSELS OF AORTIC ARCH: No acute finding. No dissection. No arterial occlusion or significant stenosis. CELIAC TRUNK: No acute finding. No occlusion or significant stenosis. SUPERIOR MESENTERIC ARTERY: Severe narrowing at origin of superior mesenteric artery secondary to calcified plaque. INFERIOR MESENTERIC ARTERY: No acute finding. No occlusion or significant stenosis. RENAL ARTERIES: No acute finding. No occlusion or significant stenosis. ILIAC ARTERIES: No acute finding. No occlusion or significant stenosis. CHEST: MEDIASTINUM: No mediastinal lymphadenopathy. The heart and pericardium demonstrate no acute abnormality. LUNGS AND PLEURA: 5.1 x 3.0 cm rounded mass is noted laterally in the right lung base concerning for malignancy. Calcified pleural plaques are noted bilaterally. Minimal bibasilar subsegmental atelectasis is noted. No focal consolidation or pulmonary edema. No evidence of pleural effusion or pneumothorax. THORACIC BONES AND SOFT TISSUES:  Malignant destruction of posterior portion of right seventh rib is noted consistent with metastatic disease. Small hiatal hernia. ABDOMEN AND PELVIS: LIVER: The liver is unremarkable. GALLBLADDER AND BILE DUCTS: Status post cholecystectomy. No biliary ductal dilatation. SPLEEN: The spleen is unremarkable. PANCREAS: The pancreas is unremarkable. ADRENAL GLANDS: Bilateral adrenal glands demonstrate no acute abnormality. KIDNEYS, URETERS AND BLADDER: Mild urinary bladder distention. No stones in the kidneys or ureters. No hydronephrosis. No perinephric or periureteral stranding. GI AND BOWEL: Stomach and duodenal sweep demonstrate no acute abnormality. There is no bowel obstruction. No abnormal bowel wall thickening or distension. REPRODUCTIVE: Reproductive organs are unremarkable. PERITONEUM AND RETROPERITONEUM: No ascites or free air. LYMPH NODES: No lymphadenopathy. ABDOMINAL BONES AND SOFT TISSUES: Possible acute to subacute mild compression deformity of L1 vertebral body  is noted. No acute soft tissue abnormality. IMPRESSION: 1. Rounded right lung base mass concerning for malignancy , with osseous metastasis involving the posterior right seventh rib. 2. Possible acute to subacute mild compression deformity of L1 vertebral body is noted. 3. Small hiatal hernia. 4. Status post cholecystectomy. Electronically signed by: Lynwood Seip MD 07/15/2024 11:09 AM EST RP Workstation: [id number]    PROCEDURES and INTERVENTIONS:  .1-3 Lead EKG Interpretation  Performed by: Claudene Rover, MD Authorized by: Claudene Rover, MD     Interpretation: normal     ECG rate:  90   ECG rate assessment: normal     Rhythm: sinus rhythm     Ectopy: none     Conduction: normal     Medications  lidocaine (LIDODERM) 5 % 2 patch (2 patches Transdermal Patch Applied 07/15/24 0848)  lactated ringers bolus 500 mL (0 mLs Intravenous Stopped 07/15/24 0933)  methocarbamol (ROBAXIN) tablet 500 mg (500 mg Oral Given 07/15/24 0841)  morphine  (PF) 2 MG/ML injection 2 mg (2 mg Intravenous Given 07/15/24 0848)  morphine (PF) 2 MG/ML injection 2 mg (2 mg Intravenous Given 07/15/24 1011)  ketorolac  (TORADOL ) 30 MG/ML injection 15 mg (15 mg Intravenous Given 07/15/24 1010)  iohexol  (OMNIPAQUE ) 350 MG/ML injection 100 mL (100 mLs Intravenous Contrast Given 07/15/24 1026)  doxycycline (VIBRA-TABS) tablet 100 mg (100 mg Oral Given 07/15/24 1224)     IMPRESSION / MDM / ASSESSMENT AND PLAN / ED COURSE  I reviewed the triage vital signs and the nursing notes.  Differential diagnosis includes, but is not limited to, muscular spasm, cauda equina, sciatica, ruptured AAA, ACS, shingles  {Patient presents with symptoms of an acute illness or injury that is potentially life-threatening.  Patient presents with subacute atraumatic right sided thoracic back pain likely due to a new diagnosis of a pulmonary mass with possible metastases to his rib and possible superimposed pneumonia, ultimately suitable for outpatient management with oncology follow-up.  Reassuring vital signs and general exam, has some localized tenderness.  No neurologic deficits or signs of trauma.  Blood work with leukocytosis.  Negative troponins and nonischemic EKG.  CT, as above.  Lengthy discussion with patient and family about this mass.  Will urgently refer to oncology for follow-up and biopsy.  Due to his cough, leukocytosis I start the patient on 7 days of doxycycline to treat for pneumonia.  Prescribe oxycodone to help with pain control in addition to Tylenol.   Clinical Course as of 07/15/24 1228  Fri Jul 15, 2024  0958 Reassessed.  Patient reports pain had improved transiently with medications but now is recurring.  We discussed leukocytosis he does confirm he has had some congestion and a cough over the past couple days.  We discussed the possibility of pneumonia.  We discussed my recommendations for CT imaging and he is agreeable.  Requesting additional analgesia [DS]   1217 Long discussion regarding CT results, pulmonary mass, adjacent rib lesion.  We discussed plan of care and oncology follow-up, empiric antibiotics considering his cough, congestion and leukocytosis.  Discussed pain control, expectant management, ED return precautions.  Discussed the importance of a biopsy to further elucidate this mass [DS]    Clinical Course User Index [DS] Claudene Rover, MD     FINAL CLINICAL IMPRESSION(S) / ED DIAGNOSES   Final diagnoses:  Right lower lobe lung mass     Rx / DC Orders   ED Discharge Orders          Ordered  Ambulatory referral to Hematology / Oncology       Comments: Your emergency department provider has referred you to see a hematology/oncology specialist. These are physicians who specialize in blood disorders and cancers, or findings concerning for cancer. You will receive a phone call from the [facility] to set up your appointment within 2 business days: [employer] operate Mon - Fri, 8:00 a.m. to 5:00 p.m.; closed for federally recognized holidays. Please be sure your phone is not set to block numbers during this time.   07/15/24 1221    doxycycline (VIBRA-TABS) 100 MG tablet  2 times daily        07/15/24 1222    oxyCODONE (ROXICODONE) 5 MG immediate release tablet  Every 8 hours PRN        07/15/24 1222             Note:  This document was prepared using Dragon voice recognition software and may include unintentional dictation errors.   Claudene Rover, MD 07/15/24 1230

## 2024-07-18 ENCOUNTER — Other Ambulatory Visit: Payer: Self-pay

## 2024-07-18 ENCOUNTER — Inpatient Hospital Stay
Admission: EM | Admit: 2024-07-18 | Discharge: 2024-07-20 | DRG: 871 | Disposition: A | Discharge status: Home / Self Care | Attending: Family Medicine | Admitting: Family Medicine

## 2024-07-18 ENCOUNTER — Encounter: Payer: Self-pay | Admitting: Family Medicine

## 2024-07-18 ENCOUNTER — Ambulatory Visit (INDEPENDENT_AMBULATORY_CARE_PROVIDER_SITE_OTHER): Admitting: Family Medicine

## 2024-07-18 ENCOUNTER — Emergency Department

## 2024-07-18 VITALS — BP 168/70 | HR 126 | Temp 98.9°F | Ht 65.0 in | Wt 158.0 lb

## 2024-07-18 DIAGNOSIS — B9789 Other viral agents as the cause of diseases classified elsewhere: Secondary | ICD-10-CM | POA: Diagnosis not present

## 2024-07-18 DIAGNOSIS — C7951 Secondary malignant neoplasm of bone: Secondary | ICD-10-CM | POA: Diagnosis not present

## 2024-07-18 DIAGNOSIS — I499 Cardiac arrhythmia, unspecified: Secondary | ICD-10-CM | POA: Diagnosis not present

## 2024-07-18 DIAGNOSIS — I252 Old myocardial infarction: Secondary | ICD-10-CM | POA: Diagnosis not present

## 2024-07-18 DIAGNOSIS — J189 Pneumonia, unspecified organism: Principal | ICD-10-CM | POA: Diagnosis present

## 2024-07-18 DIAGNOSIS — Z743 Need for continuous supervision: Secondary | ICD-10-CM | POA: Diagnosis not present

## 2024-07-18 DIAGNOSIS — Z951 Presence of aortocoronary bypass graft: Secondary | ICD-10-CM

## 2024-07-18 DIAGNOSIS — I110 Hypertensive heart disease with heart failure: Secondary | ICD-10-CM | POA: Diagnosis not present

## 2024-07-18 DIAGNOSIS — A419 Sepsis, unspecified organism: Principal | ICD-10-CM | POA: Diagnosis present

## 2024-07-18 DIAGNOSIS — R000 Tachycardia, unspecified: Secondary | ICD-10-CM | POA: Insufficient documentation

## 2024-07-18 DIAGNOSIS — I2585 Chronic coronary microvascular dysfunction: Secondary | ICD-10-CM | POA: Diagnosis not present

## 2024-07-18 DIAGNOSIS — Z955 Presence of coronary angioplasty implant and graft: Secondary | ICD-10-CM

## 2024-07-18 DIAGNOSIS — E785 Hyperlipidemia, unspecified: Secondary | ICD-10-CM | POA: Diagnosis not present

## 2024-07-18 DIAGNOSIS — R0902 Hypoxemia: Secondary | ICD-10-CM

## 2024-07-18 DIAGNOSIS — Z961 Presence of intraocular lens: Secondary | ICD-10-CM | POA: Diagnosis present

## 2024-07-18 DIAGNOSIS — R0989 Other specified symptoms and signs involving the circulatory and respiratory systems: Secondary | ICD-10-CM | POA: Diagnosis not present

## 2024-07-18 DIAGNOSIS — Z8249 Family history of ischemic heart disease and other diseases of the circulatory system: Secondary | ICD-10-CM

## 2024-07-18 DIAGNOSIS — Z79899 Other long term (current) drug therapy: Secondary | ICD-10-CM

## 2024-07-18 DIAGNOSIS — R634 Abnormal weight loss: Secondary | ICD-10-CM | POA: Diagnosis present

## 2024-07-18 DIAGNOSIS — I5043 Acute on chronic combined systolic (congestive) and diastolic (congestive) heart failure: Secondary | ICD-10-CM | POA: Diagnosis not present

## 2024-07-18 DIAGNOSIS — I2510 Atherosclerotic heart disease of native coronary artery without angina pectoris: Secondary | ICD-10-CM | POA: Diagnosis present

## 2024-07-18 DIAGNOSIS — Z7982 Long term (current) use of aspirin: Secondary | ICD-10-CM | POA: Diagnosis not present

## 2024-07-18 DIAGNOSIS — I5A Non-ischemic myocardial injury (non-traumatic): Secondary | ICD-10-CM | POA: Diagnosis present

## 2024-07-18 DIAGNOSIS — I2489 Other forms of acute ischemic heart disease: Secondary | ICD-10-CM | POA: Diagnosis present

## 2024-07-18 DIAGNOSIS — D649 Anemia, unspecified: Secondary | ICD-10-CM | POA: Diagnosis not present

## 2024-07-18 DIAGNOSIS — I10 Essential (primary) hypertension: Secondary | ICD-10-CM | POA: Diagnosis present

## 2024-07-18 DIAGNOSIS — Z9049 Acquired absence of other specified parts of digestive tract: Secondary | ICD-10-CM

## 2024-07-18 DIAGNOSIS — C3491 Malignant neoplasm of unspecified part of right bronchus or lung: Secondary | ICD-10-CM | POA: Diagnosis not present

## 2024-07-18 DIAGNOSIS — R0600 Dyspnea, unspecified: Principal | ICD-10-CM

## 2024-07-18 DIAGNOSIS — K2270 Barrett's esophagus without dysplasia: Secondary | ICD-10-CM | POA: Diagnosis not present

## 2024-07-18 DIAGNOSIS — Z860100 Personal history of colon polyps, unspecified: Secondary | ICD-10-CM

## 2024-07-18 DIAGNOSIS — R918 Other nonspecific abnormal finding of lung field: Secondary | ICD-10-CM | POA: Diagnosis present

## 2024-07-18 DIAGNOSIS — E222 Syndrome of inappropriate secretion of antidiuretic hormone: Secondary | ICD-10-CM | POA: Diagnosis not present

## 2024-07-18 DIAGNOSIS — E871 Hypo-osmolality and hyponatremia: Secondary | ICD-10-CM | POA: Diagnosis not present

## 2024-07-18 DIAGNOSIS — I959 Hypotension, unspecified: Secondary | ICD-10-CM | POA: Diagnosis not present

## 2024-07-18 DIAGNOSIS — D630 Anemia in neoplastic disease: Secondary | ICD-10-CM | POA: Diagnosis present

## 2024-07-18 DIAGNOSIS — Z6825 Body mass index (BMI) 25.0-25.9, adult: Secondary | ICD-10-CM

## 2024-07-18 DIAGNOSIS — M4856XA Collapsed vertebra, not elsewhere classified, lumbar region, initial encounter for fracture: Secondary | ICD-10-CM | POA: Diagnosis present

## 2024-07-18 DIAGNOSIS — Z885 Allergy status to narcotic agent status: Secondary | ICD-10-CM

## 2024-07-18 DIAGNOSIS — Z1152 Encounter for screening for COVID-19: Secondary | ICD-10-CM | POA: Diagnosis not present

## 2024-07-18 DIAGNOSIS — J929 Pleural plaque without asbestos: Secondary | ICD-10-CM | POA: Diagnosis not present

## 2024-07-18 DIAGNOSIS — Z9841 Cataract extraction status, right eye: Secondary | ICD-10-CM

## 2024-07-18 DIAGNOSIS — Z9842 Cataract extraction status, left eye: Secondary | ICD-10-CM

## 2024-07-18 DIAGNOSIS — R0602 Shortness of breath: Secondary | ICD-10-CM | POA: Diagnosis not present

## 2024-07-18 DIAGNOSIS — Z888 Allergy status to other drugs, medicaments and biological substances status: Secondary | ICD-10-CM

## 2024-07-18 LAB — COMPREHENSIVE METABOLIC PANEL WITH GFR
ALT: 23 U/L (ref 0–44)
AST: 43 U/L — ABNORMAL HIGH (ref 15–41)
Albumin: 3 g/dL — ABNORMAL LOW (ref 3.5–5.0)
Alkaline Phosphatase: 160 U/L — ABNORMAL HIGH (ref 38–126)
Anion gap: 12 (ref 5–15)
BUN: 16 mg/dL (ref 8–23)
CO2: 23 mmol/L (ref 22–32)
Calcium: 8.4 mg/dL — ABNORMAL LOW (ref 8.9–10.3)
Chloride: 93 mmol/L — ABNORMAL LOW (ref 98–111)
Creatinine, Ser: 0.7 mg/dL (ref 0.61–1.24)
GFR, Estimated: >60 mL/min (ref >60)
Glucose, Bld: 94 mg/dL (ref 70–99)
Potassium: 4.1 mmol/L (ref 3.5–5.1)
Sodium: 128 mmol/L — ABNORMAL LOW (ref 135–145)
Total Bilirubin: 1.1 mg/dL (ref 0.0–1.2)
Total Protein: 7 g/dL (ref 6.5–8.1)

## 2024-07-18 LAB — BASIC METABOLIC PANEL WITH GFR
Anion gap: 10 (ref 5–15)
BUN: 15 mg/dL (ref 8–23)
CO2: 22 mmol/L (ref 22–32)
Calcium: 7.7 mg/dL — ABNORMAL LOW (ref 8.9–10.3)
Chloride: 96 mmol/L — ABNORMAL LOW (ref 98–111)
Creatinine, Ser: 0.58 mg/dL — ABNORMAL LOW (ref 0.61–1.24)
GFR, Estimated: >60 mL/min
Glucose, Bld: 167 mg/dL — ABNORMAL HIGH (ref 70–99)
Potassium: 3.5 mmol/L (ref 3.5–5.1)
Sodium: 128 mmol/L — ABNORMAL LOW (ref 135–145)

## 2024-07-18 LAB — OSMOLALITY: Osmolality: 269 mosm/kg — ABNORMAL LOW (ref 275–295)

## 2024-07-18 LAB — CBC WITH DIFFERENTIAL/PLATELET
Abs Immature Granulocytes: 0.48 K/uL — ABNORMAL HIGH (ref 0.00–0.07)
Basophils Absolute: 0.1 K/uL (ref 0.0–0.1)
Basophils Relative: 0 %
Eosinophils Absolute: 0.1 K/uL (ref 0.0–0.5)
Eosinophils Relative: 0 %
HCT: 34.7 % — ABNORMAL LOW (ref 39.0–52.0)
Hemoglobin: 11.6 g/dL — ABNORMAL LOW (ref 13.0–17.0)
Immature Granulocytes: 2 %
Lymphocytes Relative: 7 %
Lymphs Abs: 1.6 K/uL (ref 0.7–4.0)
MCH: 29 pg (ref 26.0–34.0)
MCHC: 33.4 g/dL (ref 30.0–36.0)
MCV: 86.8 fL (ref 80.0–100.0)
Monocytes Absolute: 2.2 K/uL — ABNORMAL HIGH (ref 0.1–1.0)
Monocytes Relative: 10 %
Neutro Abs: 17.7 K/uL — ABNORMAL HIGH (ref 1.7–7.7)
Neutrophils Relative %: 81 %
Platelets: 162 K/uL (ref 150–400)
RBC: 4 MIL/uL — ABNORMAL LOW (ref 4.22–5.81)
RDW: 16.9 % — ABNORMAL HIGH (ref 11.5–15.5)
WBC: 22.1 K/uL — ABNORMAL HIGH (ref 4.0–10.5)
nRBC: 0 % (ref 0.0–0.2)

## 2024-07-18 LAB — LACTIC ACID, PLASMA
Lactic Acid, Venous: 1 mmol/L (ref 0.5–1.9)
Lactic Acid, Venous: 2.6 mmol/L (ref 0.5–1.9)

## 2024-07-18 LAB — BRAIN NATRIURETIC PEPTIDE: B Natriuretic Peptide: 315.4 pg/mL — ABNORMAL HIGH (ref 0.0–100.0)

## 2024-07-18 LAB — TROPONIN I (HIGH SENSITIVITY)
Troponin I (High Sensitivity): 25 ng/L — ABNORMAL HIGH (ref <18)
Troponin I (High Sensitivity): 28 ng/L — ABNORMAL HIGH (ref <18)

## 2024-07-18 LAB — MAGNESIUM: Magnesium: 1.8 mg/dL (ref 1.7–2.4)

## 2024-07-18 MED ORDER — MAGNESIUM SULFATE 2 GM/50ML IV SOLN
2.0000 g | Freq: Once | INTRAVENOUS | Status: AC
  Administered 2024-07-18: 2 g via INTRAVENOUS
  Filled 2024-07-18: qty 50

## 2024-07-18 MED ORDER — ROSUVASTATIN CALCIUM 10 MG PO TABS
20.0000 mg | ORAL_TABLET | Freq: Every day | ORAL | Status: DC
  Administered 2024-07-18 – 2024-07-20 (×3): 20 mg via ORAL
  Filled 2024-07-18 (×3): qty 2

## 2024-07-18 MED ORDER — METOPROLOL TARTRATE 25 MG PO TABS
25.0000 mg | ORAL_TABLET | Freq: Two times a day (BID) | ORAL | Status: DC
  Administered 2024-07-19 – 2024-07-20 (×3): 25 mg via ORAL
  Filled 2024-07-18 (×4): qty 1

## 2024-07-18 MED ORDER — SODIUM CHLORIDE 0.9 % IV BOLUS
500.0000 mL | Freq: Once | INTRAVENOUS | Status: AC
  Administered 2024-07-18: 500 mL via INTRAVENOUS

## 2024-07-18 MED ORDER — HEPARIN SODIUM (PORCINE) 5000 UNIT/ML IJ SOLN
5000.0000 [IU] | Freq: Three times a day (TID) | INTRAMUSCULAR | Status: DC
  Administered 2024-07-18 – 2024-07-20 (×4): 5000 [IU] via SUBCUTANEOUS
  Filled 2024-07-18 (×4): qty 1

## 2024-07-18 MED ORDER — LISINOPRIL 10 MG PO TABS
20.0000 mg | ORAL_TABLET | Freq: Every day | ORAL | Status: DC
  Administered 2024-07-18: 20 mg via ORAL

## 2024-07-18 MED ORDER — ALBUTEROL SULFATE HFA 108 (90 BASE) MCG/ACT IN AERS: 2.0000 | INHALATION_SPRAY | RESPIRATORY_TRACT | Status: DC | PRN

## 2024-07-18 MED ORDER — ACETAMINOPHEN 325 MG PO TABS
650.0000 mg | ORAL_TABLET | Freq: Four times a day (QID) | ORAL | Status: DC | PRN
  Administered 2024-07-19: 650 mg via ORAL
  Filled 2024-07-18 (×2): qty 2

## 2024-07-18 MED ORDER — SODIUM CHLORIDE 1 G PO TABS
1.0000 g | ORAL_TABLET | Freq: Two times a day (BID) | ORAL | Status: DC
  Administered 2024-07-18 – 2024-07-20 (×4): 1 g via ORAL
  Filled 2024-07-18 (×4): qty 1

## 2024-07-18 MED ORDER — ASPIRIN 81 MG PO TBEC
81.0000 mg | DELAYED_RELEASE_TABLET | Freq: Every day | ORAL | Status: DC
  Administered 2024-07-18 – 2024-07-19 (×2): 81 mg via ORAL
  Filled 2024-07-18 (×3): qty 1

## 2024-07-18 MED ORDER — ALBUTEROL SULFATE (2.5 MG/3ML) 0.083% IN NEBU: 2.5000 mg | INHALATION_SOLUTION | RESPIRATORY_TRACT | Status: DC | PRN

## 2024-07-18 MED ORDER — METOPROLOL TARTRATE 5 MG/5ML IV SOLN: 2.5000 mg | INTRAVENOUS | Status: DC | PRN

## 2024-07-18 MED ORDER — NITROGLYCERIN 0.4 MG SL SUBL
0.4000 mg | SUBLINGUAL_TABLET | SUBLINGUAL | Status: DC | PRN
Start: 2024-07-18 — End: 2024-07-20

## 2024-07-18 MED ORDER — ADULT MULTIVITAMIN W/MINERALS CH
1.0000 | ORAL_TABLET | Freq: Every day | ORAL | Status: DC
  Administered 2024-07-18 – 2024-07-20 (×3): 1 via ORAL
  Filled 2024-07-18 (×2): qty 1

## 2024-07-18 MED ORDER — ONDANSETRON HCL 4 MG/2ML IJ SOLN: 4.0000 mg | Freq: Three times a day (TID) | INTRAMUSCULAR | Status: DC | PRN

## 2024-07-18 MED ORDER — SODIUM CHLORIDE 0.9 % IV SOLN
500.0000 mg | Freq: Once | INTRAVENOUS | Status: AC
  Administered 2024-07-18: 500 mg via INTRAVENOUS
  Filled 2024-07-18: qty 5

## 2024-07-18 MED ORDER — HYDRALAZINE HCL 20 MG/ML IJ SOLN: 5.0000 mg | INTRAMUSCULAR | Status: DC | PRN

## 2024-07-18 MED ORDER — FUROSEMIDE 10 MG/ML IJ SOLN
40.0000 mg | Freq: Once | INTRAMUSCULAR | Status: AC
  Administered 2024-07-18: 40 mg via INTRAVENOUS
  Filled 2024-07-18: qty 4

## 2024-07-18 MED ORDER — SODIUM CHLORIDE 0.9 % IV SOLN
1.0000 g | Freq: Once | INTRAVENOUS | Status: AC
  Administered 2024-07-18: 1 g via INTRAVENOUS
  Filled 2024-07-18: qty 10

## 2024-07-18 MED ORDER — OXYCODONE HCL 5 MG PO TABS
5.0000 mg | ORAL_TABLET | Freq: Three times a day (TID) | ORAL | Status: DC | PRN
  Administered 2024-07-18 – 2024-07-20 (×3): 5 mg via ORAL
  Filled 2024-07-18 (×4): qty 1

## 2024-07-18 MED ORDER — OXYCODONE-ACETAMINOPHEN 5-325 MG PO TABS
1.0000 | ORAL_TABLET | Freq: Once | ORAL | Status: AC
  Administered 2024-07-18: 1 via ORAL
  Filled 2024-07-18: qty 1

## 2024-07-18 MED ORDER — PANTOPRAZOLE SODIUM 40 MG PO TBEC
40.0000 mg | DELAYED_RELEASE_TABLET | Freq: Every day | ORAL | Status: DC
  Administered 2024-07-18 – 2024-07-20 (×3): 40 mg via ORAL
  Filled 2024-07-18 (×2): qty 1

## 2024-07-18 MED ORDER — METOPROLOL TARTRATE 25 MG PO TABS
12.5000 mg | ORAL_TABLET | Freq: Two times a day (BID) | ORAL | Status: DC
  Administered 2024-07-18: 12.5 mg via ORAL

## 2024-07-18 MED ORDER — VITAMIN D 25 MCG (1000 UNIT) PO TABS
2000.0000 [IU] | ORAL_TABLET | Freq: Every day | ORAL | Status: DC
  Administered 2024-07-18 – 2024-07-20 (×3): 2000 [IU] via ORAL
  Filled 2024-07-18 (×2): qty 2

## 2024-07-18 MED ORDER — DM-GUAIFENESIN ER 30-600 MG PO TB12: 1.0000 | ORAL_TABLET | Freq: Two times a day (BID) | ORAL | Status: DC | PRN

## 2024-07-18 NOTE — Progress Notes (Unsigned)
 Recent CT d/w pt.   IMPRESSION: 1. Rounded right lung base mass concerning for malignancy , with osseous metastasis involving the posterior right seventh rib. 2. Possible acute to subacute mild compression deformity of L1 vertebral body is noted. 3. Small hiatal hernia. 4. Status post cholecystectomy.  H/o elevated WBC.  Discharged from ER with doxycycline.  Here today for follow up.   He has some SOB.  He is still having chills.  Tachycardia noted.    He had been confused prev, noted today with conversation.   He has pending referral for oncology.  Patient hasn't heard about scheduling yet.   He is not taking tramadol.    Current Outpatient Medications on File Prior to Visit  Medication Sig Dispense Refill   aspirin 81 MG EC tablet Take 81 mg by mouth daily.     Cholecalciferol (VITAMIN D3) 2000 units TABS Take by mouth daily.     doxycycline (VIBRA-TABS) 100 MG tablet Take 1 tablet (100 mg total) by mouth 2 (two) times daily for 7 days. 14 tablet 0   lisinopril  (ZESTRIL ) 20 MG tablet Take 1 tablet (20 mg total) by mouth daily. 90 tablet 3   metoprolol  tartrate (LOPRESSOR ) 25 MG tablet TAKE 0.5 TABLETS BY MOUTH 2 TIMES DAILY. 90 tablet 3   Multiple Vitamins-Minerals (MULTIVITAMIN) tablet Take 1 tablet by mouth daily.     nitroGLYCERIN  (NITROSTAT ) 0.4 MG SL tablet DISSOLVE ONE TABLET UNDER TONGUE EVERY 5 MINUTES AS NEEDED FOR CHEST PAIN 25 tablet 1   omeprazole  (PRILOSEC) 20 MG capsule Take 1 capsule (20 mg total) by mouth every Monday, Wednesday, and Friday. 45 capsule 3   oxyCODONE (ROXICODONE) 5 MG immediate release tablet Take 1 tablet (5 mg total) by mouth every 8 (eight) hours as needed. 20 tablet 0   rosuvastatin  (CRESTOR ) 20 MG tablet Take 1 tablet (20 mg total) by mouth daily. 90 tablet 3   No current facility-administered medications on file prior to visit.   Today's Vitals   07/18/24 1439 07/18/24 1448  BP: (!) 168/70 (!) 168/70  Pulse: (!) 126 (!) 126  Temp: 98.9 F  (37.2 C)   TempSrc: Oral   SpO2: 96%   Weight: 158 lb (71.7 kg)   Height: 5' 5 (1.651 m)    Body mass index is 26.29 kg/m.   Meds, vitals, and allergies reviewed.   ROS: Per HPI unless specifically indicated in ROS section   Nad but having chills ncat He is pleasant in conversation but is slightly confused with answering questions.  Neck supple  Tachycardia noted.  IRR No focal dec in BS. Speaking in complete sentences.   Skin well perfused.   30 minutes were devoted to patient care in this encounter (this includes time spent reviewing the patient's file/history, interviewing and examining the patient, counseling/reviewing plan with patient).

## 2024-07-18 NOTE — ED Notes (Signed)
 Pt ambulated to restroom with stand by assistance. Pt now back into bed, on CCM and pulse ox, denies any needs at this time, family at bedside

## 2024-07-18 NOTE — ED Triage Notes (Signed)
 Pt arrived via EMS from a PCP office for a follow up with a mass in the chest on a lung. Per EMS pt was told to come to the ED due to his work of breathing as well as having low WBC. Pt has been placed on o2 due to pt work of breathing and pt sts that he is feeling better.

## 2024-07-18 NOTE — Assessment & Plan Note (Addendum)
 With known lung mass, with concern for PNA in spite of doxy use.  Given tachycardia and confusion, EMS called and in route, ER dispo.  I will check on oncology referral.   He agrees with EMS transport.

## 2024-07-18 NOTE — Sepsis Progress Note (Signed)
 Following for sepsis monitoring ?

## 2024-07-18 NOTE — Consult Note (Signed)
 CODE SEPSIS - PHARMACY COMMUNICATION  **Broad Spectrum Antibiotics should be administered within 1 hour of Sepsis diagnosis**  Time Code Sepsis Called/Page Received: 1905  Antibiotics Ordered: azithromycin, ceftriaxone   Time of 1st antibiotic administration: 1928  Additional action taken by pharmacy: n/a  If necessary, Name of Provider/Nurse Contacted: n/a    Sandra Brents A Fleurette Woolbright ,PharmD Clinical Pharmacist  07/18/2024  7:24 PM

## 2024-07-18 NOTE — ED Provider Notes (Signed)
 [facility] Provider Note    Event Date/Time   First MD Initiated Contact with Patient 07/18/24 1658     (approximate)  History   Chief Complaint: Shortness of Breath  HPI  Barry Small is a 89 y.o. male with a past medical history of gastric reflux, hypertension, hyperlipidemia, presents to the emergency department for shortness of breath.  According to the patient and report patient was at his primary care doctor's office today he was there for a checkup had recently been diagnosed with a chest mass and are currently awaiting oncology follow-up.  While at the office he had some lab work performed on Friday when he also had a CT scan angiography chest abdomen and pelvis performed.  CT scan shows a right lung mass as well as some signs of local metastases.  Also possible L1 mild compression fracture.  Lab work performed on Friday also showed significant leukocytosis with a white blood cell count of 21,000.  As the patient appeared somewhat short of breath and given the abnormal lab work performed on Friday the PCP sent him to the emergency department for more expedited workup.  Here the patient appears well, no distress satting in the low 90s on room air with no baseline O2 requirement.  Physical Exam   Triage Vital Signs: ED Triage Vitals  Encounter Vitals Group     BP 07/18/24 1658 (!) 157/112     Girls Systolic BP Percentile --      Girls Diastolic BP Percentile --      Boys Systolic BP Percentile --      Boys Diastolic BP Percentile --      Pulse Rate 07/18/24 1658 (!) 130     Resp 07/18/24 1658 17     Temp 07/18/24 1658 98.7 F (37.1 C)     Temp Source 07/18/24 1658 Oral     SpO2 07/18/24 1658 92 %     Weight 07/18/24 1659 158 lb (71.7 kg)     Height 07/18/24 1659 5' 5 (1.651 m)     Head Circumference --      Peak Flow --      Pain Score 07/18/24 1659 6     Pain Loc --      Pain Education --      Exclude from Growth Chart --     Most  recent vital signs: Vitals:   07/18/24 1658  BP: (!) 157/112  Pulse: (!) 130  Resp: 17  Temp: 98.7 F (37.1 C)  SpO2: 92%    General: Awake, no distress.  CV:  Good peripheral perfusion.  Regular rate and rhythm  Resp:  Normal effort.  Equal breath sounds bilaterally.  No obvious wheeze rales or rhonchi. Abd:  No distention.  Soft, nontender.  No rebound or guarding.  ED Results / Procedures / Treatments   EKG  EKG viewed and interpreted by myself shows sinus tachycardia 139 bpm with a narrow QRS, normal axis, normal intervals, no concerning ST changes.  RADIOLOGY  I reviewed the patient's outpatient CT angio chest abdomen and pelvis performed 3 days ago which shows a right sided lung mass possible local metastatic disease as well as a mild L1 compression fracture. I have reviewed interpret the chest x-ray images.  Hypoventilation difficult to exclude opacity Patchy bibasilar opacities atelectasis versus pneumonia read by radiology with vascular congestion.  MEDICATIONS ORDERED IN ED: Medications  sodium chloride  0.9 % bolus 500 mL (has no administration in time  range)     IMPRESSION / MDM / ASSESSMENT AND PLAN / ED COURSE  I reviewed the triage vital signs and the nursing notes.  Patient's presentation is most consistent with acute presentation with potential threat to life or bodily function.  Patient presents emergency department for a lung mass as well as elevated white blood cell count performed on Wednesday found to be tachycardic with a lower O2 saturation.  Here patient continues to sat in the low 90s on room air, with a pulse rate initially around 130 bpm.  Will IV hydrate.  Will check labs in the emergency department including a CBC with differential given his leukocytosis of 21,003 days ago.  Will obtain a portable chest x-ray.  We will continue to closely monitor while awaiting lab results including CBC chemistry and a cardiac enzyme.  Patient now requiring 2 L to  maintain sats around 9192%.  Briefly dipped into the upper 80s on room air.  Lab work today has resulted showing significant leukocytosis once again of 22,000.  Given the patient's tachycardia tachypnea with hypoxia into the upper 80s on room air and leukocytosis meeting sepsis criteria we will cover with antibiotics and send blood cultures.  Patient's troponin slightly elevated at 25 could represent more demand ischemia given his tachycardia.  BNP of 315 we will be somewhat cautious with her fluid administration and limited to 500 cc of fluids currently.  Chemistry shows mild hyponatremia no other significant finding.  I spoke to Dr. Phenergan of oncology he states given the mass the white count could be more reactive from the mass however difficult to rule out infection and he would recommend covering with antibiotics as a precaution he will see as a consult tomorrow.  Will admit to the hospital service for further workup and treatment.  CRITICAL CARE Performed by: Franky Moores   Total critical care time: 30 minutes  Critical care time was exclusive of separately billable procedures and treating other patients.  Critical care was necessary to treat or prevent imminent or life-threatening deterioration.  Critical care was time spent personally by me on the following activities: development of treatment plan with patient and/or surrogate as well as nursing, discussions with consultants, evaluation of patient's response to treatment, examination of patient, obtaining history from patient or surrogate, ordering and performing treatments and interventions, ordering and review of laboratory studies, ordering and review of radiographic studies, pulse oximetry and re-evaluation of patient's condition.   FINAL CLINICAL IMPRESSION(S) / ED DIAGNOSES   Sepsis Dyspnea Hypoxia   Note:  This document was prepared using Dragon voice recognition software and may include unintentional dictation  errors.   Moores Franky, MD 07/18/24 1907

## 2024-07-18 NOTE — H&P (Signed)
 History and Physical    Barry Small FMW:[id number] DOB: 1934-05-09 DOA: 07/18/2024  Referring MD/NP/PA:   PCP: [name], MD   Patient coming from:  The patient is coming from home.     Chief Complaint: SOB  HPI: Barry Small is a 89 y.o. male with medical history significant of recently found lung mass, sCHF with EF 45-505,  HTN, HLD, CAD, s/p of CABG, chronic tachycardia, bigeminy, tremor, Barrett's esophagus, bilateral proptosis, who presents with shortness of breath.  Patient states that he has SOB for more than 2 weeks.  He has cough with white mucus production.  No chest pain.  He had fever 100.1 earlier today at  home.  His temperature is normal 98.7 in ED.  Pt states that also has lower back pain. Pt was seen in ED on 11/7 and had CTA of chest/abdomen/pelvis for dissection which showed lung mass concerning for malignancy, with osteo metastasis. Pt was given referral to oncology to follow-up.  Patient states that he had more than 20 pounds weight loss recently.  No nausea, vomiting, diarrhea or abdominal pain.  No symptoms of UTI. Pt had Lab work performed on Friday by his PCP, which showed leukocytosis with with WBC of 21. Pt was sent to ED for further evaluation treatment by PCP. Pt was started on doxycycline on 11/7 without improvement.   CTA of chest/abdomen/pelvis for dissection on 07/15/24 1. Rounded right lung base mass concerning for malignancy , with osseous metastasis involving the posterior right seventh rib. 2. Possible acute to subacute mild compression deformity of L1 vertebral body is noted. 3. Small hiatal hernia. 4. Status post cholecystectomy.   Data reviewed independently and ED Course: pt was found to have WBC 22.1, troponin 25, sodium 128, potassium 4.1, magnesium 1.8, BNP 315.4, temperature normal, blood pressure 145/84, heart rate of 120-130, RR 28, oxygen saturation 89% on room air, which improved to 92-99% on 2 L oxygen.  Patient is admitted  to PCU as inpatient.   Chest x-ray: 1. Patchy bibasilar opacities, atelectasis versus pneumonia. 2. Cardiomegaly with pulmonary vascular congestion. 3. Calcified left pleural plaque.    EKG: I have personally reviewed.  Sinus rhythm, QTc 470, heart rate of 139, PVC, poor R wave progression, T wave inversion in the inferior leads.   Review of Systems:   General: had fevers at home, no chills, has weight loss,` has fatigue HEENT: no blurry vision, hearing changes or sore throat Respiratory: has dyspnea, coughing, no wheezing CV: no chest pain, no palpitations GI: no nausea, vomiting, abdominal pain, diarrhea, constipation GU: no dysuria, burning on urination, increased urinary frequency, hematuria  Ext: no leg edema Neuro: no unilateral weakness, numbness, or tingling, no vision change or hearing loss Skin: no rash, no skin tear. MSK: No muscle spasm, no deformity, no limitation of range of movement in spin Heme: No easy bruising.  Travel history: No recent long distant travel.   Allergy:  Allergies  Allergen Reactions   Atorvastatin Other (See Comments)    REACTION: myalgia   Morphine Other (See Comments)    REACTION: syncope- lower BP but may be able to tolerate low dose of med.    Simvastatin Other (See Comments)    REACTION: ? reaction    Past Medical History:  Diagnosis Date   Barrett's esophagus    Colon polyps 04/2003   type unknown    Coronary artery disease    Elevated bilirubin    Elevated glucose    GERD (  gastroesophageal reflux disease)    Hyperlipidemia    Hypertension    Myocardial infarction [facility])    Pneumonia 2014    Past Surgical History:  Procedure Laterality Date   BELPHAROPTOSIS REPAIR  08/2018   Cardiolite  12/2001   h/o vessel infarct EF 66%   CATARACT EXTRACTION W/ INTRAOCULAR LENS  IMPLANT, BILATERAL  09/2018   CHOLECYSTECTOMY  09/2001   COLONOSCOPY  11/15/2008   Mild divertics, 5 yrs, Dr. Aneita   COLONOSCOPY W/ POLYPECTOMY   05/09/2003   x multiple, Dr. Viktoria   CORONARY ANGIOPLASTY  2001   x 2, Dr.Pulsipher   CORONARY ANGIOPLASTY WITH STENT PLACEMENT  12/05/2005   CORONARY ARTERY BYPASS GRAFT  1989   [facility]   ESOPHAGOGASTRODUODENOSCOPY  05/09/2003   Barrett's   ESOPHAGOGASTRODUODENOSCOPY  11/17/08   Barrett's Esoph [facility] (Dr. Aneita)  2 years   EYE SURGERY  09/2018   cataract extraction   [facility]  03/28-03/31/2007   Chest pain   NASAL SINUS SURGERY  06/23/2008   Dr.Clark   NOSE SURGERY  06/23/2008   Dr. Gretta   Stress Myoview   09/30/2004   Normal EF 70%   Stress Myoview   02/11/2006   No change, c/w 09/2004   Stress Myoview   02/22/2008   Abnormal w/prior inf infarct  No signif ischemia    Social History:  reports that he has never smoked. He has never used smokeless tobacco. He reports current alcohol use. He reports that he does not use drugs.  Family History:  Family History  Problem Relation Age of Onset   Heart disease Father    Pneumonia Mother    Colon cancer Neg Hx    Prostate cancer Neg Hx      Prior to Admission medications   Medication Sig Start Date End Date Taking? Authorizing Provider  aspirin 81 MG EC tablet Take 81 mg by mouth daily.    [provider]  Cholecalciferol (VITAMIN D3) 2000 units TABS Take by mouth daily.    [provider]  doxycycline (VIBRA-TABS) 100 MG tablet Take 1 tablet (100 mg total) by mouth 2 (two) times daily for 7 days. 07/15/24 07/22/24  Claudene Rover, MD  lisinopril  (ZESTRIL ) 20 MG tablet Take 1 tablet (20 mg total) by mouth daily. 04/07/24   [name], MD  metoprolol  tartrate (LOPRESSOR ) 25 MG tablet TAKE 0.5 TABLETS BY MOUTH 2 TIMES DAILY. 04/07/24   [name], MD  Multiple Vitamins-Minerals (MULTIVITAMIN) tablet Take 1 tablet by mouth daily. 04/07/24   [name], MD  nitroGLYCERIN  (NITROSTAT ) 0.4 MG SL tablet DISSOLVE ONE TABLET UNDER TONGUE EVERY 5 MINUTES AS NEEDED FOR CHEST PAIN 04/04/24   Gollan, Timothy J,  MD  omeprazole  (PRILOSEC) 20 MG capsule Take 1 capsule (20 mg total) by mouth every Monday, Wednesday, and Friday. 04/08/24   [name], MD  oxyCODONE (ROXICODONE) 5 MG immediate release tablet Take 1 tablet (5 mg total) by mouth every 8 (eight) hours as needed. 07/15/24 07/15/25  Claudene Rover, MD  rosuvastatin  (CRESTOR ) 20 MG tablet Take 1 tablet (20 mg total) by mouth daily. 04/07/24   [name], MD    Physical Exam: Vitals:   07/18/24 1730 07/18/24 1800 07/18/24 2144 07/19/24 0128  BP: 136/70 (!) 145/84 115/61 (!) 105/45  Pulse: (!) 120 (!) 121 (!) 102 76  Resp: (!) 27 (!) 28 (!) 26 20  Temp:   98.6 F (37 C) 98.7 F (37.1 C)  TempSrc:  Oral   SpO2: 100% 99% 95% 93%  Weight:      Height:       General: Not in acute distress HEENT:       Eyes: PERRL, EOMI, no jaundice       ENT: No discharge from the ears and nose, no pharynx injection, no tonsillar enlargement.        Neck: positive JVD, no bruit, no mass felt. Heme: No neck lymph node enlargement. Cardiac: S1/S2, RRR, No murmurs, No gallops or rubs. Respiratory: Has fine crackles bilaterally GI: Soft, nondistended, nontender, no rebound pain, no organomegaly, BS present. GU: No hematuria Ext: No pitting leg edema bilaterally. 1+DP/PT pulse bilaterally. Musculoskeletal: No joint deformities, No joint redness or warmth, no limitation of ROM in spin. Skin: No rashes.  Neuro: Alert, oriented X3, cranial nerves II-XII grossly intact, moves all extremities normally.  Psych: Patient is not psychotic, no suicidal or hemocidal ideation.  Labs on Admission: I have personally reviewed following labs and imaging studies  CBC: Recent Labs  Lab 07/15/24 0812 07/18/24 1710  WBC 21.2* 22.1*  NEUTROABS  --  17.7*  HGB 12.2* 11.6*  HCT 35.6* 34.7*  MCV 85.0 86.8  PLT 202 162   Basic Metabolic Panel: Recent Labs  Lab 07/15/24 0812 07/18/24 1710 07/18/24 2229  NA 130* 128* 128*  K 3.8 4.1 3.5  CL 97* 93* 96*  CO2  21* 23 22  GLUCOSE 118* 94 167*  BUN 12 16 15   CREATININE 0.52* 0.70 0.58*  CALCIUM  8.4* 8.4* 7.7*  MG  --  1.8  --    GFR: Estimated Creatinine Clearance: 55.5 mL/min (A) (by C-G formula based on SCr of 0.58 mg/dL (L)). Liver Function Tests: Recent Labs  Lab 07/15/24 0812 07/18/24 1710  AST 38 43*  ALT 26 23  ALKPHOS 161* 160*  BILITOT 1.2 1.1  PROT 6.8 7.0  ALBUMIN 3.1* 3.0*   No results for input(s): LIPASE, AMYLASE in the last 168 hours. No results for input(s): AMMONIA in the last 168 hours. Coagulation Profile: Recent Labs  Lab 07/19/24 0121  INR 1.2   Cardiac Enzymes: No results for input(s): CKTOTAL, CKMB, CKMBINDEX, TROPONINI in the last 168 hours. BNP (last 3 results) No results for input(s): PROBNP in the last 8760 hours. HbA1C: No results for input(s): HGBA1C in the last 72 hours. CBG: No results for input(s): GLUCAP in the last 168 hours. Lipid Profile: No results for input(s): CHOL, HDL, LDLCALC, TRIG, CHOLHDL, LDLDIRECT in the last 72 hours. Thyroid Function Tests: No results for input(s): TSH, T4TOTAL, FREET4, T3FREE, THYROIDAB in the last 72 hours. Anemia Panel: No results for input(s): VITAMINB12, FOLATE, FERRITIN, TIBC, IRON, RETICCTPCT in the last 72 hours. Urine analysis:    Component Value Date/Time   COLORURINE YELLOW (A) 07/15/2024 1016   APPEARANCEUR CLOUDY (A) 07/15/2024 1016   LABSPEC 1.015 07/15/2024 1016   PHURINE 7.0 07/15/2024 1016   GLUCOSEU NEGATIVE 07/15/2024 1016   HGBUR NEGATIVE 07/15/2024 1016   HGBUR negative 08/12/2010 1451   BILIRUBINUR NEGATIVE 07/15/2024 1016   KETONESUR NEGATIVE 07/15/2024 1016   PROTEINUR NEGATIVE 07/15/2024 1016   UROBILINOGEN 0.2 08/12/2010 1451   NITRITE NEGATIVE 07/15/2024 1016   LEUKOCYTESUR NEGATIVE 07/15/2024 1016   Sepsis Labs: @LABRCNTIP (procalcitonin:4,lacticidven:4) )No results found for this or any previous visit (from the past  240 hours).   Radiological Exams on Admission:   Assessment/Plan Active Problems:   CAP (community acquired pneumonia)   Acute on chronic combined systolic and diastolic  CHF (congestive heart failure) (HCC)   Lung mass   Tachycardia   Hyponatremia   HTN (hypertension)   CAD (coronary artery disease)   HLD (hyperlipidemia)   Myocardial injury   Assessment and Plan:  CAP (community acquired pneumonia): Patient had fever of 100.1 at home.  Now has leukocytosis with WBC 21.1. CXR showed patchy bibasilar opacities, indicating possible pneumonia.  - Will admit to PCU as inpt - IV Rocephin  and azithromycin - Mucinex for cough  - Bronchodilators - Urine legionella and S. pneumococcal antigen - Follow up blood culture x2, sputum culture - Check RSEP panel  Acute on chronic combined systolic and diastolic CHF (congestive heart failure) (HCC): 2D echo on 05/19/2024 showed EF of 45-50% with grade 2 diastolic dysfunction.  Patient does not have leg edema, but has JVD, crackles on auscultation, elevated BNP 315 and vascular congestion on chest x-ray, indicating possible CHF exacerbation. Pt has hyponatremia with sodium 128, needs to monitor sodium level closely. -give one dose of Lasix 40 mg now --> f/u sodium level before giving more Lasix. -Daily weights -strict I/O's -Low salt diet -Fluid restriction -As needed bronchodilators for shortness of breath  Lung mass: CTA showed rounded right lung base mass concerning for malignancy , with osseous metastasis involving the posterior right seventh rib. -EDP consulted Dr. Jacobo of oncology  Tachycardia: Patient has sinus tachycardia, this seem to be a chronic issue, but is worse today. Hr 120-130s. - Continue home metoprolol , increased dose of from 12.5 mg to 25 mg twice daily - IV metoprolol  2.5 mg every 2 hour for heart rate > 125  Hyponatremia: Sodium 128, mental status normal.  SIADH is a potential differential diagnosis in the setting  for possible lung cancer. - Will check urine sodium, urine osmolality, serum osmolality. - Fluid restriction - IVF: 500 cc normal saline in the ED  - Sodium chloride  tablet 1 g twice daily - f/u by BMP q8h - avoid over correction too fast due to risk of central pontine myelinolysis - hold lisinopril   Hypertension: - Hold lisinopril  as above - IV hydralazine as needed - Metoprolol    CAD (coronary artery disease) and myocardial injury: Troponin 25 --> 28 --> 29.  Likely demand ischemia. -Trend troponin - Aspirin and Crestor   HLD (hyperlipidemia) -Crestor          DVT ppx: SQ Heparin      Code Status: Full code per pt  Family Communication:  Yes, patient's wife and 2 daughters at bed side.    Disposition Plan:  Anticipate discharge back to previous environment  Consults called:  Dr. Jacobo of oncology  Admission status and Level of care: Progressive: as inpt        Dispo: The patient is from: Home              Anticipated d/c is to: Home              Anticipated d/c date is: 2 days              Patient currently is not medically stable to d/c.    Severity of Illness:  The appropriate patient status for this patient is INPATIENT. Inpatient status is judged to be reasonable and necessary in order to provide the required intensity of service to ensure the patient's safety. The patient's presenting symptoms, physical exam findings, and initial radiographic and laboratory data in the context of their chronic comorbidities is felt to place them at high risk for further clinical deterioration.  Furthermore, it is not anticipated that the patient will be medically stable for discharge from the hospital within 2 midnights of admission.   * I certify that at the point of admission it is my clinical judgment that the patient will require inpatient hospital care spanning beyond 2 midnights from the point of admission due to high intensity of service, high risk for further  deterioration and high frequency of surveillance required.*       Date of Service 07/19/2024    Caleb Exon [facility]   If 7PM-7AM, please contact night-coverage [email or web address] 07/19/2024, 2:44 AM

## 2024-07-19 ENCOUNTER — Encounter: Payer: Self-pay | Admitting: *Deleted

## 2024-07-19 DIAGNOSIS — R918 Other nonspecific abnormal finding of lung field: Secondary | ICD-10-CM | POA: Diagnosis not present

## 2024-07-19 DIAGNOSIS — I10 Essential (primary) hypertension: Secondary | ICD-10-CM | POA: Diagnosis present

## 2024-07-19 DIAGNOSIS — D649 Anemia, unspecified: Secondary | ICD-10-CM

## 2024-07-19 DIAGNOSIS — J189 Pneumonia, unspecified organism: Secondary | ICD-10-CM | POA: Diagnosis not present

## 2024-07-19 LAB — BASIC METABOLIC PANEL WITH GFR
Anion gap: 11 (ref 5–15)
Anion gap: 10 (ref 5–15)
BUN: 16 mg/dL (ref 8–23)
BUN: 20 mg/dL (ref 8–23)
CO2: 23 mmol/L (ref 22–32)
CO2: 25 mmol/L (ref 22–32)
Calcium: 8.2 mg/dL — ABNORMAL LOW (ref 8.9–10.3)
Calcium: 8.4 mg/dL — ABNORMAL LOW (ref 8.9–10.3)
Chloride: 92 mmol/L — ABNORMAL LOW (ref 98–111)
Chloride: 94 mmol/L — ABNORMAL LOW (ref 98–111)
Creatinine, Ser: 0.56 mg/dL — ABNORMAL LOW (ref 0.61–1.24)
Creatinine, Ser: 0.64 mg/dL (ref 0.61–1.24)
GFR, Estimated: >60 mL/min (ref >60)
GFR, Estimated: >60 mL/min (ref >60)
Glucose, Bld: 109 mg/dL — ABNORMAL HIGH (ref 70–99)
Glucose, Bld: 113 mg/dL — ABNORMAL HIGH (ref 70–99)
Potassium: 3.6 mmol/L (ref 3.5–5.1)
Potassium: 4 mmol/L (ref 3.5–5.1)
Sodium: 126 mmol/L — ABNORMAL LOW (ref 135–145)
Sodium: 129 mmol/L — ABNORMAL LOW (ref 135–145)

## 2024-07-19 LAB — RESPIRATORY PANEL BY PCR

## 2024-07-19 LAB — LACTIC ACID, PLASMA
Lactic Acid, Venous: 1.4 mmol/L (ref 0.5–1.9)
Lactic Acid, Venous: 1.1 mmol/L (ref 0.5–1.9)

## 2024-07-19 LAB — PROTIME-INR
INR: 1.2 (ref 0.8–1.2)
Prothrombin Time: 15.8 s — ABNORMAL HIGH (ref 11.4–15.2)

## 2024-07-19 LAB — SODIUM, URINE, RANDOM: Sodium, Ur: 133 mmol/L

## 2024-07-19 LAB — CBC
HCT: 29.5 % — ABNORMAL LOW (ref 39.0–52.0)
Hemoglobin: 10.2 g/dL — ABNORMAL LOW (ref 13.0–17.0)
MCH: 29.4 pg (ref 26.0–34.0)
MCHC: 34.6 g/dL (ref 30.0–36.0)
MCV: 85 fL (ref 80.0–100.0)
Platelets: 149 K/uL — ABNORMAL LOW (ref 150–400)
RBC: 3.47 MIL/uL — ABNORMAL LOW (ref 4.22–5.81)
RDW: 16.9 % — ABNORMAL HIGH (ref 11.5–15.5)
WBC: 20.7 K/uL — ABNORMAL HIGH (ref 4.0–10.5)
nRBC: 0 % (ref 0.0–0.2)

## 2024-07-19 LAB — OSMOLALITY, URINE: Osmolality, Ur: 498 mosm/kg (ref 300–900)

## 2024-07-19 LAB — RESP PANEL BY RT-PCR (RSV, FLU A&B, COVID)  RVPGX2
Influenza A by PCR: NEGATIVE
Influenza B by PCR: NEGATIVE
Resp Syncytial Virus by PCR: NEGATIVE
SARS Coronavirus 2 by RT PCR: NEGATIVE

## 2024-07-19 LAB — TROPONIN I (HIGH SENSITIVITY)
Troponin I (High Sensitivity): 29 ng/L — ABNORMAL HIGH (ref <18)
Troponin I (High Sensitivity): 25 ng/L — ABNORMAL HIGH (ref <18)

## 2024-07-19 LAB — APTT: aPTT: 51 s — ABNORMAL HIGH (ref 24–36)

## 2024-07-19 LAB — STREP PNEUMONIAE URINARY ANTIGEN: Strep Pneumo Urinary Antigen: NEGATIVE

## 2024-07-19 MED ORDER — ENSURE PLUS HIGH PROTEIN PO LIQD
237.0000 mL | Freq: Two times a day (BID) | ORAL | Status: DC
  Administered 2024-07-19 – 2024-07-20 (×3): 237 mL via ORAL

## 2024-07-19 MED ORDER — MORPHINE SULFATE (PF) 2 MG/ML IV SOLN: 2.0000 mg | INTRAVENOUS | Status: DC | PRN

## 2024-07-19 MED ORDER — LIDOCAINE 5 % EX PTCH
1.0000 | MEDICATED_PATCH | CUTANEOUS | Status: DC
  Administered 2024-07-19 – 2024-07-20 (×2): 1 via TRANSDERMAL
  Filled 2024-07-19 (×2): qty 1

## 2024-07-19 MED ORDER — ORAL CARE MOUTH RINSE: 15.0000 mL | OROMUCOSAL | Status: DC | PRN

## 2024-07-19 MED ORDER — SODIUM CHLORIDE 0.9 % IV SOLN
1.0000 g | INTRAVENOUS | Status: DC
  Administered 2024-07-19: 1 g via INTRAVENOUS
  Filled 2024-07-19 (×2): qty 10

## 2024-07-19 MED ORDER — KETOROLAC TROMETHAMINE 15 MG/ML IJ SOLN
15.0000 mg | Freq: Once | INTRAMUSCULAR | Status: AC | PRN
  Administered 2024-07-19: 15 mg via INTRAVENOUS
  Filled 2024-07-19: qty 1

## 2024-07-19 MED ORDER — SODIUM CHLORIDE 0.9 % IV SOLN
500.0000 mg | INTRAVENOUS | Status: DC
  Administered 2024-07-19: 500 mg via INTRAVENOUS
  Filled 2024-07-19 (×2): qty 5

## 2024-07-19 NOTE — TOC CM/SW Note (Signed)
 Transition of Care [facility]) CM/SW Note    Transition of Care [facility]) - Inpatient Brief Assessment   Patient Details  Name: Barry Small MRN: [id number] Date of Birth: 10/03/1933  [facility] of [facility]) CM/SW Contact:    Alfonso Rummer, LCSW Phone Number: 07/19/2024, 11:01 AM   Clinical Narrative: KEN DELENA Rummer completed TOC chart review. No TOC needs identified please contact should needs arise    Transition of Care Asessment: Insurance and Status: Insurance coverage has been reviewed Patient has primary care physician: Yes MAYER, GRAHAM S) Home environment has been reviewed: single family home   Prior/Current Home Services: No current home services Social Drivers of Health Review: SDOH reviewed no interventions necessary Readmission risk has been reviewed: No Transition of care needs: no transition of care needs at this time

## 2024-07-19 NOTE — Progress Notes (Signed)
 PROGRESS NOTE Barry Small    DOB: [birth year], 89 y.o.  FMW:[id number]    Code Status: Full Code   DOA: 07/18/2024   LOS: 1  Brief hospital course  Barry Small is a 89 y.o. male with a PMH significant for recently found lung mass, sCHF with EF 45-505,  HTN, HLD, CAD, s/p of CABG, chronic tachycardia, bigeminy, tremor, Barrett's esophagus, bilateral proptosis, who presents with shortness of breath. ED Course: pt was found to have WBC 22.1, troponin 25, sodium 128, potassium 4.1, magnesium 1.8, BNP 315.4, temperature normal, blood pressure 145/84, heart rate of 120-130, RR 28, oxygen saturation 89% on room air, which improved to 92-99% on 2 L oxygen.  Patient is admitted to PCU as inpatient.  CTA of chest/abdomen/pelvis for dissection on 07/15/24 1. Rounded right lung base mass concerning for malignancy , with osseous metastasis involving the posterior right seventh rib. 2. Possible acute to subacute mild compression deformity of L1 vertebral body is noted. 3. Small hiatal hernia. 4. Status post cholecystectomy.   Chest x-ray: 1. Patchy bibasilar opacities, atelectasis versus pneumonia. 2. Cardiomegaly with pulmonary vascular congestion. 3. Calcified left pleural plaque.    Patient was admitted to medicine service for further workup and management of pneumonia as outlined in detail below.  07/19/24 -weaning from O2. Continue IV Abx until tomorrow if continues to improve.  Assessment & Plan  Active Problems:   CAP (community acquired pneumonia)   Acute on chronic combined systolic and diastolic CHF (congestive heart failure) (HCC)   Lung mass   Tachycardia   Hyponatremia   HTN (hypertension)   CAD (coronary artery disease)   HLD (hyperlipidemia)   Myocardial injury  CAP: Patient had fever of 100.1 at home.  Now has leukocytosis with WBC 21.1. CXR showed patchy bibasilar opacities, indicating possible pneumonia. Febrile today - wean from O2.  - ambulate with pulse ox -  IV Rocephin  and azithromycin - Mucinex for cough  - RVP is STILL in process. If positive, can consider stopping antibiotics.    Acute on chronic HFrEF: 2D echo on 05/19/2024 showed EF of 45-50% with grade 2 diastolic dysfunction. Appears euvolemic.    Lung mass: CTA showed rounded right lung base mass concerning for malignancy , with osseous metastasis involving the posterior right seventh rib. -Dr. Jacobo of oncology consulted and arranging outpatient biopsy and PET scan. Will need to hold aspirin until that time.   L1 vertebral body compression fx seen on CTA. Consistent with his pain on exam - continue lidocaine patches. Counseled him to not return to chiropractor    Tachycardia: improved.  - Continue home metoprolol , increased dose of from 12.5 mg to 25 mg twice daily - IV metoprolol  2.5 mg every 2 hour for heart rate > 125   Hyponatremia: Sodium 128>>>129, mental status normal.  SIADH is a potential differential diagnosis in the setting for possible lung cancer. - Will check urine sodium, urine osmolality, serum osmolality. - Fluid restriction - IVF: 500 cc normal saline in the ED  - Sodium chloride  tablet 1 g twice daily   Hypertension: - Hold lisinopril  as above - Metoprolol     CAD (coronary artery disease) and myocardial injury: Troponin 25 --> 28 --> 29.  Likely demand ischemia. -Trend troponin - Aspirin and Crestor    HLD (hyperlipidemia) -Crestor    Body mass index is 26.29 kg/m.  VTE ppx: heparin injection 5,000 Units Start: 07/18/24 2200  Diet:     Diet   Diet regular Fluid consistency:  Thin; Fluid restriction: 1200 mL Fluid   Consultants: Oncology   Subjective 07/19/24    Pt reports feeling well other than back pain. Wants to get up and walk more. Slept well.    Objective  Blood pressure (!) 127/90, pulse 88, temperature 98.4 F (36.9 C), resp. rate 20, height 5' 5 (1.651 m), weight 71.7 kg, SpO2 98%.  Intake/Output Summary (Last 24 hours) at  07/19/2024 0744 Last data filed at 07/19/2024 0500 Gross per 24 hour  Intake 240 ml  Output --  Net 240 ml   Filed Weights   07/18/24 1659  Weight: 71.7 kg    Physical Exam:  General: awake, alert, NAD HEENT: atraumatic, clear conjunctiva, anicteric sclera, MMM, hard of hearing Respiratory: normal respiratory effort. No accessory muscle use. Adventitious sounds bilaterally Cardiovascular: extremities well perfused, quick capillary refill, normal S1/S2, RRR, no JVD, murmurs Gastrointestinal: soft, NT, ND Nervous: A&O x3. no gross focal neurologic deficits, normal speech Extremities: moves all equally, no edema, normal tone Skin: dry, intact, normal temperature, normal color. No rashes, lesions or ulcers on exposed skin Psychiatry: normal mood, congruent affect  Labs   I have personally reviewed the following labs and imaging studies CBC    Component Value Date/Time   WBC 20.7 (H) 07/19/2024 0552   RBC 3.47 (L) 07/19/2024 0552   HGB 10.2 (L) 07/19/2024 0552   HCT 29.5 (L) 07/19/2024 0552   PLT 149 (L) 07/19/2024 0552   MCV 85.0 07/19/2024 0552   MCH 29.4 07/19/2024 0552   MCHC 34.6 07/19/2024 0552   RDW 16.9 (H) 07/19/2024 0552   LYMPHSABS 1.6 07/18/2024 1710   MONOABS 2.2 (H) 07/18/2024 1710   EOSABS 0.1 07/18/2024 1710   BASOSABS 0.1 07/18/2024 1710      Latest Ref Rng & Units 07/19/2024    5:52 AM 07/18/2024   10:29 PM 07/18/2024    5:10 PM  BMP  Glucose 70 - 99 mg/dL 890  832  94   BUN 8 - 23 mg/dL 16  15  16    Creatinine 0.61 - 1.24 mg/dL 9.43  9.41  9.29   Sodium 135 - 145 mmol/L 126  128  128   Potassium 3.5 - 5.1 mmol/L 3.6  3.5  4.1   Chloride 98 - 111 mmol/L 92  96  93   CO2 22 - 32 mmol/L 23  22  23    Calcium  8.9 - 10.3 mg/dL 8.2  7.7  8.4     DG Chest Portable 1 View Result Date: 07/18/2024 EXAM: 1 AP VIEW XRAY OF THE CHEST 07/18/2024 05:42:00 PM COMPARISON: None available. CLINICAL HISTORY: SOB. FINDINGS: LUNGS AND PLEURA: Hypoinflation. Patchy  opacities in lung bases, atelectasis versus pneumonia. Calcified left pleural plaque. Pulmonary vascular congestion. No pulmonary edema. No pleural effusion. No pneumothorax. HEART AND MEDIASTINUM: Cardiomegaly. Aortic arch atherosclerosis. CABG markers noted. BONES AND SOFT TISSUES: Sternotomy wires noted. No acute osseous abnormality. IMPRESSION: 1. Patchy bibasilar opacities, atelectasis versus pneumonia. 2. Cardiomegaly with pulmonary vascular congestion. 3. Calcified left pleural plaque. Electronically signed by: Norman Gatlin MD 07/18/2024 06:21 PM EST RP Workstation: [id number]   Disposition Plan & Communication  Patient status: Inpatient  Admitted From: Home Planned disposition location: Home Anticipated discharge date: 11/12 pending O2 weaning  Family Communication: wife and daughter at bedside     Author: Marien LITTIE Piety, DO [facility] 07/19/2024, 7:44 AM   Available by Epic secure chat 7AM-7PM. If 7PM-7AM, please contact night-coverage.  [facility] contact information found on [email or web address].

## 2024-07-19 NOTE — Consult Note (Signed)
 [facility]  [phone] [phone]  ID: Barry Small OB: 03/19/1934  MR#: [id number]  RDW#:[id number]  Patient Care Team: [name], MD as PCP - Diedre Kennyth Chew, MD as PCP - Electrophysiology (Cardiology) Perla Evalene PARAS, MD as Consulting Physician (Cardiology) Fate Morna SAILOR, [facility] (Inactive) as Pharmacist (Pharmacist) [place], [facility], [place], [place] as Oncology Nurse Navigator  CHIEF COMPLAINT: Right lower lobe lung mass.  INTERVAL HISTORY: Patient is an 89 year old male who recently presented to the emergency room with worsening shortness of breath over the last several weeks.  He also was noted to have cough, fever, and leukocytosis.  Subsequent CT scan revealed a right lower lobe lung mass concerning for malignancy.  He currently feels well and is nearly back to his baseline.  He has no neurologic complaints.  He has a good appetite and denies weight loss.  He has right flank pain.  He has no chest pain or hemoptysis.  He denies any nausea, vomiting, constipation, or diarrhea.  He has no urinary complaints.  Patient offers no further specific complaints today.  REVIEW OF SYSTEMS:   Review of Systems  Constitutional: Negative.  Negative for fever, malaise/fatigue and weight loss.  Respiratory:  Positive for cough and shortness of breath. Negative for hemoptysis.   Cardiovascular: Negative.  Negative for chest pain and leg swelling.  Gastrointestinal: Negative.  Negative for abdominal pain.  Genitourinary:  Positive for flank pain.  Musculoskeletal:  Negative for back pain.  Skin: Negative.  Negative for rash.  Neurological: Negative.  Negative for dizziness, focal weakness, weakness and headaches.  Psychiatric/Behavioral: Negative.  The patient is not nervous/anxious.     As per HPI. Otherwise, a complete review of systems is negative.  PAST MEDICAL HISTORY: Past Medical History:  Diagnosis Date    Barrett's esophagus    Colon polyps 04/2003   type unknown    Coronary artery disease    Elevated bilirubin    Elevated glucose    GERD (gastroesophageal reflux disease)    Hyperlipidemia    Hypertension    Myocardial infarction [facility])    Pneumonia 2014    PAST SURGICAL HISTORY: Past Surgical History:  Procedure Laterality Date   BELPHAROPTOSIS REPAIR  08/2018   Cardiolite  12/2001   h/o vessel infarct EF 66%   CATARACT EXTRACTION W/ INTRAOCULAR LENS  IMPLANT, BILATERAL  09/2018   CHOLECYSTECTOMY  09/2001   COLONOSCOPY  11/15/2008   Mild divertics, 5 yrs, Dr. Aneita   COLONOSCOPY W/ POLYPECTOMY  05/09/2003   x multiple, Dr. Viktoria   CORONARY ANGIOPLASTY  2001   x 2, Dr.Pulsipher   CORONARY ANGIOPLASTY WITH STENT PLACEMENT  12/05/2005   CORONARY ARTERY BYPASS GRAFT  1989   [facility]   ESOPHAGOGASTRODUODENOSCOPY  05/09/2003   Barrett's   ESOPHAGOGASTRODUODENOSCOPY  11/17/08   Barrett's Esoph [facility] (Dr. Aneita)  2 years   EYE SURGERY  09/2018   cataract extraction   [facility]  03/28-03/31/2007   Chest pain   NASAL SINUS SURGERY  06/23/2008   Dr.Clark   NOSE SURGERY  06/23/2008   Dr. Gretta   Stress Myoview   09/30/2004   Normal EF 70%   Stress Myoview   02/11/2006   No change, c/w 09/2004   Stress Myoview   02/22/2008   Abnormal w/prior inf infarct  No signif ischemia    FAMILY HISTORY: Family History  Problem Relation Age of Onset   Heart disease Father    Pneumonia Mother  Colon cancer Neg Hx    Prostate cancer Neg Hx     ADVANCED DIRECTIVES (Y/N):  @ADVDIR @  HEALTH MAINTENANCE: Social History   Tobacco Use   Smoking status: Never   Smokeless tobacco: Never  Vaping Use   Vaping status: Never Used  Substance Use Topics   Alcohol use: Yes    Comment: occassional beer-socially   Drug use: No     Colonoscopy:  PAP:  Bone density:  Lipid panel:  Allergies  Allergen Reactions   Atorvastatin Other (See Comments)    REACTION: myalgia   Morphine Other  (See Comments)    REACTION: syncope- lower BP but may be able to tolerate low dose of med.    Simvastatin Other (See Comments)    REACTION: ? reaction    Current Facility-Administered Medications  Medication Dose Route Frequency Provider Last Rate Last Admin   acetaminophen (TYLENOL) tablet 650 mg  650 mg Oral Q6H PRN Niu, Xilin, MD       albuterol (PROVENTIL) (2.5 MG/3ML) 0.083% nebulizer solution 2.5 mg  2.5 mg Nebulization Q4H PRN Niu, Xilin, MD       aspirin EC tablet 81 mg  81 mg Oral Daily Niu, Xilin, MD   81 mg at 07/19/24 0915   azithromycin (ZITHROMAX) 500 mg in sodium chloride  0.9 % 250 mL IVPB  500 mg Intravenous Q24H Niu, Xilin, MD       cefTRIAXone  (ROCEPHIN ) 1 g in sodium chloride  0.9 % 100 mL IVPB  1 g Intravenous Q24H Niu, Xilin, MD       cholecalciferol (VITAMIN D3) 25 MCG (1000 UNIT) tablet 2,000 Units  2,000 Units Oral Daily Niu, Xilin, MD   2,000 Units at 07/19/24 0914   dextromethorphan-guaiFENesin (MUCINEX DM) 30-600 MG per 12 hr tablet 1 tablet  1 tablet Oral BID PRN Niu, Xilin, MD       feeding supplement (ENSURE PLUS HIGH PROTEIN) liquid 237 mL  237 mL Oral BID BM Niu, Xilin, MD   237 mL at 07/19/24 1300   heparin injection 5,000 Units  5,000 Units Subcutaneous Q8H Niu, Xilin, MD   5,000 Units at 07/19/24 1300   hydrALAZINE (APRESOLINE) injection 5 mg  5 mg Intravenous Q2H PRN Niu, Xilin, MD       lidocaine (LIDODERM) 5 % 1 patch  1 patch Transdermal Q24H Lenon Marien CROME, MD   1 patch at 07/19/24 1251   metoprolol  tartrate (LOPRESSOR ) injection 2.5 mg  2.5 mg Intravenous Q2H PRN Niu, Xilin, MD       metoprolol  tartrate (LOPRESSOR ) tablet 25 mg  25 mg Oral BID Niu, Xilin, MD   25 mg at 07/19/24 9085   multivitamin with minerals tablet 1 tablet  1 tablet Oral Daily Niu, Xilin, MD   1 tablet at 07/19/24 9085   nitroGLYCERIN  (NITROSTAT ) SL tablet 0.4 mg  0.4 mg Sublingual Q5 min PRN Niu, Xilin, MD       ondansetron  (ZOFRAN ) injection 4 mg  4 mg Intravenous Q8H PRN Niu,  Xilin, MD       oxyCODONE (Oxy IR/ROXICODONE) immediate release tablet 5 mg  5 mg Oral Q8H PRN Niu, Xilin, MD   5 mg at 07/18/24 2220   pantoprazole (PROTONIX) EC tablet 40 mg  40 mg Oral Daily Niu, Xilin, MD   40 mg at 07/19/24 0915   rosuvastatin  (CRESTOR ) tablet 20 mg  20 mg Oral Daily Niu, Xilin, MD   20 mg at 07/19/24 0914   sodium chloride  tablet 1  g  1 g Oral BID WC Niu, Xilin, MD   1 g at 07/19/24 0914    OBJECTIVE: Vitals:   07/19/24 0927 07/19/24 1152  BP: 104/72 (!) 108/57  Pulse: 98 69  Resp: 18 18  Temp: 98 F (36.7 C) 98.3 F (36.8 C)  SpO2: 98% 94%     Body mass index is 26.29 kg/m.    ECOG FS:1 - Symptomatic but completely ambulatory  General: Well-developed, well-nourished, no acute distress. Eyes: Pink conjunctiva, anicteric sclera. HEENT: Normocephalic, moist mucous membranes. Lungs: No audible wheezing or coughing. Heart: Regular rate and rhythm. Abdomen: Soft, nontender, no obvious distention. Musculoskeletal: No edema, cyanosis, or clubbing. Neuro: Alert, answering all questions appropriately. Cranial nerves grossly intact. Skin: No rashes or petechiae noted. Psych: Normal affect. Lymphatics: No cervical, calvicular, axillary or inguinal LAD.   LAB RESULTS:  Lab Results  Component Value Date   NA 126 (L) 07/19/2024   K 3.6 07/19/2024   CL 92 (L) 07/19/2024   CO2 23 07/19/2024   GLUCOSE 109 (H) 07/19/2024   BUN 16 07/19/2024   CREATININE 0.56 (L) 07/19/2024   CALCIUM  8.2 (L) 07/19/2024   PROT 7.0 07/18/2024   ALBUMIN 3.0 (L) 07/18/2024   AST 43 (H) 07/18/2024   ALT 23 07/18/2024   ALKPHOS 160 (H) 07/18/2024   BILITOT 1.1 07/18/2024   GFRNONAA >60 07/19/2024   GFRAA 143 08/30/2008    Lab Results  Component Value Date   WBC 20.7 (H) 07/19/2024   NEUTROABS 17.7 (H) 07/18/2024   HGB 10.2 (L) 07/19/2024   HCT 29.5 (L) 07/19/2024   MCV 85.0 07/19/2024   PLT 149 (L) 07/19/2024     STUDIES: DG Chest Portable 1 View Result Date:  07/18/2024 EXAM: 1 AP VIEW XRAY OF THE CHEST 07/18/2024 05:42:00 PM COMPARISON: None available. CLINICAL HISTORY: SOB. FINDINGS: LUNGS AND PLEURA: Hypoinflation. Patchy opacities in lung bases, atelectasis versus pneumonia. Calcified left pleural plaque. Pulmonary vascular congestion. No pulmonary edema. No pleural effusion. No pneumothorax. HEART AND MEDIASTINUM: Cardiomegaly. Aortic arch atherosclerosis. CABG markers noted. BONES AND SOFT TISSUES: Sternotomy wires noted. No acute osseous abnormality. IMPRESSION: 1. Patchy bibasilar opacities, atelectasis versus pneumonia. 2. Cardiomegaly with pulmonary vascular congestion. 3. Calcified left pleural plaque. Electronically signed by: Norman Gatlin MD 07/18/2024 06:21 PM EST RP Workstation: [id number]   CT Angio Chest/Abd/Pel for Dissection W and/or Wo Contrast Result Date: 07/15/2024 EXAM: CTA CHEST, ABDOMEN AND PELVIS WITH AND WITHOUT CONTRAST 07/15/2024 10:32:48 AM TECHNIQUE: CTA of the chest was performed with and without the administration of intravenous contrast. CTA of the abdomen and pelvis was performed with and without the administration of intravenous contrast. Multiplanar reformatted images are provided for review. MIP images are provided for review. Automated exposure control, iterative reconstruction, and/or weight based adjustment of the mA/kV was utilized to reduce the radiation dose to as low as reasonably achievable. 100 mL of iohexol  (OMNIPAQUE ) 350 MG/ML injection was administered. COMPARISON: 10/26/2020 status post coronary artery bypass graft with dissection is noted. CLINICAL HISTORY: acute back pain, hypertension, leukocytosis 21k FINDINGS: VASCULATURE: AORTA: No acute finding. No abdominal aortic aneurysm. No dissection. PULMONARY ARTERIES: No pulmonary embolism with the limits of this exam. GREAT VESSELS OF AORTIC ARCH: No acute finding. No dissection. No arterial occlusion or significant stenosis. CELIAC TRUNK: No acute finding. No  occlusion or significant stenosis. SUPERIOR MESENTERIC ARTERY: Severe narrowing at origin of superior mesenteric artery secondary to calcified plaque. INFERIOR MESENTERIC ARTERY: No acute finding. No occlusion or significant stenosis. RENAL  ARTERIES: No acute finding. No occlusion or significant stenosis. ILIAC ARTERIES: No acute finding. No occlusion or significant stenosis. CHEST: MEDIASTINUM: No mediastinal lymphadenopathy. The heart and pericardium demonstrate no acute abnormality. LUNGS AND PLEURA: 5.1 x 3.0 cm rounded mass is noted laterally in the right lung base concerning for malignancy. Calcified pleural plaques are noted bilaterally. Minimal bibasilar subsegmental atelectasis is noted. No focal consolidation or pulmonary edema. No evidence of pleural effusion or pneumothorax. THORACIC BONES AND SOFT TISSUES: Malignant destruction of posterior portion of right seventh rib is noted consistent with metastatic disease. Small hiatal hernia. ABDOMEN AND PELVIS: LIVER: The liver is unremarkable. GALLBLADDER AND BILE DUCTS: Status post cholecystectomy. No biliary ductal dilatation. SPLEEN: The spleen is unremarkable. PANCREAS: The pancreas is unremarkable. ADRENAL GLANDS: Bilateral adrenal glands demonstrate no acute abnormality. KIDNEYS, URETERS AND BLADDER: Mild urinary bladder distention. No stones in the kidneys or ureters. No hydronephrosis. No perinephric or periureteral stranding. GI AND BOWEL: Stomach and duodenal sweep demonstrate no acute abnormality. There is no bowel obstruction. No abnormal bowel wall thickening or distension. REPRODUCTIVE: Reproductive organs are unremarkable. PERITONEUM AND RETROPERITONEUM: No ascites or free air. LYMPH NODES: No lymphadenopathy. ABDOMINAL BONES AND SOFT TISSUES: Possible acute to subacute mild compression deformity of L1 vertebral body is noted. No acute soft tissue abnormality. IMPRESSION: 1. Rounded right lung base mass concerning for malignancy , with osseous  metastasis involving the posterior right seventh rib. 2. Possible acute to subacute mild compression deformity of L1 vertebral body is noted. 3. Small hiatal hernia. 4. Status post cholecystectomy. Electronically signed by: Lynwood Seip MD 07/15/2024 11:09 AM EST RP Workstation: [id number]    ASSESSMENT: Right lower lobe lung mass.  PLAN:    Right lower lobe lung mass: Suspicious for underlying malignancy.  CT scan of the chest from July 15, 2024 reviewed independently and reported as above with a 5.1 x 3.0 right lung base mass possibly involving posterior right seventh rib.  There was no reported suspicious lymphadenopathy.  Patient will require CT-guided biopsy as well as PET scan for diagnosis and staging which can be completed as an outpatient after discharge.  Will arrange follow-up in the cancer center after his diagnostic testing to discuss the results and treatment planning if necessary. Leukocytosis: Possibly reactive.  Patient being treated for community-acquired pneumonia. Anemia: Patient's hemoglobin is trended down to 10.2, monitor. Pain: Patient states Tylenol is adequate for now. Cough/shortness of breath: Possible CAP.  Antibiotics as per hospitalist.  Appreciate consult, will follow.  Evalene JINNY Reusing, MD   07/19/2024 1:37 PM

## 2024-07-19 NOTE — Progress Notes (Signed)
 Per Dr. Jacobo, pt currently admitted and will require further outpatient workup at discharge with PET scan and CT guided lung mass biopsy. Orders placed to start insurance authorization. Will schedule and notify pt once discharged. Clearance letter to hold aspirin 5 days prior to biopsy faxed to cardiology.

## 2024-07-19 NOTE — Plan of Care (Signed)

## 2024-07-20 ENCOUNTER — Ambulatory Visit: Payer: Self-pay | Admitting: Family Medicine

## 2024-07-20 ENCOUNTER — Other Ambulatory Visit: Payer: Self-pay

## 2024-07-20 DIAGNOSIS — I2585 Chronic coronary microvascular dysfunction: Secondary | ICD-10-CM | POA: Diagnosis not present

## 2024-07-20 DIAGNOSIS — E871 Hypo-osmolality and hyponatremia: Secondary | ICD-10-CM | POA: Diagnosis not present

## 2024-07-20 DIAGNOSIS — J189 Pneumonia, unspecified organism: Secondary | ICD-10-CM | POA: Diagnosis not present

## 2024-07-20 DIAGNOSIS — R918 Other nonspecific abnormal finding of lung field: Secondary | ICD-10-CM | POA: Diagnosis not present

## 2024-07-20 LAB — BASIC METABOLIC PANEL WITH GFR
Anion gap: 11 (ref 5–15)
BUN: 17 mg/dL (ref 8–23)
CO2: 22 mmol/L (ref 22–32)
Calcium: 8.1 mg/dL — ABNORMAL LOW (ref 8.9–10.3)
Chloride: 94 mmol/L — ABNORMAL LOW (ref 98–111)
Creatinine, Ser: 0.5 mg/dL — ABNORMAL LOW (ref 0.61–1.24)
GFR, Estimated: >60 mL/min (ref >60)
Glucose, Bld: 88 mg/dL (ref 70–99)
Potassium: 3.9 mmol/L (ref 3.5–5.1)
Sodium: 127 mmol/L — ABNORMAL LOW (ref 135–145)

## 2024-07-20 LAB — LEGIONELLA PNEUMOPHILA SEROGP 1 UR AG: L. pneumophila Serogp 1 Ur Ag: NEGATIVE

## 2024-07-20 MED ORDER — SODIUM CHLORIDE 1 G PO TABS
1.0000 g | ORAL_TABLET | Freq: Two times a day (BID) | ORAL | 0 refills | Status: DC
  Filled 2024-07-20: qty 28, 14d supply, fill #0

## 2024-07-20 MED ORDER — METOPROLOL TARTRATE 25 MG PO TABS
25.0000 mg | ORAL_TABLET | Freq: Two times a day (BID) | ORAL | 30 refills | Status: DC
  Filled 2024-07-20: qty 30, 15d supply, fill #0

## 2024-07-20 MED ORDER — OXYCODONE HCL 5 MG PO TABS
5.0000 mg | ORAL_TABLET | Freq: Three times a day (TID) | ORAL | 0 refills | Status: AC | PRN
  Filled 2024-07-20: qty 20, 7d supply, fill #0

## 2024-07-20 MED ORDER — AMOXICILLIN-POT CLAVULANATE 875-125 MG PO TABS
1.0000 | ORAL_TABLET | Freq: Two times a day (BID) | ORAL | 0 refills | Status: AC
  Filled 2024-07-20: qty 4, 2d supply, fill #0

## 2024-07-20 NOTE — Care Management Important Message (Signed)
 Important Message  Patient Details  Name: Barry Small MRN: [id number] Date of Birth: August 12, 1934   Important Message Given:  Yes - Medicare IM     Rojelio SHAUNNA Rattler 07/20/2024, 4:46 PM

## 2024-07-20 NOTE — Progress Notes (Signed)
 Nsg Discharge Note  Admit Date:  07/18/2024 Discharge date: 07/20/2024   KARTEL WOLBERT to be D/C'd Home per MD order.  AVS completed.  Copy for chart, and copy for patient signed, and dated. Patient/caregiver able to verbalize understanding.  IV catheter discontinued intact. Site without signs and symptoms of complications - no redness or edema noted at insertion site, patient denies c/o pain - only slight tenderness at site.  Dressing with slight pressure applied.  D/c Instructions-Education: Discharge instructions given to patient/family with verbalized understanding. D/c education completed with patient/family including follow up instructions, medication list, d/c activities limitations if indicated, with other d/c instructions as indicated by MD - patient able to verbalize understanding, all questions fully answered. Patient instructed to return to ED, call 911, or call MD for any changes in condition.  Patient escorted via WC, and D/C home via private auto.

## 2024-07-20 NOTE — Progress Notes (Signed)
 Waiting on meds to bed at this time. Pt's ride is on the way. Have notified pharmacy re: meds.

## 2024-07-20 NOTE — Plan of Care (Signed)
  Problem: Education: Goal: Knowledge of General Education information will improve Description: Including pain rating scale, medication(s)/side effects and non-pharmacologic comfort measures Outcome: Progressing   Problem: Clinical Measurements: Goal: Cardiovascular complication will be avoided Outcome: Progressing   Problem: Activity: Goal: Risk for activity intolerance will decrease Outcome: Progressing   Problem: Elimination: Goal: Will not experience complications related to urinary retention Outcome: Progressing   Problem: Safety: Goal: Ability to remain free from injury will improve Outcome: Progressing

## 2024-07-20 NOTE — Discharge Summary (Signed)
 DISCHARGE SUMMARY    Barry Small:[id number] DOB: [birth year] DOA: 07/18/2024  PCP: [name], MD  Admit date: 07/18/2024 Discharge date: 07/20/2024   Recommendations for Outpatient Follow-up:  Follow up with PCP in 1-2  weeks to recheck sodium levels and blood pressure. Follow-up with oncology for biopsy and skin  Hospital Course: Barry Small with recently found lung mass with metastatic spread to right rib, systolic CHF with EF 55 to 50%, hypertension, hyperlipidemia, CAD status post CABG, chronic tachycardia with bigeminy, chronic tremor, Barrett's esophagus, bilateral proptosis, who presents with dyspnea.  He was found to have leukocytosis of 22, hyponatremia 128, tachycardia to 120, O2 sat of 89% on room air.  He was admitted for workup.  CXR concerning for patchy bibasilar opacities.  He was started on treatment for community-acquired pneumonia.  Respiratory viral panel resulted positive for rhinovirus.  Patient completed 3 days of azithromycin and ceftriaxone  therapy.  By evaluation on 11/12 he reported feeling back to his baseline.  He was comfortable room air with sat of 95%. He has scheduled outpatient follow-up with oncology for CT-guided biopsy and PET scan to better evaluate lung mass and oncology therapy planning. Meds to beds delivered medications prior to discharge  Community-acquired pneumonia Rhinovirus - Leukocytosis complicated by malignancy.  CXR with opacities. - No further fever - Has now weaned from O2 - Status post 3 days Rocephin  and azithromycin.  Complete course with an additional 2 days of Augmentin at home - Mucinex as needed for cough  Acute on chronic heart failure with reduced EF - EF 45 to 50%, grade 2 diastolic dysfunction - Clinically euvolemic on exam  Lung mass - CTA shows rounded right lung base mass concerning for malignancy with osseous metastasis involving the posterior right seventh rib - Patient has a career as a  it sales professional - Dr. Jacobo of oncology has been consulted.  CT-guided outpatient biopsy and PET scan has been arranged.  Hold aspirin until that time.  Patient has been counseled - Pain control with Tylenol and Oxy.  PDMP reviewed.  L1 vertebral body compression fracture - Seen on CTA - Consistent with pain on exam - Continue lidocaine patches, scheduled Tylenol, as needed oxy - Have counseled extensively on not returning to the chiropractor whom was apparently treating the patient's back and rib pain.  Tachycardia - Increased home dose metoprolol  at DC  Hyponatremia - Sodium 128 on arrival, improved mildly, back to 127 now. - Fluid restriction - Continue with salt tabs for 14 days. 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 - Urine sodium: 133 urine osmo: 498: Serum Osmo: 269, low  Discharge Instructions  Discharge Instructions     Call MD for:  difficulty breathing, headache or visual disturbances   Complete by: As directed    Call MD for:  persistant dizziness or light-headedness   Complete by: As directed    Call MD for:  persistant nausea and vomiting   Complete by: As directed    Call MD for:  severe uncontrolled pain   Complete by: As directed    Call MD for:  temperature >100.4   Complete by: As directed    Diet general   Complete by: As directed    Discharge instructions   Complete by: As directed    While admitted we made some changes to your blood pressure medications. Please review your discharge medications closely to ensure you are taking the  correct meds at home. Please take your blood pressure once a day and keep a log. See your primary care doctor in one week to review this log and make further medication changes.  Follow-up with oncology as scheduled for your CT-guided biopsy and PET scan.  Please take Tylenol every 6 hours.  You may take 500 mg at a time, if needed you may take 1000 mg at  bedtime.  Do not exceed 4000 mg in a day.  I have called in oxycodone as well to take as needed for your pain.   Increase activity slowly   Complete by: As directed    No wound care   Complete by: As directed       Allergies as of 07/20/2024       Reactions   Atorvastatin Other (See Comments)   REACTION: myalgia   Morphine Other (See Comments)   REACTION: syncope- lower BP but may be able to tolerate low dose of med.    Simvastatin Other (See Comments)   REACTION: ? reaction        Medication List     STOP taking these medications    doxycycline 100 MG tablet Commonly known as: VIBRA-TABS   lisinopril  20 MG tablet Commonly known as: ZESTRIL        TAKE these medications    amoxicillin-clavulanate 875-125 MG tablet Commonly known as: AUGMENTIN Take 1 tablet by mouth 2 (two) times daily for 2 days.   aspirin EC 81 MG tablet Take 81 mg by mouth daily.   metoprolol  tartrate 25 MG tablet Commonly known as: LOPRESSOR  Take 1 tablet (25 mg total) by mouth 2 (two) times daily. What changed:  how much to take how to take this when to take this additional instructions   multivitamin tablet Take 1 tablet by mouth daily.   nitroGLYCERIN  0.4 MG SL tablet Commonly known as: NITROSTAT  DISSOLVE ONE TABLET UNDER TONGUE EVERY 5 MINUTES AS NEEDED FOR CHEST PAIN   omeprazole  20 MG capsule Commonly known as: PRILOSEC Take 1 capsule (20 mg total) by mouth every Monday, Wednesday, and Friday.   oxyCODONE 5 MG immediate release tablet Commonly known as: Roxicodone Take 1 tablet (5 mg total) by mouth every 8 (eight) hours as needed for up to 14 days.   rosuvastatin  20 MG tablet Commonly known as: CRESTOR  Take 1 tablet (20 mg total) by mouth daily.   sodium chloride  1 g tablet Take 1 tablet (1 g total) by mouth 2 (two) times daily with a meal for 14 days.   Vitamin D3 50 MCG (2000 UT) Tabs Take by mouth daily.        Allergies  Allergen Reactions   Atorvastatin  Other (See Comments)    REACTION: myalgia   Morphine Other (See Comments)    REACTION: syncope- lower BP but may be able to tolerate low dose of med.    Simvastatin Other (See Comments)    REACTION: ? reaction    Consultations:    Procedures/Studies: DG Chest Portable 1 View Result Date: 07/18/2024 EXAM: 1 AP VIEW XRAY OF THE CHEST 07/18/2024 05:42:00 PM COMPARISON: None available. CLINICAL HISTORY: SOB. FINDINGS: LUNGS AND PLEURA: Hypoinflation. Patchy opacities in lung bases, atelectasis versus pneumonia. Calcified left pleural plaque. Pulmonary vascular congestion. No pulmonary edema. No pleural effusion. No pneumothorax. HEART AND MEDIASTINUM: Cardiomegaly. Aortic arch atherosclerosis. CABG markers noted. BONES AND SOFT TISSUES: Sternotomy wires noted. No acute osseous abnormality. IMPRESSION: 1. Patchy bibasilar opacities, atelectasis versus pneumonia. 2. Cardiomegaly with  pulmonary vascular congestion. 3. Calcified left pleural plaque. Electronically signed by: Norman Gatlin MD 07/18/2024 06:21 PM EST RP Workstation: [id number]   CT Angio Chest/Abd/Pel for Dissection W and/or Wo Contrast Result Date: 07/15/2024 EXAM: CTA CHEST, ABDOMEN AND PELVIS WITH AND WITHOUT CONTRAST 07/15/2024 10:32:48 AM TECHNIQUE: CTA of the chest was performed with and without the administration of intravenous contrast. CTA of the abdomen and pelvis was performed with and without the administration of intravenous contrast. Multiplanar reformatted images are provided for review. MIP images are provided for review. Automated exposure control, iterative reconstruction, and/or weight based adjustment of the mA/kV was utilized to reduce the radiation dose to as low as reasonably achievable. 100 mL of iohexol  (OMNIPAQUE ) 350 MG/ML injection was administered. COMPARISON: 10/26/2020 status post coronary artery bypass graft with dissection is noted. CLINICAL HISTORY: acute back pain, hypertension, leukocytosis 21k FINDINGS:  VASCULATURE: AORTA: No acute finding. No abdominal aortic aneurysm. No dissection. PULMONARY ARTERIES: No pulmonary embolism with the limits of this exam. GREAT VESSELS OF AORTIC ARCH: No acute finding. No dissection. No arterial occlusion or significant stenosis. CELIAC TRUNK: No acute finding. No occlusion or significant stenosis. SUPERIOR MESENTERIC ARTERY: Severe narrowing at origin of superior mesenteric artery secondary to calcified plaque. INFERIOR MESENTERIC ARTERY: No acute finding. No occlusion or significant stenosis. RENAL ARTERIES: No acute finding. No occlusion or significant stenosis. ILIAC ARTERIES: No acute finding. No occlusion or significant stenosis. CHEST: MEDIASTINUM: No mediastinal lymphadenopathy. The heart and pericardium demonstrate no acute abnormality. LUNGS AND PLEURA: 5.1 x 3.0 cm rounded mass is noted laterally in the right lung base concerning for malignancy. Calcified pleural plaques are noted bilaterally. Minimal bibasilar subsegmental atelectasis is noted. No focal consolidation or pulmonary edema. No evidence of pleural effusion or pneumothorax. THORACIC BONES AND SOFT TISSUES: Malignant destruction of posterior portion of right seventh rib is noted consistent with metastatic disease. Small hiatal hernia. ABDOMEN AND PELVIS: LIVER: The liver is unremarkable. GALLBLADDER AND BILE DUCTS: Status post cholecystectomy. No biliary ductal dilatation. SPLEEN: The spleen is unremarkable. PANCREAS: The pancreas is unremarkable. ADRENAL GLANDS: Bilateral adrenal glands demonstrate no acute abnormality. KIDNEYS, URETERS AND BLADDER: Mild urinary bladder distention. No stones in the kidneys or ureters. No hydronephrosis. No perinephric or periureteral stranding. GI AND BOWEL: Stomach and duodenal sweep demonstrate no acute abnormality. There is no bowel obstruction. No abnormal bowel wall thickening or distension. REPRODUCTIVE: Reproductive organs are unremarkable. PERITONEUM AND  RETROPERITONEUM: No ascites or free air. LYMPH NODES: No lymphadenopathy. ABDOMINAL BONES AND SOFT TISSUES: Possible acute to subacute mild compression deformity of L1 vertebral body is noted. No acute soft tissue abnormality. IMPRESSION: 1. Rounded right lung base mass concerning for malignancy , with osseous metastasis involving the posterior right seventh rib. 2. Possible acute to subacute mild compression deformity of L1 vertebral body is noted. 3. Small hiatal hernia. 4. Status post cholecystectomy. Electronically signed by: Lynwood Seip MD 07/15/2024 11:09 AM EST RP Workstation: [id number]      Discharge Exam: Vitals:   07/20/24 1026 07/20/24 1319  BP: 120/62 115/61  Pulse: 66 100  Resp: 18   Temp: 97.9 F (36.6 C)   SpO2: 94% 95%   Vitals:   07/20/24 0444 07/20/24 0500 07/20/24 1026 07/20/24 1319  BP: (!) 143/66  120/62 115/61  Pulse: 97  66 100  Resp: 20  18   Temp: 99.7 F (37.6 C)  97.9 F (36.6 C)   TempSrc:      SpO2: 94%  94% 95%  Weight:  70.6  kg    Height:        Constitutional:  Normal appearance. Non toxic-appearing.  HENT: Head Normocephalic and atraumatic.  Mucous membranes are moist.  Eyes:  Extraocular intact. Conjunctivae normal.  Cardiovascular: Rate and Rhythm: Normal rate and regular rhythm.  Pulmonary: Non labored, symmetric rise of chest wall.  Skin: warm and dry. not jaundiced.  Neurological: No focal deficit present. alert. Oriented.  Psychiatric: Mood and Affect congruent.    The results of significant diagnostics from this hospitalization (including imaging, microbiology, ancillary and laboratory) are listed below for reference.     Microbiology: Recent Results (from the past 240 hours)  Blood culture (routine x 2)     Status: None (Preliminary result)   Collection Time: 07/18/24  7:33 PM   Specimen: BLOOD  Result Value Ref Range Status   Specimen Description BLOOD BLOOD RIGHT ARM  Final   Special Requests   Final    BOTTLES DRAWN  AEROBIC AND ANAEROBIC Blood Culture results may not be optimal due to an inadequate volume of blood received in culture bottles   Culture   Final    NO GROWTH 2 DAYS Performed at [facility], [address], [place], [place] [address]    Report Status PENDING  Incomplete  Blood culture (routine x 2)     Status: None (Preliminary result)   Collection Time: 07/18/24  7:33 PM   Specimen: BLOOD  Result Value Ref Range Status   Specimen Description BLOOD BLOOD LEFT ARM  Final   Special Requests   Final    BOTTLES DRAWN AEROBIC AND ANAEROBIC Blood Culture results may not be optimal due to an inadequate volume of blood received in culture bottles   Culture   Final    NO GROWTH 2 DAYS Performed at [facility], [address] [place], [place] [address]    Report Status PENDING  Incomplete  Resp panel by RT-PCR (RSV, Flu A&B, Covid) Anterior Nasal Swab     Status: None   Collection Time: 07/19/24  2:30 AM   Specimen: Anterior Nasal Swab  Result Value Ref Range Status   SARS Coronavirus 2 by RT PCR NEGATIVE NEGATIVE Final    Comment: (NOTE) SARS-CoV-2 target nucleic acids are NOT DETECTED.  The SARS-CoV-2 RNA is generally detectable in upper respiratory specimens during the acute phase of infection. The lowest concentration of SARS-CoV-2 viral copies this assay can detect is 138 copies/mL. A negative result does not preclude SARS-Cov-2 infection and should not be used as the sole basis for treatment or other patient management decisions. A negative result may occur with  improper specimen collection/handling, submission of specimen other than nasopharyngeal swab, presence of viral mutation(s) within the areas targeted by this assay, and inadequate number of viral copies(<138 copies/mL). A negative result must be combined with clinical observations, patient history, and epidemiological information. The expected result is Negative.  Fact Sheet for Patients:   bloggercourse.com  Fact Sheet for Healthcare Providers:  seriousbroker.it  This test is no t yet approved or cleared by the [place]  FDA and  has been authorized for detection and/or diagnosis of SARS-CoV-2 by FDA under an Emergency Use Authorization (EUA). This EUA will remain  in effect (meaning this test can be used) for the duration of the COVID-19 declaration under Section 564(b)(1) of the Act, 21 U.S.C.section 360bbb-3(b)(1), unless the authorization is terminated  or revoked sooner.       Influenza A by PCR NEGATIVE NEGATIVE Final  Influenza B by PCR NEGATIVE NEGATIVE Final    Comment: (NOTE) The Xpert Xpress SARS-CoV-2/FLU/RSV plus assay is intended as an aid in the diagnosis of influenza from Nasopharyngeal swab specimens and should not be used as a sole basis for treatment. Nasal washings and aspirates are unacceptable for Xpert Xpress SARS-CoV-2/FLU/RSV testing.  Fact Sheet for Patients: bloggercourse.com  Fact Sheet for Healthcare Providers: seriousbroker.it  This test is not yet approved or cleared by the [place]  FDA and has been authorized for detection and/or diagnosis of SARS-CoV-2 by FDA under an Emergency Use Authorization (EUA). This EUA will remain in effect (meaning this test can be used) for the duration of the COVID-19 declaration under Section 564(b)(1) of the Act, 21 U.S.C. section 360bbb-3(b)(1), unless the authorization is terminated or revoked.     Resp Syncytial Virus by PCR NEGATIVE NEGATIVE Final    Comment: (NOTE) Fact Sheet for Patients: bloggercourse.com  Fact Sheet for Healthcare Providers: seriousbroker.it  This test is not yet approved or cleared by the [place]  FDA and has been authorized for detection and/or diagnosis of SARS-CoV-2 by FDA under an Emergency Use  Authorization (EUA). This EUA will remain in effect (meaning this test can be used) for the duration of the COVID-19 declaration under Section 564(b)(1) of the Act, 21 U.S.C. section 360bbb-3(b)(1), unless the authorization is terminated or revoked.  Performed at [facility], [address] [place], [place] [address]   Respiratory (~20 pathogens) panel by PCR     Status: Abnormal   Collection Time: 07/19/24  9:20 AM   Specimen: Nasopharyngeal Swab; Respiratory  Result Value Ref Range Status   Adenovirus NOT DETECTED NOT DETECTED Final   Coronavirus 229E NOT DETECTED NOT DETECTED Final    Comment: (NOTE) The Coronavirus on the Respiratory Panel, DOES NOT test for the novel  Coronavirus (2019 nCoV)    Coronavirus HKU1 NOT DETECTED NOT DETECTED Final   Coronavirus NL63 NOT DETECTED NOT DETECTED Final   Coronavirus OC43 NOT DETECTED NOT DETECTED Final   Metapneumovirus NOT DETECTED NOT DETECTED Final   Rhinovirus / Enterovirus DETECTED (A) NOT DETECTED Final   Influenza A NOT DETECTED NOT DETECTED Final   Influenza B NOT DETECTED NOT DETECTED Final   Parainfluenza Virus 1 NOT DETECTED NOT DETECTED Final   Parainfluenza Virus 2 NOT DETECTED NOT DETECTED Final   Parainfluenza Virus 3 NOT DETECTED NOT DETECTED Final   Parainfluenza Virus 4 NOT DETECTED NOT DETECTED Final   Respiratory Syncytial Virus NOT DETECTED NOT DETECTED Final   Bordetella pertussis NOT DETECTED NOT DETECTED Final   Bordetella Parapertussis NOT DETECTED NOT DETECTED Final   Chlamydophila pneumoniae NOT DETECTED NOT DETECTED Final   Mycoplasma pneumoniae NOT DETECTED NOT DETECTED Final    Comment: Performed at [facility], [address], [place], [place] [address]     Labs: BNP (last 3 results) Recent Labs    07/18/24 1710  BNP 315.4*   Basic Metabolic Panel: Recent Labs  Lab 07/18/24 1710 07/18/24 2229 07/19/24 0552 07/19/24 1358 07/20/24 0509  NA 128* 128* 126* 129* 127*  K 4.1  3.5 3.6 4.0 3.9  CL 93* 96* 92* 94* 94*  CO2 23 22 23 25 22   GLUCOSE 94 167* 109* 113* 88  BUN 16 15 16 20 17   CREATININE 0.70 0.58* 0.56* 0.64 0.50*  CALCIUM  8.4* 7.7* 8.2* 8.4* 8.1*  MG 1.8  --   --   --   --    Liver Function Tests:  Recent Labs  Lab 07/15/24 0812 07/18/24 1710  AST 38 43*  ALT 26 23  ALKPHOS 161* 160*  BILITOT 1.2 1.1  PROT 6.8 7.0  ALBUMIN 3.1* 3.0*   No results for input(s): LIPASE, AMYLASE in the last 168 hours. No results for input(s): AMMONIA in the last 168 hours. CBC: Recent Labs  Lab 07/15/24 0812 07/18/24 1710 07/19/24 0552  WBC 21.2* 22.1* 20.7*  NEUTROABS  --  17.7*  --   HGB 12.2* 11.6* 10.2*  HCT 35.6* 34.7* 29.5*  MCV 85.0 86.8 85.0  PLT 202 162 149*   Cardiac Enzymes: No results for input(s): CKTOTAL, CKMB, CKMBINDEX, TROPONINI in the last 168 hours. BNP: Invalid input(s): POCBNP CBG: No results for input(s): GLUCAP in the last 168 hours. D-Dimer No results for input(s): DDIMER in the last 72 hours. Hgb A1c No results for input(s): HGBA1C in the last 72 hours. Lipid Profile No results for input(s): CHOL, HDL, LDLCALC, TRIG, CHOLHDL, LDLDIRECT in the last 72 hours. Thyroid function studies No results for input(s): TSH, T4TOTAL, T3FREE, THYROIDAB in the last 72 hours.  Invalid input(s): FREET3 Anemia work up No results for input(s): VITAMINB12, FOLATE, FERRITIN, TIBC, IRON, RETICCTPCT in the last 72 hours. Urinalysis    Component Value Date/Time   COLORURINE YELLOW (A) 07/15/2024 1016   APPEARANCEUR CLOUDY (A) 07/15/2024 1016   LABSPEC 1.015 07/15/2024 1016   PHURINE 7.0 07/15/2024 1016   GLUCOSEU NEGATIVE 07/15/2024 1016   HGBUR NEGATIVE 07/15/2024 1016   HGBUR negative 08/12/2010 1451   BILIRUBINUR NEGATIVE 07/15/2024 1016   KETONESUR NEGATIVE 07/15/2024 1016   PROTEINUR NEGATIVE 07/15/2024 1016   UROBILINOGEN 0.2 08/12/2010 1451   NITRITE NEGATIVE 07/15/2024  1016   LEUKOCYTESUR NEGATIVE 07/15/2024 1016   Sepsis Labs Recent Labs  Lab 07/15/24 0812 07/18/24 1710 07/19/24 0552  WBC 21.2* 22.1* 20.7*   Microbiology Recent Results (from the past 240 hours)  Blood culture (routine x 2)     Status: None (Preliminary result)   Collection Time: 07/18/24  7:33 PM   Specimen: BLOOD  Result Value Ref Range Status   Specimen Description BLOOD BLOOD RIGHT ARM  Final   Special Requests   Final    BOTTLES DRAWN AEROBIC AND ANAEROBIC Blood Culture results may not be optimal due to an inadequate volume of blood received in culture bottles   Culture   Final    NO GROWTH 2 DAYS Performed at [facility], [address] [place], [place] [address]    Report Status PENDING  Incomplete  Blood culture (routine x 2)     Status: None (Preliminary result)   Collection Time: 07/18/24  7:33 PM   Specimen: BLOOD  Result Value Ref Range Status   Specimen Description BLOOD BLOOD LEFT ARM  Final   Special Requests   Final    BOTTLES DRAWN AEROBIC AND ANAEROBIC Blood Culture results may not be optimal due to an inadequate volume of blood received in culture bottles   Culture   Final    NO GROWTH 2 DAYS Performed at [facility], [address] [place], [place] [address]    Report Status PENDING  Incomplete  Resp panel by RT-PCR (RSV, Flu A&B, Covid) Anterior Nasal Swab     Status: None   Collection Time: 07/19/24  2:30 AM   Specimen: Anterior Nasal Swab  Result Value Ref Range Status   SARS Coronavirus 2 by RT PCR NEGATIVE NEGATIVE Final    Comment: (NOTE) SARS-CoV-2 target nucleic  acids are NOT DETECTED.  The SARS-CoV-2 RNA is generally detectable in upper respiratory specimens during the acute phase of infection. The lowest concentration of SARS-CoV-2 viral copies this assay can detect is 138 copies/mL. A negative result does not preclude SARS-Cov-2 infection and should not be used as the sole basis for treatment or other patient  management decisions. A negative result may occur with  improper specimen collection/handling, submission of specimen other than nasopharyngeal swab, presence of viral mutation(s) within the areas targeted by this assay, and inadequate number of viral copies(<138 copies/mL). A negative result must be combined with clinical observations, patient history, and epidemiological information. The expected result is Negative.  Fact Sheet for Patients:  bloggercourse.com  Fact Sheet for Healthcare Providers:  seriousbroker.it  This test is no t yet approved or cleared by the [place]  FDA and  has been authorized for detection and/or diagnosis of SARS-CoV-2 by FDA under an Emergency Use Authorization (EUA). This EUA will remain  in effect (meaning this test can be used) for the duration of the COVID-19 declaration under Section 564(b)(1) of the Act, 21 U.S.C.section 360bbb-3(b)(1), unless the authorization is terminated  or revoked sooner.       Influenza A by PCR NEGATIVE NEGATIVE Final   Influenza B by PCR NEGATIVE NEGATIVE Final    Comment: (NOTE) The Xpert Xpress SARS-CoV-2/FLU/RSV plus assay is intended as an aid in the diagnosis of influenza from Nasopharyngeal swab specimens and should not be used as a sole basis for treatment. Nasal washings and aspirates are unacceptable for Xpert Xpress SARS-CoV-2/FLU/RSV testing.  Fact Sheet for Patients: bloggercourse.com  Fact Sheet for Healthcare Providers: seriousbroker.it  This test is not yet approved or cleared by the [place]  FDA and has been authorized for detection and/or diagnosis of SARS-CoV-2 by FDA under an Emergency Use Authorization (EUA). This EUA will remain in effect (meaning this test can be used) for the duration of the COVID-19 declaration under Section 564(b)(1) of the Act, 21 U.S.C. section 360bbb-3(b)(1),  unless the authorization is terminated or revoked.     Resp Syncytial Virus by PCR NEGATIVE NEGATIVE Final    Comment: (NOTE) Fact Sheet for Patients: bloggercourse.com  Fact Sheet for Healthcare Providers: seriousbroker.it  This test is not yet approved or cleared by the [place]  FDA and has been authorized for detection and/or diagnosis of SARS-CoV-2 by FDA under an Emergency Use Authorization (EUA). This EUA will remain in effect (meaning this test can be used) for the duration of the COVID-19 declaration under Section 564(b)(1) of the Act, 21 U.S.C. section 360bbb-3(b)(1), unless the authorization is terminated or revoked.  Performed at [facility], [address] [place], [place] [address]   Respiratory (~20 pathogens) panel by PCR     Status: Abnormal   Collection Time: 07/19/24  9:20 AM   Specimen: Nasopharyngeal Swab; Respiratory  Result Value Ref Range Status   Adenovirus NOT DETECTED NOT DETECTED Final   Coronavirus 229E NOT DETECTED NOT DETECTED Final    Comment: (NOTE) The Coronavirus on the Respiratory Panel, DOES NOT test for the novel  Coronavirus (2019 nCoV)    Coronavirus HKU1 NOT DETECTED NOT DETECTED Final   Coronavirus NL63 NOT DETECTED NOT DETECTED Final   Coronavirus OC43 NOT DETECTED NOT DETECTED Final   Metapneumovirus NOT DETECTED NOT DETECTED Final   Rhinovirus / Enterovirus DETECTED (A) NOT DETECTED Final   Influenza A NOT DETECTED NOT DETECTED Final   Influenza B NOT DETECTED NOT DETECTED Final  Parainfluenza Virus 1 NOT DETECTED NOT DETECTED Final   Parainfluenza Virus 2 NOT DETECTED NOT DETECTED Final   Parainfluenza Virus 3 NOT DETECTED NOT DETECTED Final   Parainfluenza Virus 4 NOT DETECTED NOT DETECTED Final   Respiratory Syncytial Virus NOT DETECTED NOT DETECTED Final   Bordetella pertussis NOT DETECTED NOT DETECTED Final   Bordetella Parapertussis NOT DETECTED NOT  DETECTED Final   Chlamydophila pneumoniae NOT DETECTED NOT DETECTED Final   Mycoplasma pneumoniae NOT DETECTED NOT DETECTED Final    Comment: Performed at [facility], [address] [place], [place] [address]     Time coordinating discharge: 32 min   SIGNED: Shaunda Tipping, DO [facility] 07/20/2024, 2:51 PM Pager   If 7PM-7AM, please contact night-coverage

## 2024-07-21 ENCOUNTER — Telehealth: Payer: Self-pay | Admitting: Cardiovascular Disease

## 2024-07-21 ENCOUNTER — Telehealth: Payer: Self-pay

## 2024-07-21 NOTE — Transitions of Care (Post Inpatient/ED Visit) (Signed)
 07/21/2024  Name: Barry Small MRN: [id number] DOB: 31-Dec-1933  Today's TOC FU Call Status: Today's TOC FU Call Status:: Successful TOC FU Call Completed TOC FU Call Complete Date: 07/21/24  Patient's Name and Date of Birth confirmed. DOB  Transition Care Management Follow-up Telephone Call Date of Discharge: 07/20/24 Discharge Facility: [facility]) Type of Discharge: Inpatient Admission Primary Inpatient Discharge Diagnosis:: pneumonia How have you been since you were released from the hospital?: Better Any questions or concerns?: No  Items Reviewed: Did you receive and understand the discharge instructions provided?: Yes Medications obtained,verified, and reconciled?: Yes (Medications Reviewed) Any new allergies since your discharge?: No Dietary orders reviewed?: Yes Do you have support at home?: Yes People in Home [RPT]: spouse  Medications Reviewed Today: Medications Reviewed Today     Reviewed by Emmitt Pan, LPN (Licensed Practical Nurse) on 07/21/24 at 1029  Med List Status: <None>   Medication Order Taking? Sig Documenting Provider Last Dose Status Informant  amoxicillin-clavulanate (AUGMENTIN) 875-125 MG tablet [id number] Yes Take 1 tablet by mouth 2 (two) times daily for 2 days. Dezii, Alexandra, DO  Active   aspirin 81 MG EC tablet [id number] Yes Take 81 mg by mouth daily. [provider]  Active Spouse/Significant Other, Pharmacy Records  Cholecalciferol (VITAMIN D3) 2000 units TABS [id number] Yes Take by mouth daily. [provider]  Active Spouse/Significant Other, Pharmacy Records  metoprolol  tartrate (LOPRESSOR ) 25 MG tablet [id number] Yes Take 1 tablet (25 mg total) by mouth 2 (two) times daily. Dezii, Alexandra, DO  Active   Multiple Vitamins-Minerals (MULTIVITAMIN) tablet [id number] Yes Take 1 tablet by mouth daily. [name], MD  Active Spouse/Significant Other, Pharmacy Records  nitroGLYCERIN   (NITROSTAT ) 0.4 MG SL tablet [id number] Yes DISSOLVE ONE TABLET UNDER TONGUE EVERY 5 MINUTES AS NEEDED FOR CHEST PAIN Gollan, Timothy J, MD  Active Spouse/Significant Other, Pharmacy Records  omeprazole  [facility]) 20 MG capsule [id number] Yes Take 1 capsule (20 mg total) by mouth every Monday, Wednesday, and Friday. [name], MD  Active Spouse/Significant Other, Pharmacy Records  oxyCODONE (ROXICODONE) 5 MG immediate release tablet [id number] Yes Take 1 tablet (5 mg total) by mouth every 8 (eight) hours as needed for up to 14 days. Dezii, Alexandra, DO  Active   rosuvastatin  (CRESTOR ) 20 MG tablet [id number] Yes Take 1 tablet (20 mg total) by mouth daily. [name], MD  Active Spouse/Significant Other, Pharmacy Records  sodium chloride  1 g tablet [id number] Yes Take 1 tablet (1 g total) by mouth 2 (two) times daily with a meal for 14 days. Dezii, Alexandra, DO  Active             Home Care and Equipment/Supplies: Were Home Health Services Ordered?: NA Any new equipment or medical supplies ordered?: NA  Functional Questionnaire: Do you need assistance with bathing/showering or dressing?: No Do you need assistance with meal preparation?: No Do you need assistance with eating?: No Do you have difficulty maintaining continence: No Do you need assistance with getting out of bed/getting out of a chair/moving?: No Do you have difficulty managing or taking your medications?: No  Follow up appointments reviewed: PCP Follow-up appointment confirmed?: Yes Date of PCP follow-up appointment?: 07/25/24 Follow-up Provider: [facility] Follow-up appointment confirmed?: Yes Date of Specialist follow-up appointment?: 07/22/24 Follow-Up Specialty Provider:: onco Do you need transportation to your follow-up appointment?: No Do you understand care options if your condition(s) worsen?: Yes-patient verbalized understanding    SIGNATURE Pan Emmitt, LPN [facility]  Nurse Health  Advisor Direct Dial [phone]

## 2024-07-21 NOTE — Telephone Encounter (Signed)
   Pre-operative Risk Assessment    Patient Name: Barry Small  DOB: [birth year] MRN: [id number]   Date of last office visit: unknown Date of next office visit: unknown   Request for Surgical Clearance    Procedure:  Ct guided lung mass biopsy  Date of Surgery:  Clearance TBD                                Surgeon:  TBD Surgeon's Group or Practice Name:  [facility] Phone number:  [phone] Fax number:  [phone]   Type of Clearance Requested:   - Pharmacy:  Hold Aspirin hold asprin 5 days prior to biopsy   Type of Anesthesia:  Not Indicated   Additional requests/questions:    [name]   07/21/2024, 10:29 AM

## 2024-07-21 NOTE — Telephone Encounter (Signed)
 Left message for the pt to call back to schedule tele preop appt.   Addendum to clearance request:  Date of last visit: 05/17/24 Dr. Kennyth and 04/04/24 Dr. Perla  Date of next visit: NONE

## 2024-07-21 NOTE — Telephone Encounter (Signed)
   Name: Barry Small  DOB: 1934/04/28  MRN: [id number]  Primary Cardiologist: None   Preoperative team, please contact this patient and set up a phone call appointment for further preoperative risk assessment. Please obtain consent and complete medication review. Thank you for your help.  I confirm that guidance regarding antiplatelet and oral anticoagulation therapy has been completed and, if necessary, noted below.  Regarding ASA therapy, we recommend continuation of ASA throughout the perioperative period. However, if the surgeon feels that cessation of ASA is required in the perioperative period, it may be stopped 5-7 days prior to surgery with a plan to resume it as soon as felt to be feasible from a surgical standpoint in the post-operative period.    I also confirmed the patient resides in the state of [place] . As per [employer] Medical [employer] telemedicine laws, the patient must reside in the state in which the provider is licensed.   Josefa CHRISTELLA Beauvais, NP 07/21/2024, 4:44 PM [facility]

## 2024-07-21 NOTE — Telephone Encounter (Signed)
 Noted. Thanks.

## 2024-07-22 ENCOUNTER — Inpatient Hospital Stay
Admit: 2024-07-22 | Discharge: 2024-07-22 | Disposition: A | Discharge status: Home / Self Care | Attending: Oncology | Admitting: Oncology

## 2024-07-22 DIAGNOSIS — R918 Other nonspecific abnormal finding of lung field: Secondary | ICD-10-CM | POA: Diagnosis not present

## 2024-07-22 DIAGNOSIS — C7951 Secondary malignant neoplasm of bone: Secondary | ICD-10-CM | POA: Diagnosis not present

## 2024-07-22 LAB — GLUCOSE, CAPILLARY: Glucose-Capillary: 108 mg/dL — ABNORMAL HIGH (ref 70–99)

## 2024-07-22 MED ORDER — FLUDEOXYGLUCOSE F - 18 (FDG) INJECTION
8.6900 | Freq: Once | INTRAVENOUS | Status: AC | PRN
  Administered 2024-07-22: 8.69 via INTRAVENOUS

## 2024-07-22 NOTE — Telephone Encounter (Signed)
 2nd attempt; I tried to call the pt to schedule tele. Someone answered but hung up.

## 2024-07-22 NOTE — Progress Notes (Signed)
  Progress Note   Date: 07/20/2024  Patient Name: Barry Small        MRN#: [id number]   Clarification of diagnosis: Sepsis without Organ damage

## 2024-07-23 LAB — CULTURE, BLOOD (ROUTINE X 2)
Culture: NO GROWTH
Culture: NO GROWTH

## 2024-07-25 ENCOUNTER — Encounter: Payer: Self-pay | Admitting: Family Medicine

## 2024-07-25 ENCOUNTER — Telehealth: Payer: Self-pay

## 2024-07-25 ENCOUNTER — Telehealth: Payer: Self-pay | Admitting: Family Medicine

## 2024-07-25 ENCOUNTER — Ambulatory Visit: Admitting: Family Medicine

## 2024-07-25 VITALS — BP 138/68 | HR 55 | Temp 97.9°F | Ht 65.25 in | Wt 159.4 lb

## 2024-07-25 DIAGNOSIS — R918 Other nonspecific abnormal finding of lung field: Secondary | ICD-10-CM

## 2024-07-25 DIAGNOSIS — E871 Hypo-osmolality and hyponatremia: Secondary | ICD-10-CM

## 2024-07-25 DIAGNOSIS — M549 Dorsalgia, unspecified: Secondary | ICD-10-CM

## 2024-07-25 LAB — COMPREHENSIVE METABOLIC PANEL WITH GFR
ALT: 27 U/L (ref 0–53)
AST: 44 U/L — ABNORMAL HIGH (ref 0–37)
Albumin: 3 g/dL — ABNORMAL LOW (ref 3.5–5.2)
Alkaline Phosphatase: 166 U/L — ABNORMAL HIGH (ref 39–117)
BUN: 15 mg/dL (ref 6–23)
CO2: 25 meq/L (ref 19–32)
Calcium: 8.4 mg/dL (ref 8.4–10.5)
Chloride: 95 meq/L — ABNORMAL LOW (ref 96–112)
Creatinine, Ser: 0.57 mg/dL (ref 0.40–1.50)
GFR: 87.76 mL/min (ref >60.00)
Glucose, Bld: 93 mg/dL (ref 70–99)
Potassium: 3.9 meq/L (ref 3.5–5.1)
Sodium: 129 meq/L — ABNORMAL LOW (ref 135–145)
Total Bilirubin: 0.7 mg/dL (ref 0.2–1.2)
Total Protein: 6 g/dL (ref 6.0–8.3)

## 2024-07-25 LAB — CBC WITH DIFFERENTIAL/PLATELET
Basophils Absolute: 0.1 K/uL (ref 0.0–0.1)
Basophils Relative: 0.3 % (ref 0.0–3.0)
Eosinophils Absolute: 0.2 K/uL (ref 0.0–0.7)
Eosinophils Relative: 1 % (ref 0.0–5.0)
HCT: 32.7 % — ABNORMAL LOW (ref 39.0–52.0)
Hemoglobin: 11 g/dL — ABNORMAL LOW (ref 13.0–17.0)
Lymphocytes Relative: 6.7 % — ABNORMAL LOW (ref 12.0–46.0)
Lymphs Abs: 1.3 K/uL (ref 0.7–4.0)
MCHC: 33.8 g/dL (ref 30.0–36.0)
MCV: 86.8 fl (ref 78.0–100.0)
Monocytes Absolute: 1.7 K/uL — ABNORMAL HIGH (ref 0.1–1.0)
Monocytes Relative: 8.4 % (ref 3.0–12.0)
Neutro Abs: 16.3 K/uL — ABNORMAL HIGH (ref 1.4–7.7)
Neutrophils Relative %: 83.6 % — ABNORMAL HIGH (ref 43.0–77.0)
Platelets: 232 K/uL (ref 150.0–400.0)
RBC: 3.77 Mil/uL — ABNORMAL LOW (ref 4.22–5.81)
RDW: 17.3 % — ABNORMAL HIGH (ref 11.5–15.5)
WBC: 19.6 K/uL (ref 4.0–10.5)

## 2024-07-25 MED ORDER — SODIUM CHLORIDE 1 G PO TABS: 1.0000 g | ORAL_TABLET | Freq: Two times a day (BID) | ORAL | 1 refills | Status: DC

## 2024-07-25 MED ORDER — ACETAMINOPHEN 500 MG PO TABS: 1000.0000 mg | ORAL_TABLET | Freq: Three times a day (TID) | ORAL | Status: DC | PRN

## 2024-07-25 NOTE — Progress Notes (Unsigned)
  1. Hypermetabolic right lower lobe pulmonary mass consistent with primary bronchogenic carcinoma. 2. Widespread hypermetabolic osseous metastatic disease with possible L1 pathologic fracture. There is a large lytic lesion in the right iliac crest. Given the extent of spinal involvement, patient at risk for additional pathologic fractures. 3. Hypermetabolic activity centrally in the right hepatic lobe suspicious for metastatic disease. 4. Focal hypermetabolic activity within the left adrenal gland suspicious for metastatic disease. 5. Small bilateral pleural effusions without hypermetabolic activity. 6.  Aortic Atherosclerosis (ICD10-I70.0).   =================  Admit date: 07/18/2024 Discharge date: 07/20/2024     Recommendations for Outpatient Follow-up:  Follow up with PCP in 1-2  weeks to recheck sodium levels and blood pressure. Follow-up with oncology for biopsy   Hospital Course: Barry Small with recently found lung mass with metastatic spread to right rib, systolic CHF with EF 55 to 50%, hypertension, hyperlipidemia, CAD status post CABG, chronic tachycardia with bigeminy, chronic tremor, Barrett's esophagus, bilateral proptosis, who presents with dyspnea.  He was found to have leukocytosis of 22, hyponatremia 128, tachycardia to 120, O2 sat of 89% on room air.  He was admitted for workup.  CXR concerning for patchy bibasilar opacities.  He was started on treatment for community-acquired pneumonia.  Respiratory viral panel resulted positive for rhinovirus.  Patient completed 3 days of azithromycin and ceftriaxone  therapy.  By evaluation on 11/12 he reported feeling back to his baseline.  He was comfortable room air with sat of 95%. He has scheduled outpatient follow-up with oncology for CT-guided biopsy and PET scan to better evaluate lung mass and oncology therapy planning. Meds to beds delivered medications prior to discharge   Community-acquired  pneumonia Rhinovirus - Leukocytosis complicated by malignancy.  CXR with opacities. - No further fever - Has now weaned from O2 - Status post 3 days Rocephin  and azithromycin.  Complete course with an additional 2 days of Augmentin at home - Mucinex as needed for cough   Acute on chronic heart failure with reduced EF - EF 45 to 50%, grade 2 diastolic dysfunction - Clinically euvolemic on exam   Lung mass - CTA shows rounded right lung base mass concerning for malignancy with osseous metastasis involving the posterior right seventh rib - Patient has a career as a it sales professional - Dr. Jacobo of oncology has been consulted.  CT-guided outpatient biopsy and PET scan has been arranged.  Hold aspirin until that time.  Patient has been counseled - Pain control with Tylenol and Oxy.  PDMP reviewed.   L1 vertebral body compression fracture - Seen on CTA - Consistent with pain on exam - Continue lidocaine patches, scheduled Tylenol, as needed oxy - Have counseled extensively on not returning to the chiropractor whom was apparently treating the patient's back and rib pain.   Tachycardia - Increased home dose metoprolol  at DC   Hyponatremia - Sodium 128 on arrival, improved mildly, back to 127 now. - Fluid restriction - Continue with salt tabs for 14 days. 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 - Urine sodium: 133 urine osmo: 498: Serum Osmo: 269, low  =====================   D/w pt about oxycodone use.  More pain in the afternoons and nights.  D/w pt about using 2.5-5 mg as needed.    RRR with occ ectopy.  CTAB 1+ BLE edema.

## 2024-07-25 NOTE — Telephone Encounter (Addendum)
  CRITICAL VALUE: 19.6 WBC   RECEIVER (on-site recipient of call):Lori Liew   DATE & TIME NOTIFIED: 4:06 PM 07/25/24   MESSENGER (representative from lab):Karen   MD NOTIFIED: Dr. Arlyss Solian  TIME OF NOTIFICATION:4:06 PM  RESPONSE: Message sent to PCP and put in red book . PCP and CMA made aware of lab     Latest Ref Rng & Units 07/19/2024    5:52 AM 07/18/2024    5:10 PM 07/15/2024    8:12 AM  CBC  WBC 4.0 - 10.5 K/uL 20.7  22.1  21.2   Hemoglobin 13.0 - 17.0 g/dL 89.7  88.3  87.7   Hematocrit 39.0 - 52.0 % 29.5  34.7  35.6   Platelets 150 - 400 K/uL 149  162  202

## 2024-07-25 NOTE — Patient Instructions (Signed)
 I would take 2 tylenol 3 times a day.  Then take oxycodone if needed on top of that.  Take care.  Glad to see you.  I sent a note to Dr. Jacobo about getting the biopsy set up.   Go to the lab on the way out.   If you have mychart we'll likely use that to update you.

## 2024-07-25 NOTE — Telephone Encounter (Signed)
 This is slightly better than prior, awaiting full panel report.  Thanks.

## 2024-07-25 NOTE — Telephone Encounter (Signed)
 Please update patient and wife. Per Dr. Jacobo, his biopsy is scheduled for November 19 and then he'll see Dr. Jacobo the week after that.   Thanks.

## 2024-07-25 NOTE — Progress Notes (Signed)
 [name], MD sent to [name] Approved for CT guided biopsy of RIGHT POSTERIOR 7th RIB MASS - suspect metastatic lung cancer.  With sedation  HKM

## 2024-07-26 ENCOUNTER — Other Ambulatory Visit: Payer: Self-pay | Admitting: Radiology

## 2024-07-26 ENCOUNTER — Other Ambulatory Visit: Payer: Self-pay | Admitting: *Deleted

## 2024-07-26 ENCOUNTER — Inpatient Hospital Stay

## 2024-07-26 DIAGNOSIS — R918 Other nonspecific abnormal finding of lung field: Secondary | ICD-10-CM

## 2024-07-26 NOTE — Telephone Encounter (Signed)
 Patient's wife Sharman is aware that the Patient has a biopsy on 07/27/24 and then see Dr. Jacobo the week after.

## 2024-07-26 NOTE — H&P (Signed)
 Chief Complaint: Iliac bone lesion.  Request is for biopsy for further evaluation os metastatic lung cancer.    Referring Physician(s): Finnegan,Timothy J  Supervising Physician: Philip Cornet  Patient Status: [facility] - Out-pt  History of Present Illness: Barry Small is a 89 y.o. male  inpatient. History of CAD, HLD, HTN, MI, Found to have a lung mass while being worked up for back pain, hypotension and dizziness. PET scan from 11.14.25 reads here are additional lesions within the right scapula, sternum, multiple ribs, multiple thoracolumbar vertebral bodies, throughout the bony pelvis and in the proximal left femur. Team is requesting right posterior 7th rib mass biopsy for further evaluation of metastatic lung cancer.  Case approved by IR Attending Dr. Wilkie Lent. After review of imagining by  IR Attending Dr. Cornet Philip at [facility] recommends iliac bone lesion. Patient is agreeable to change of plan  Wife at bedside. Endorsing upper bilateral pain related to fall from standing this morning. No LOC. Patient alert and laying in bed,calm. Denies any fevers, headache, chest pain, SOB, cough, abdominal pain, nausea, vomiting or bleeding.   Labs pending. Patient is on ASA 81 mg.  Allergies include morphine. Patient has been NPO since midnight.   Return precautions and treatment recommendations and follow-up discussed with the patient  and his wife. Both who are agreeable with the plan.    Past Medical History:  Diagnosis Date   Barrett's esophagus    Colon polyps 04/2003   type unknown    Coronary artery disease    Elevated bilirubin    Elevated glucose    GERD (gastroesophageal reflux disease)    Hyperlipidemia    Hypertension    Myocardial infarction [facility])    Pneumonia 2014    Past Surgical History:  Procedure Laterality Date   BELPHAROPTOSIS REPAIR  08/2018   Cardiolite  12/2001   h/o vessel infarct EF 66%   CATARACT EXTRACTION W/ INTRAOCULAR LENS  IMPLANT, BILATERAL   09/2018   CHOLECYSTECTOMY  09/2001   COLONOSCOPY  11/15/2008   Mild divertics, 5 yrs, Dr. Aneita   COLONOSCOPY W/ POLYPECTOMY  05/09/2003   x multiple, Dr. Viktoria   CORONARY ANGIOPLASTY  2001   x 2, Dr.Pulsipher   CORONARY ANGIOPLASTY WITH STENT PLACEMENT  12/05/2005   CORONARY ARTERY BYPASS GRAFT  1989   [facility]   ESOPHAGOGASTRODUODENOSCOPY  05/09/2003   Barrett's   ESOPHAGOGASTRODUODENOSCOPY  11/17/08   Barrett's Esoph [facility] (Dr. Aneita)  2 years   EYE SURGERY  09/2018   cataract extraction   [facility]  03/28-03/31/2007   Chest pain   NASAL SINUS SURGERY  06/23/2008   Dr.Clark   NOSE SURGERY  06/23/2008   Dr. Gretta   Stress Myoview   09/30/2004   Normal EF 70%   Stress Myoview   02/11/2006   No change, c/w 09/2004   Stress Myoview   02/22/2008   Abnormal w/prior inf infarct  No signif ischemia    Allergies: Atorvastatin, Morphine, and Simvastatin  Medications: Prior to Admission medications   Medication Sig Start Date End Date Taking? Authorizing Provider  acetaminophen (TYLENOL) 500 MG tablet Take 2 tablets (1,000 mg total) by mouth every 8 (eight) hours as needed. 07/25/24   [name], MD  aspirin 81 MG EC tablet Take 81 mg by mouth daily. Patient not taking: Reported on 07/25/2024    [provider]  Cholecalciferol (VITAMIN D3) 2000 units TABS Take by mouth daily.    [provider]  metoprolol  tartrate (  LOPRESSOR ) 25 MG tablet Take 1 tablet (25 mg total) by mouth 2 (two) times daily. 07/20/24 08/19/24  Dezii, Alexandra, DO  Multiple Vitamins-Minerals (MULTIVITAMIN) tablet Take 1 tablet by mouth daily. 04/07/24   [name], MD  nitroGLYCERIN  (NITROSTAT ) 0.4 MG SL tablet DISSOLVE ONE TABLET UNDER TONGUE EVERY 5 MINUTES AS NEEDED FOR CHEST PAIN 04/04/24   Gollan, Timothy J, MD  omeprazole  (PRILOSEC) 20 MG capsule Take 1 capsule (20 mg total) by mouth every Monday, Wednesday, and Friday. 04/08/24   [name], MD  oxyCODONE (ROXICODONE) 5  MG immediate release tablet Take 1 tablet (5 mg total) by mouth every 8 (eight) hours as needed for up to 14 days. 07/20/24 08/03/24  Dezii, Alexandra, DO  rosuvastatin  (CRESTOR ) 20 MG tablet Take 1 tablet (20 mg total) by mouth daily. 04/07/24   [name], MD  sodium chloride  1 g tablet Take 1 tablet (1 g total) by mouth 2 (two) times daily with a meal. 07/25/24   [name], MD     Family History  Problem Relation Age of Onset   Heart disease Father    Pneumonia Mother    Colon cancer Neg Hx    Prostate cancer Neg Hx     Social History   Socioeconomic History   Marital status: Married    Spouse name: Not on file   Number of children: 2   Years of education: Not on file   Highest education level: Associate degree: academic program  Occupational History   Occupation: Retired Science Writer: STAGE MANAGER   Occupation: Part time    Employer: [employer]  Tobacco Use   Smoking status: Never   Smokeless tobacco: Never  Vaping Use   Vaping status: Never Used  Substance and Sexual Activity   Alcohol use: Yes    Comment: occassional beer-socially   Drug use: No   Sexual activity: Not Currently  Other Topics Concern   Not on file  Social History Narrative   Caffeine use:  1   Retired from working part-time at [employer] 2023   Retired Engineer, Structural.  Married to [employer] in 1969   Social Drivers of Health   Financial Resource Strain: Low Risk  (06/29/2024)   Overall Financial Resource Strain (CARDIA)    Difficulty of Paying Living Expenses: Not hard at all  Food Insecurity: No Food Insecurity (07/19/2024)   Hunger Vital Sign    Worried About Running Out of Food in the Last Year: Never true    Ran Out of Food in the Last Year: Never true  Transportation Needs: No Transportation Needs (07/19/2024)   PRAPARE - Administrator, Civil Service (Medical): No    Lack of Transportation (Non-Medical): No  Physical Activity: Inactive (06/29/2024)   Exercise  Vital Sign    Days of Exercise per Week: 0 days    Minutes of Exercise per Session: Not on file  Stress: Stress Concern Present (06/29/2024)   [employer] of Occupational Health - Occupational Stress Questionnaire    Feeling of Stress: To some extent  Social Connections: Moderately Integrated (07/19/2024)   Social Connection and Isolation Panel    Frequency of Communication with Friends and Family: Once a week    Frequency of Social Gatherings with Friends and Family: Once a week    Attends Religious Services: 1 to 4 times per year    Active Member of [employer] or Organizations: Yes    Attends [employer]  or Organization Meetings: More than 4 times per year    Marital Status: Married     Review of Systems: A 12 point ROS discussed and pertinent positives are indicated in the HPI above.  All other systems are negative.  Review of Systems  Constitutional:  Negative for fever.  HENT:  Negative for congestion.   Respiratory:  Negative for cough and shortness of breath.   Cardiovascular:  Negative for chest pain.  Gastrointestinal:  Negative for abdominal pain.  Musculoskeletal:  Positive for arthralgias (bilateral arm pain related to fall this am.).  Neurological:  Negative for headaches.  Psychiatric/Behavioral:  Negative for behavioral problems and confusion.     Vital Signs: BP (!) 154/73 (BP Location: Right Arm)   Pulse (!) 106   Temp 98.5 F (36.9 C) (Temporal)   Resp (!) 23   Wt 159 lb 6.3 oz (72.3 kg)   SpO2 93%   BMI 26.32 kg/m   Advance Care Plan: The advanced care plan/surrogate decision maker was discussed at the time of visit and the patient did not wish to discuss or was not able to name a surrogate decision maker or provide an advance care plan.    Physical Exam Vitals and nursing note reviewed.  Constitutional:      Appearance: He is well-developed.  HENT:     Head: Normocephalic.  Cardiovascular:     Rate and Rhythm: Tachycardia present. Rhythm irregular.   Pulmonary:     Effort: Pulmonary effort is normal.  Musculoskeletal:        General: Normal range of motion.     Cervical back: Normal range of motion.  Skin:    General: Skin is warm and dry.     Findings: Bruising present.     Comments: Bilateral elbow bruising and scratches.   Neurological:     Mental Status: He is alert and oriented to person, place, and time. Mental status is at baseline.  Psychiatric:        Mood and Affect: Mood normal.        Behavior: Behavior normal.        Thought Content: Thought content normal.        Judgment: Judgment normal.   Attempting to call Cardiology about new onset of a fib.    Imaging: NM PET Image Initial (PI) Skull Base To Thigh Result Date: 07/22/2024 CLINICAL DATA:  Initial treatment strategy for right lung mass with clinical concern for bronchogenic carcinoma and rib metastasis. EXAM: NUCLEAR MEDICINE PET SKULL BASE TO THIGH TECHNIQUE: 8.69 mCi F-18 FDG was injected intravenously. Full-ring PET imaging was performed from the skull base to thigh after the radiotracer. CT data was obtained and used for attenuation correction and anatomic localization. Fasting blood glucose: 108 mg/dl COMPARISON:  CT of the chest, abdomen and pelvis 07/15/2024. FINDINGS: Mediastinal blood pool activity: SUV max 1.9 NECK: No hypermetabolic cervical lymph nodes are identified. No suspicious activity identified within the pharyngeal mucosal space. Incidental CT findings: Bilateral carotid atherosclerosis. CHEST: There are no hypermetabolic mediastinal, hilar or axillary lymph nodes. Previously demonstrated mass laterally in the right lower lobe is hypermetabolic. This lesion measures 5.9 x 4.2 cm on image 62/6 and has an SUV max of 9.7. No other hypermetabolic pulmonary activity or suspicious nodularity. There is no hypermetabolic activity associated with a small right pleural effusion. Incidental CT findings: Underlying calcified pleural plaque formation bilaterally  with small bilateral pleural effusions. Pleural fluid extends into the right major fissure. Underlying chronic obstructive  lung disease with diffuse central airway thickening and bibasilar atelectasis. Diffuse atherosclerosis of the aorta, great vessels and coronary arteries post median sternotomy and CABG. ABDOMEN/PELVIS: Hypermetabolic activity centrally in the right hepatic lobe suspicious for metastatic disease (SUV max 5.2). Corresponding subtle hypodense lesion measuring 2.3 cm on image 65/6. Possible additional smaller metastasis more medially near the caudate lobe. There is focal hypermetabolic activity within the left adrenal gland (SUV max 4.5) without corresponding measurable mass on the CT images. No hypermetabolic activity within the right adrenal gland, pancreas, spleen or bowel. There is no hypermetabolic nodal activity in the abdomen or pelvis. Incidental CT findings: Status post cholecystectomy. Aortic and branch vessel atherosclerosis. Mild diverticular changes within the distal colon. SKELETON: Widespread hypermetabolic osseous metastatic disease. There is a large lytic lesion in the right iliac crest measuring 5.3 x 4.3 cm on image 97/6 with an SUV max of 13.3. There are additional lesions within the right scapula, sternum, multiple ribs, multiple thoracolumbar vertebral bodies, throughout the bony pelvis and in the proximal left femur. As seen on prior CT, fracture of the L1 vertebral body, potentially pathologic. Given the extent of spinal involvement, patient at risk for additional pathologic fractures. Incidental CT findings: Multilevel spondylosis. Healed median sternotomy. IMPRESSION: 1. Hypermetabolic right lower lobe pulmonary mass consistent with primary bronchogenic carcinoma. 2. Widespread hypermetabolic osseous metastatic disease with possible L1 pathologic fracture. There is a large lytic lesion in the right iliac crest. Given the extent of spinal involvement, patient at risk for  additional pathologic fractures. 3. Hypermetabolic activity centrally in the right hepatic lobe suspicious for metastatic disease. 4. Focal hypermetabolic activity within the left adrenal gland suspicious for metastatic disease. 5. Small bilateral pleural effusions without hypermetabolic activity. 6.  Aortic Atherosclerosis (ICD10-I70.0). Electronically Signed   By: Elsie Perone M.D.   On: 07/22/2024 14:22   DG Chest Portable 1 View Result Date: 07/18/2024 EXAM: 1 AP VIEW XRAY OF THE CHEST 07/18/2024 05:42:00 PM COMPARISON: None available. CLINICAL HISTORY: SOB. FINDINGS: LUNGS AND PLEURA: Hypoinflation. Patchy opacities in lung bases, atelectasis versus pneumonia. Calcified left pleural plaque. Pulmonary vascular congestion. No pulmonary edema. No pleural effusion. No pneumothorax. HEART AND MEDIASTINUM: Cardiomegaly. Aortic arch atherosclerosis. CABG markers noted. BONES AND SOFT TISSUES: Sternotomy wires noted. No acute osseous abnormality. IMPRESSION: 1. Patchy bibasilar opacities, atelectasis versus pneumonia. 2. Cardiomegaly with pulmonary vascular congestion. 3. Calcified left pleural plaque. Electronically signed by: Norman Gatlin MD 07/18/2024 06:21 PM EST RP Workstation: [id number]   CT Angio Chest/Abd/Pel for Dissection W and/or Wo Contrast Result Date: 07/15/2024 EXAM: CTA CHEST, ABDOMEN AND PELVIS WITH AND WITHOUT CONTRAST 07/15/2024 10:32:48 AM TECHNIQUE: CTA of the chest was performed with and without the administration of intravenous contrast. CTA of the abdomen and pelvis was performed with and without the administration of intravenous contrast. Multiplanar reformatted images are provided for review. MIP images are provided for review. Automated exposure control, iterative reconstruction, and/or weight based adjustment of the mA/kV was utilized to reduce the radiation dose to as low as reasonably achievable. 100 mL of iohexol  (OMNIPAQUE ) 350 MG/ML injection was administered. COMPARISON:  10/26/2020 status post coronary artery bypass graft with dissection is noted. CLINICAL HISTORY: acute back pain, hypertension, leukocytosis 21k FINDINGS: VASCULATURE: AORTA: No acute finding. No abdominal aortic aneurysm. No dissection. PULMONARY ARTERIES: No pulmonary embolism with the limits of this exam. GREAT VESSELS OF AORTIC ARCH: No acute finding. No dissection. No arterial occlusion or significant stenosis. CELIAC TRUNK: No acute finding. No occlusion or significant stenosis. SUPERIOR  MESENTERIC ARTERY: Severe narrowing at origin of superior mesenteric artery secondary to calcified plaque. INFERIOR MESENTERIC ARTERY: No acute finding. No occlusion or significant stenosis. RENAL ARTERIES: No acute finding. No occlusion or significant stenosis. ILIAC ARTERIES: No acute finding. No occlusion or significant stenosis. CHEST: MEDIASTINUM: No mediastinal lymphadenopathy. The heart and pericardium demonstrate no acute abnormality. LUNGS AND PLEURA: 5.1 x 3.0 cm rounded mass is noted laterally in the right lung base concerning for malignancy. Calcified pleural plaques are noted bilaterally. Minimal bibasilar subsegmental atelectasis is noted. No focal consolidation or pulmonary edema. No evidence of pleural effusion or pneumothorax. THORACIC BONES AND SOFT TISSUES: Malignant destruction of posterior portion of right seventh rib is noted consistent with metastatic disease. Small hiatal hernia. ABDOMEN AND PELVIS: LIVER: The liver is unremarkable. GALLBLADDER AND BILE DUCTS: Status post cholecystectomy. No biliary ductal dilatation. SPLEEN: The spleen is unremarkable. PANCREAS: The pancreas is unremarkable. ADRENAL GLANDS: Bilateral adrenal glands demonstrate no acute abnormality. KIDNEYS, URETERS AND BLADDER: Mild urinary bladder distention. No stones in the kidneys or ureters. No hydronephrosis. No perinephric or periureteral stranding. GI AND BOWEL: Stomach and duodenal sweep demonstrate no acute abnormality. There  is no bowel obstruction. No abnormal bowel wall thickening or distension. REPRODUCTIVE: Reproductive organs are unremarkable. PERITONEUM AND RETROPERITONEUM: No ascites or free air. LYMPH NODES: No lymphadenopathy. ABDOMINAL BONES AND SOFT TISSUES: Possible acute to subacute mild compression deformity of L1 vertebral body is noted. No acute soft tissue abnormality. IMPRESSION: 1. Rounded right lung base mass concerning for malignancy , with osseous metastasis involving the posterior right seventh rib. 2. Possible acute to subacute mild compression deformity of L1 vertebral body is noted. 3. Small hiatal hernia. 4. Status post cholecystectomy. Electronically signed by: Lynwood Seip MD 07/15/2024 11:09 AM EST RP Workstation: [id number]    Labs:  CBC: Recent Labs    07/15/24 0812 07/18/24 1710 07/19/24 0552 07/25/24 1301  WBC 21.2* 22.1* 20.7* 19.6 Repeated and verified X2.*  HGB 12.2* 11.6* 10.2* 11.0*  HCT 35.6* 34.7* 29.5* 32.7*  PLT 202 162 149* 232.0    COAGS: Recent Labs    07/19/24 0121  INR 1.2  APTT 51*    BMP: Recent Labs    07/18/24 2229 07/19/24 0552 07/19/24 1358 07/20/24 0509 07/25/24 1301  NA 128* 126* 129* 127* 129*  K 3.5 3.6 4.0 3.9 3.9  CL 96* 92* 94* 94* 95*  CO2 22 23 25 22 25   GLUCOSE 167* 109* 113* 88 93  BUN 15 16 20 17 15   CALCIUM  7.7* 8.2* 8.4* 8.1* 8.4  CREATININE 0.58* 0.56* 0.64 0.50* 0.57  GFRNONAA >60 >60 >60 >60  --     LIVER FUNCTION TESTS: Recent Labs    03/25/24 0801 07/15/24 0812 07/18/24 1710 07/25/24 1301  BILITOT 0.8 1.2 1.1 0.7  AST 23 38 43* 44*  ALT 18 26 23 27   ALKPHOS 65 161* 160* 166*  PROT 6.9 6.8 7.0 6.0  ALBUMIN 4.1 3.1* 3.0* 3.0*    TUMOR MARKERS: No results for input(s): AFPTM, CEA, CA199, CHROMGRNA in the last 8760 hours.  Assessment and Plan:  89 y.o. male inpatient. History of CAD, HLD, HTN, MI, Found to have a lung mass while being worked up for back pain, hypotension and dizziness. PET scan  from 11.14.25 reads here are additional lesions within the right scapula, sternum, multiple ribs, multiple thoracolumbar vertebral bodies, throughout the bony pelvis and in the proximal left femur. Team is requesting right posterior 7th rib mass biopsy for further evaluation  os metastatic lung cancer.  Case approved by IR Attending Dr. Wilkie Lent.  After review of imagining by  IR Attending Dr. Juliene Balder at [facility] recommends iliac bone lesion. Patient is agreeable to change of plan   PLAN: IR Image Guided Iliac Bone Lesion Biopsy   Risks and benefits of iliac bone lesion biopsy  was discussed with the patient and/or patient's family including, but not limited to bleeding, infection, damage to adjacent structures or low yield requiring additional tests.  All of the questions were answered and there is agreement to proceed.  Consent signed and in chart.   Thank you for this interesting consult.  I greatly enjoyed meeting AIMEE TIMMONS and look forward to participating in their care.  A copy of this report was sent to the requesting provider on this date.  Electronically Signed: Delon JAYSON Beagle, NP 07/27/2024, 11:04 AM   I spent a total of  30 Minutes   in face to face in clinical consultation, greater than 50% of which was counseling/coordinating care for iliac bone lesion biopsy

## 2024-07-26 NOTE — Progress Notes (Signed)
 Patient for CT guided RT Posterior 7th rib mass biopsy on Wed 07/27/24, I called and spoke with the patient's daughter, Barry Small on the phone and gave pre-procedure instructions. Barry Small was made aware to be here at 9a, last dose of ASA 81mg  was NPO after MN prior to procedure as well as driver post procedure/recovery/discharge. Barry Small stated understanding. Called 07/26/24

## 2024-07-27 ENCOUNTER — Other Ambulatory Visit: Payer: Self-pay

## 2024-07-27 ENCOUNTER — Ambulatory Visit
Admission: RE | Admit: 2024-07-27 | Discharge: 2024-07-27 | Disposition: A | Discharge status: Home / Self Care | Source: Ambulatory Visit | Attending: Oncology | Admitting: Oncology

## 2024-07-27 DIAGNOSIS — Z885 Allergy status to narcotic agent status: Secondary | ICD-10-CM | POA: Diagnosis not present

## 2024-07-27 DIAGNOSIS — I10 Essential (primary) hypertension: Secondary | ICD-10-CM | POA: Insufficient documentation

## 2024-07-27 DIAGNOSIS — I252 Old myocardial infarction: Secondary | ICD-10-CM | POA: Diagnosis not present

## 2024-07-27 DIAGNOSIS — C7951 Secondary malignant neoplasm of bone: Secondary | ICD-10-CM | POA: Diagnosis not present

## 2024-07-27 DIAGNOSIS — I2510 Atherosclerotic heart disease of native coronary artery without angina pectoris: Secondary | ICD-10-CM | POA: Diagnosis not present

## 2024-07-27 DIAGNOSIS — Z7982 Long term (current) use of aspirin: Secondary | ICD-10-CM | POA: Insufficient documentation

## 2024-07-27 DIAGNOSIS — E785 Hyperlipidemia, unspecified: Secondary | ICD-10-CM | POA: Diagnosis not present

## 2024-07-27 DIAGNOSIS — M899 Disorder of bone, unspecified: Secondary | ICD-10-CM | POA: Diagnosis not present

## 2024-07-27 DIAGNOSIS — R918 Other nonspecific abnormal finding of lung field: Secondary | ICD-10-CM | POA: Diagnosis not present

## 2024-07-27 DIAGNOSIS — C801 Malignant (primary) neoplasm, unspecified: Secondary | ICD-10-CM | POA: Diagnosis not present

## 2024-07-27 DIAGNOSIS — M898X8 Other specified disorders of bone, other site: Secondary | ICD-10-CM | POA: Diagnosis not present

## 2024-07-27 MED ORDER — SODIUM CHLORIDE 0.9 % IV SOLN: INTRAVENOUS | Status: DC

## 2024-07-27 MED ORDER — FENTANYL CITRATE (PF) 100 MCG/2ML IJ SOLN
INTRAMUSCULAR | Status: AC | PRN
  Administered 2024-07-27: 25 ug via INTRAVENOUS

## 2024-07-27 MED ORDER — FENTANYL CITRATE (PF) 50 MCG/ML IJ SOSY
25.0000 ug | PREFILLED_SYRINGE | Freq: Once | INTRAMUSCULAR | Status: AC
  Administered 2024-07-27: 25 ug via INTRAVENOUS
  Filled 2024-07-27: qty 0.5

## 2024-07-27 MED ORDER — MIDAZOLAM HCL 2 MG/2ML IJ SOLN
INTRAMUSCULAR | Status: AC
  Filled 2024-07-27: qty 2

## 2024-07-27 MED ORDER — FENTANYL CITRATE (PF) 100 MCG/2ML IJ SOLN
INTRAMUSCULAR | Status: AC
  Filled 2024-07-27: qty 2

## 2024-07-27 MED ORDER — MIDAZOLAM HCL (PF) 2 MG/2ML IJ SOLN
INTRAMUSCULAR | Status: AC | PRN
  Administered 2024-07-27: 1 mg via INTRAVENOUS

## 2024-07-27 MED ORDER — LIDOCAINE HCL (PF) 1 % IJ SOLN
10.0000 mL | Freq: Once | INTRAMUSCULAR | Status: AC
  Administered 2024-07-27: 10 mL via INTRADERMAL
  Filled 2024-07-27: qty 10

## 2024-07-27 NOTE — Procedures (Signed)
 Interventional Radiology Procedure:   Indications: Right lung mass and bone lesions  Procedure: CT guided right iliac bone lesion biopsy  Findings: Multiple core biopsies from right iliac lytic bone lesion.   Complications: None     EBL: Minimal  Plan:  Bedrest 1 hours   Issacc Merlo R. Philip, MD  Pager: [phone]

## 2024-07-27 NOTE — Telephone Encounter (Signed)
 3rd attempt to reach patient and left a detailed message, request will be sent back to surgeon's office.

## 2024-07-27 NOTE — Progress Notes (Signed)
 Discharge instructions given to wife and patient with questions answered. Dr Perla consulted regarding rhythm pre procedure/? Afib, looked at EKG, appearing to be sinus with frequent ectopic beats. Explained this to patient and wife. Ok for discharge. Denies complaints post procedure.

## 2024-07-27 NOTE — Assessment & Plan Note (Signed)
 With concern for metastatic disease.  Discussed rationale for biopsy. I sent a note to Dr. Jacobo about getting the biopsy set up.  I appreciate the help of all involved.

## 2024-07-27 NOTE — Progress Notes (Signed)
 EKG reviewed by Cardiology that read it as normal sinus with ectopy. Patient will be discharged home post procedure.

## 2024-07-27 NOTE — Progress Notes (Signed)
 Patient clinically stable post CT bone biopsy per Dr Philip, tolerated well. Vitals stable post procedure. Received Versed 1 mg along with Fentanyl 25 mcg IV for procedure, as well as fentanyl 25 mcg IV given pre procedure per MD request for hip pain, awake/alert and oriented post procedure.

## 2024-07-27 NOTE — Assessment & Plan Note (Signed)
 Discussed options.  I would take 2 tylenol  3 times a day.  Then take oxycodone  if needed on top of that.  Sedation caution discussed.

## 2024-07-27 NOTE — Assessment & Plan Note (Signed)
 Likely multifactorial.  Continue salt tablets for now.  See notes on labs.

## 2024-07-28 ENCOUNTER — Ambulatory Visit: Payer: Self-pay | Admitting: Family Medicine

## 2024-08-01 LAB — SURGICAL PATHOLOGY

## 2024-08-02 ENCOUNTER — Ambulatory Visit: Admitting: Family Medicine

## 2024-08-02 ENCOUNTER — Telehealth: Payer: Self-pay

## 2024-08-02 MED ORDER — BISACODYL 10 MG RE SUPP: 10.0000 mg | RECTAL | 0 refills | Status: DC | PRN

## 2024-08-02 NOTE — Telephone Encounter (Signed)
 Copied from CRM #[id number]. Topic: Clinical - Medication Question >> Aug 02, 2024  8:36 AM Aleatha C wrote: Reason for CRM: Patient is having bad hemorrhoids and is too weak to come due to him being diagnosed with cancer, please call his wife [phone] and let her know if something can be called in to help him have a bowel movement

## 2024-08-02 NOTE — Telephone Encounter (Signed)
 Spoke with pt's wife, Sharman (on dpr), relaying Dr Elfredia message. Recommended pt be seen, either OV or UC, if sxs worsen. She verbalizes understanding and expresses her thanks for call back.

## 2024-08-02 NOTE — Telephone Encounter (Signed)
 I would try using dulcolax suppository.  That should help.  Thanks.

## 2024-08-02 NOTE — Telephone Encounter (Signed)
 I spoke with pts wife (DPR signed) starting about 2 wks ago pt having constipation and difficulty having BM.no abd pain, no fever and no urinary symptoms. Pt having small constipated hard stools and loose stool that leaks out of rectum. Pt not sure when had last normal BM. Pt feels like internal hemorroid due to pain and swelling inside the rectum. Pt was passing a lot of gas and now only pass small amt of gas. Pt taking OTC stool softener and laxative and used fleet enema but did not help much. Pt does not feel like coming for appt and wants DrDuncan to suggest med to take . [facility] and pt wife request cb today. Sending note to Dr Cleatus, Cleatus team and will teams LIsa CMA.

## 2024-08-03 ENCOUNTER — Encounter: Payer: Self-pay | Admitting: Oncology

## 2024-08-03 ENCOUNTER — Inpatient Hospital Stay

## 2024-08-03 ENCOUNTER — Other Ambulatory Visit: Payer: Self-pay | Admitting: *Deleted

## 2024-08-03 ENCOUNTER — Encounter: Payer: Self-pay | Admitting: *Deleted

## 2024-08-03 ENCOUNTER — Inpatient Hospital Stay: Attending: Oncology | Admitting: Oncology

## 2024-08-03 VITALS — BP 138/74 | HR 95 | Temp 97.9°F | Resp 18 | Ht 65.25 in | Wt 158.0 lb

## 2024-08-03 DIAGNOSIS — C787 Secondary malignant neoplasm of liver and intrahepatic bile duct: Secondary | ICD-10-CM | POA: Insufficient documentation

## 2024-08-03 DIAGNOSIS — C7951 Secondary malignant neoplasm of bone: Secondary | ICD-10-CM | POA: Diagnosis not present

## 2024-08-03 DIAGNOSIS — C3491 Malignant neoplasm of unspecified part of right bronchus or lung: Secondary | ICD-10-CM | POA: Diagnosis not present

## 2024-08-03 DIAGNOSIS — G893 Neoplasm related pain (acute) (chronic): Secondary | ICD-10-CM | POA: Insufficient documentation

## 2024-08-03 DIAGNOSIS — C3490 Malignant neoplasm of unspecified part of unspecified bronchus or lung: Secondary | ICD-10-CM

## 2024-08-03 LAB — MISCELLANEOUS TEST

## 2024-08-03 MED ORDER — HYDROCODONE-ACETAMINOPHEN 5-325 MG PO TABS: 1.0000 | ORAL_TABLET | ORAL | 0 refills | Status: DC | PRN

## 2024-08-03 NOTE — Progress Notes (Signed)
 Patient went to the hospital due to having severe shoulder and lower back pain. He has painful hemorrhoids, which he is taking dulcolax prescribed by his PCP, but he doesn't have constipation. He is wanting to know if there is something else that would help with the pain. He doesn't have an appetite, he does drink ensure.

## 2024-08-03 NOTE — Progress Notes (Signed)
 Met with patient and his wife during follow up visit with Dr. Jacobo. All questions answered during visit. Reviewed upcoming appts. Informed that will be called with appt for brain MRI and port placement. Pt given cancer resource guide and contact info. Instructed to call with any questions or needs. Pt and wife verbalized understanding.

## 2024-08-03 NOTE — Progress Notes (Signed)
 Order placed for [employer] xT, xR, PDL1, and xF+. Financial assistance completed and approved for $0 out of pocket cost.

## 2024-08-05 ENCOUNTER — Encounter: Payer: Self-pay | Admitting: Oncology

## 2024-08-05 DIAGNOSIS — C3491 Malignant neoplasm of unspecified part of right bronchus or lung: Secondary | ICD-10-CM | POA: Insufficient documentation

## 2024-08-05 MED ORDER — ONDANSETRON HCL 8 MG PO TABS: 8.0000 mg | ORAL_TABLET | Freq: Three times a day (TID) | ORAL | 2 refills | Status: DC | PRN

## 2024-08-05 MED ORDER — PROCHLORPERAZINE MALEATE 10 MG PO TABS: 10.0000 mg | ORAL_TABLET | Freq: Four times a day (QID) | ORAL | 2 refills | Status: DC | PRN

## 2024-08-05 MED ORDER — LIDOCAINE-PRILOCAINE 2.5-2.5 % EX CREA: TOPICAL_CREAM | CUTANEOUS | 3 refills | Status: DC

## 2024-08-05 NOTE — Progress Notes (Signed)
 [facility]  [phone] [phone]  ID: Barry Small OB: November 17, 1933  MR#: [id number]  RDW#:[id number]  Patient Care Team: [name], MD as PCP - General Kennyth Chew, MD as PCP - Electrophysiology (Cardiology) Perla Evalene PARAS, MD as Consulting Physician (Cardiology) Fate Morna SAILOR, [facility] (Inactive) as Pharmacist (Pharmacist) [place], [facility] [place], [place], [place] as Oncology Nurse Navigator  CHIEF COMPLAINT: Stage IVb squamous cell carcinoma right lung.  INTERVAL HISTORY: Patient returns to clinic today for further evaluation, discussion of his pathology results, and treatment planning.  He continues to have pain, but otherwise feels well.  He has no neurologic complaints.  He denies any recent fevers or illnesses.  He has a good appetite and denies weight loss.  He has no chest pain, shortness of breath, cough, or hemoptysis.  He denies any nausea, vomiting, constipation, or diarrhea.  He has no urinary complaints.  Patient offers no further specific complaints today.  REVIEW OF SYSTEMS:   Review of Systems  Constitutional: Negative.  Negative for fever, malaise/fatigue and weight loss.  Respiratory: Negative.  Negative for cough, hemoptysis and shortness of breath.   Cardiovascular:  Negative for chest pain and leg swelling.  Gastrointestinal: Negative.  Negative for abdominal pain, blood in stool and melena.  Genitourinary: Negative.  Negative for dysuria.  Musculoskeletal:  Positive for joint pain.  Skin: Negative.  Negative for rash.  Neurological: Negative.  Negative for dizziness, focal weakness, weakness and headaches.  Psychiatric/Behavioral: Negative.  The patient is not nervous/anxious.     As per HPI. Otherwise, a complete review of systems is negative.  PAST MEDICAL HISTORY: Past Medical History:  Diagnosis Date   Barrett's esophagus    Colon polyps 04/2003   type unknown    Coronary artery disease     Elevated bilirubin    Elevated glucose    GERD (gastroesophageal reflux disease)    Hyperlipidemia    Hypertension    Myocardial infarction [facility])    Pneumonia 2014    PAST SURGICAL HISTORY: Past Surgical History:  Procedure Laterality Date   BELPHAROPTOSIS REPAIR  08/2018   Cardiolite  12/2001   h/o vessel infarct EF 66%   CATARACT EXTRACTION W/ INTRAOCULAR LENS  IMPLANT, BILATERAL  09/2018   CHOLECYSTECTOMY  09/2001   COLONOSCOPY  11/15/2008   Mild divertics, 5 yrs, Dr. Aneita   COLONOSCOPY W/ POLYPECTOMY  05/09/2003   x multiple, Dr. Viktoria   CORONARY ANGIOPLASTY  2001   x 2, Dr.Pulsipher   CORONARY ANGIOPLASTY WITH STENT PLACEMENT  12/05/2005   CORONARY ARTERY BYPASS GRAFT  1989   [facility]   ESOPHAGOGASTRODUODENOSCOPY  05/09/2003   Barrett's   ESOPHAGOGASTRODUODENOSCOPY  11/17/08   Barrett's Esoph [facility] (Dr. Aneita)  2 years   EYE SURGERY  09/2018   cataract extraction   [facility]  03/28-03/31/2007   Chest pain   NASAL SINUS SURGERY  06/23/2008   Dr.Clark   NOSE SURGERY  06/23/2008   Dr. Gretta   Stress Myoview   09/30/2004   Normal EF 70%   Stress Myoview   02/11/2006   No change, c/w 09/2004   Stress Myoview   02/22/2008   Abnormal w/prior inf infarct  No signif ischemia    FAMILY HISTORY: Family History  Problem Relation Age of Onset   Heart disease Father    Pneumonia Mother    Colon cancer Neg Hx    Prostate cancer Neg Hx     ADVANCED DIRECTIVES (Y/N):  N  HEALTH MAINTENANCE: Social History   Tobacco Use   Smoking status: Never   Smokeless tobacco: Never  Vaping Use   Vaping status: Never Used  Substance Use Topics   Alcohol use: Yes    Comment: occassional beer-socially   Drug use: No     Colonoscopy:  PAP:  Bone density:  Lipid panel:  Allergies  Allergen Reactions   Atorvastatin Other (See Comments)    REACTION: myalgia   Morphine  Other (See Comments)    REACTION: syncope- lower BP but may be able to tolerate low dose of med.     Simvastatin Other (See Comments)    REACTION: ? reaction    Current Outpatient Medications  Medication Sig Dispense Refill   acetaminophen  (TYLENOL ) 500 MG tablet Take 2 tablets (1,000 mg total) by mouth every 8 (eight) hours as needed.     aspirin  81 MG EC tablet Take 81 mg by mouth daily.     bisacodyl  (DULCOLAX) 10 MG suppository Place 1 suppository (10 mg total) rectally as needed for moderate constipation. 12 suppository 0   Cholecalciferol  (VITAMIN D3) 2000 units TABS Take by mouth daily.     metoprolol  tartrate (LOPRESSOR ) 25 MG tablet Take 1 tablet (25 mg total) by mouth 2 (two) times daily. 30 tablet 30   Multiple Vitamins-Minerals (MULTIVITAMIN) tablet Take 1 tablet by mouth daily.     nitroGLYCERIN  (NITROSTAT ) 0.4 MG SL tablet DISSOLVE ONE TABLET UNDER TONGUE EVERY 5 MINUTES AS NEEDED FOR CHEST PAIN 25 tablet 1   omeprazole  (PRILOSEC) 20 MG capsule Take 1 capsule (20 mg total) by mouth every Monday, Wednesday, and Friday. 45 capsule 3   rosuvastatin  (CRESTOR ) 20 MG tablet Take 1 tablet (20 mg total) by mouth daily. 90 tablet 3   HYDROcodone -acetaminophen  (NORCO/VICODIN) 5-325 MG tablet Take 1 tablet by mouth every 4 (four) hours as needed for moderate pain (pain score 4-6). 30 tablet 0   sodium chloride  1 g tablet Take 1 tablet (1 g total) by mouth 2 (two) times daily with a meal. (Patient not taking: Reported on 08/03/2024) 60 tablet 1   No current facility-administered medications for this visit.    OBJECTIVE: Vitals:   08/03/24 1157  BP: 138/74  Pulse: 95  Resp: 18  Temp: 97.9 F (36.6 C)  SpO2: 95%     Body mass index is 26.09 kg/m.    ECOG FS:1 - Symptomatic but completely ambulatory  General: Well-developed, well-nourished, no acute distress. Eyes: Pink conjunctiva, anicteric sclera. HEENT: Normocephalic, moist mucous membranes. Lungs: No audible wheezing or coughing. Heart: Regular rate and rhythm. Abdomen: Soft, nontender, no obvious  distention. Musculoskeletal: No edema, cyanosis, or clubbing. Neuro: Alert, answering all questions appropriately. Cranial nerves grossly intact. Skin: No rashes or petechiae noted. Psych: Normal affect. Lymphatics: No cervical, calvicular, axillary or inguinal LAD.   LAB RESULTS:  Lab Results  Component Value Date   NA 129 (L) 07/25/2024   K 3.9 07/25/2024   CL 95 (L) 07/25/2024   CO2 25 07/25/2024   GLUCOSE 93 07/25/2024   BUN 15 07/25/2024   CREATININE 0.57 07/25/2024   CALCIUM  8.4 07/25/2024   PROT 6.0 07/25/2024   ALBUMIN 3.0 (L) 07/25/2024   AST 44 (H) 07/25/2024   ALT 27 07/25/2024   ALKPHOS 166 (H) 07/25/2024   BILITOT 0.7 07/25/2024   GFRNONAA >60 07/20/2024   GFRAA 143 08/30/2008    Lab Results  Component Value Date   WBC 19.6 Repeated and verified X2. [facility]) 07/25/2024  NEUTROABS 16.3 (H) 07/25/2024   HGB 11.0 (L) 07/25/2024   HCT 32.7 (L) 07/25/2024   MCV 86.8 07/25/2024   PLT 232.0 07/25/2024     STUDIES: CT BONE TROCAR/NEEDLE BIOPSY SUPERFICIAL Result Date: 07/27/2024 INDICATION: 89 year old with right lung mass and lytic bone lesions. Tissue diagnosis is needed. EXAM: CT-guided core biopsy of right iliac lytic bone lesion TECHNIQUE: Multidetector CT imaging of the pelvis was performed following the standard protocol without IV contrast. RADIATION DOSE REDUCTION: This exam was performed according to the departmental dose-optimization program which includes automated exposure control, adjustment of the mA and/or kV according to patient size and/or use of iterative reconstruction technique. MEDICATIONS: Moderate sedation ANESTHESIA/SEDATION: Moderate (conscious) sedation was employed during this procedure. A total of Versed  1 mg and Fentanyl  25 mcg was administered intravenously by the radiology nurse. Total intra-service moderate Sedation Time: 10 minutes. The patient's level of consciousness and vital signs were monitored continuously by radiology nursing  throughout the procedure under my direct supervision. COMPLICATIONS: None immediate. PROCEDURE: Informed written consent was obtained from the patient after a thorough discussion of the procedural risks, benefits and alternatives. All questions were addressed. A timeout was performed prior to the initiation of the procedure. Patient was placed prone. CT images through the pelvis were obtained. Large lytic bone lesion involving the right iliac wing was identified and targeted. Posterior lower back was prepped with chlorhexidine and sterile field was created. Skin was anesthetized using 1% lidocaine . Small incision was made. Using CT guidance, 17 gauge coaxial needle was directed into the lytic bone lesion. Multiple core biopsies were obtained with an 18 gauge core device. Specimens placed in formalin. 17 gauge needle was removed. Follow up CT images were obtained. Bandage placed over the puncture site. FINDINGS: Large destructive soft tissue mass involving the right iliac wing. Biopsy needle confirmed within the soft tissue mass. IMPRESSION: CT-guided core biopsy of the large lytic lesion involving the right iliac bone. Electronically Signed   By: Juliene Balder M.D.   On: 07/27/2024 12:50   NM PET Image Initial (PI) Skull Base To Thigh Result Date: 07/22/2024 CLINICAL DATA:  Initial treatment strategy for right lung mass with clinical concern for bronchogenic carcinoma and rib metastasis. EXAM: NUCLEAR MEDICINE PET SKULL BASE TO THIGH TECHNIQUE: 8.69 mCi F-18 FDG was injected intravenously. Full-ring PET imaging was performed from the skull base to thigh after the radiotracer. CT data was obtained and used for attenuation correction and anatomic localization. Fasting blood glucose: 108 mg/dl COMPARISON:  CT of the chest, abdomen and pelvis 07/15/2024. FINDINGS: Mediastinal blood pool activity: SUV max 1.9 NECK: No hypermetabolic cervical lymph nodes are identified. No suspicious activity identified within the  pharyngeal mucosal space. Incidental CT findings: Bilateral carotid atherosclerosis. CHEST: There are no hypermetabolic mediastinal, hilar or axillary lymph nodes. Previously demonstrated mass laterally in the right lower lobe is hypermetabolic. This lesion measures 5.9 x 4.2 cm on image 62/6 and has an SUV max of 9.7. No other hypermetabolic pulmonary activity or suspicious nodularity. There is no hypermetabolic activity associated with a small right pleural effusion. Incidental CT findings: Underlying calcified pleural plaque formation bilaterally with small bilateral pleural effusions. Pleural fluid extends into the right major fissure. Underlying chronic obstructive lung disease with diffuse central airway thickening and bibasilar atelectasis. Diffuse atherosclerosis of the aorta, great vessels and coronary arteries post median sternotomy and CABG. ABDOMEN/PELVIS: Hypermetabolic activity centrally in the right hepatic lobe suspicious for metastatic disease (SUV max 5.2). Corresponding subtle hypodense lesion measuring  2.3 cm on image 65/6. Possible additional smaller metastasis more medially near the caudate lobe. There is focal hypermetabolic activity within the left adrenal gland (SUV max 4.5) without corresponding measurable mass on the CT images. No hypermetabolic activity within the right adrenal gland, pancreas, spleen or bowel. There is no hypermetabolic nodal activity in the abdomen or pelvis. Incidental CT findings: Status post cholecystectomy. Aortic and branch vessel atherosclerosis. Mild diverticular changes within the distal colon. SKELETON: Widespread hypermetabolic osseous metastatic disease. There is a large lytic lesion in the right iliac crest measuring 5.3 x 4.3 cm on image 97/6 with an SUV max of 13.3. There are additional lesions within the right scapula, sternum, multiple ribs, multiple thoracolumbar vertebral bodies, throughout the bony pelvis and in the proximal left femur. As seen on  prior CT, fracture of the L1 vertebral body, potentially pathologic. Given the extent of spinal involvement, patient at risk for additional pathologic fractures. Incidental CT findings: Multilevel spondylosis. Healed median sternotomy. IMPRESSION: 1. Hypermetabolic right lower lobe pulmonary mass consistent with primary bronchogenic carcinoma. 2. Widespread hypermetabolic osseous metastatic disease with possible L1 pathologic fracture. There is a large lytic lesion in the right iliac crest. Given the extent of spinal involvement, patient at risk for additional pathologic fractures. 3. Hypermetabolic activity centrally in the right hepatic lobe suspicious for metastatic disease. 4. Focal hypermetabolic activity within the left adrenal gland suspicious for metastatic disease. 5. Small bilateral pleural effusions without hypermetabolic activity. 6.  Aortic Atherosclerosis (ICD10-I70.0). Electronically Signed   By: Barry Perone M.D.   On: 07/22/2024 14:22   DG Chest Portable 1 View Result Date: 07/18/2024 EXAM: 1 AP VIEW XRAY OF THE CHEST 07/18/2024 05:42:00 PM COMPARISON: None available. CLINICAL HISTORY: SOB. FINDINGS: LUNGS AND PLEURA: Hypoinflation. Patchy opacities in lung bases, atelectasis versus pneumonia. Calcified left pleural plaque. Pulmonary vascular congestion. No pulmonary edema. No pleural effusion. No pneumothorax. HEART AND MEDIASTINUM: Cardiomegaly. Aortic arch atherosclerosis. CABG markers noted. BONES AND SOFT TISSUES: Sternotomy wires noted. No acute osseous abnormality. IMPRESSION: 1. Patchy bibasilar opacities, atelectasis versus pneumonia. 2. Cardiomegaly with pulmonary vascular congestion. 3. Calcified left pleural plaque. Electronically signed by: Norman Gatlin MD 07/18/2024 06:21 PM EST RP Workstation: [id number]   CT Angio Chest/Abd/Pel for Dissection W and/or Wo Contrast Result Date: 07/15/2024 EXAM: CTA CHEST, ABDOMEN AND PELVIS WITH AND WITHOUT CONTRAST 07/15/2024 10:32:48 AM  TECHNIQUE: CTA of the chest was performed with and without the administration of intravenous contrast. CTA of the abdomen and pelvis was performed with and without the administration of intravenous contrast. Multiplanar reformatted images are provided for review. MIP images are provided for review. Automated exposure control, iterative reconstruction, and/or weight based adjustment of the mA/kV was utilized to reduce the radiation dose to as low as reasonably achievable. 100 mL of iohexol  (OMNIPAQUE ) 350 MG/ML injection was administered. COMPARISON: 10/26/2020 status post coronary artery bypass graft with dissection is noted. CLINICAL HISTORY: acute back pain, hypertension, leukocytosis 21k FINDINGS: VASCULATURE: AORTA: No acute finding. No abdominal aortic aneurysm. No dissection. PULMONARY ARTERIES: No pulmonary embolism with the limits of this exam. GREAT VESSELS OF AORTIC ARCH: No acute finding. No dissection. No arterial occlusion or significant stenosis. CELIAC TRUNK: No acute finding. No occlusion or significant stenosis. SUPERIOR MESENTERIC ARTERY: Severe narrowing at origin of superior mesenteric artery secondary to calcified plaque. INFERIOR MESENTERIC ARTERY: No acute finding. No occlusion or significant stenosis. RENAL ARTERIES: No acute finding. No occlusion or significant stenosis. ILIAC ARTERIES: No acute finding. No occlusion or significant stenosis. CHEST:  MEDIASTINUM: No mediastinal lymphadenopathy. The heart and pericardium demonstrate no acute abnormality. LUNGS AND PLEURA: 5.1 x 3.0 cm rounded mass is noted laterally in the right lung base concerning for malignancy. Calcified pleural plaques are noted bilaterally. Minimal bibasilar subsegmental atelectasis is noted. No focal consolidation or pulmonary edema. No evidence of pleural effusion or pneumothorax. THORACIC BONES AND SOFT TISSUES: Malignant destruction of posterior portion of right seventh rib is noted consistent with metastatic disease.  Small hiatal hernia. ABDOMEN AND PELVIS: LIVER: The liver is unremarkable. GALLBLADDER AND BILE DUCTS: Status post cholecystectomy. No biliary ductal dilatation. SPLEEN: The spleen is unremarkable. PANCREAS: The pancreas is unremarkable. ADRENAL GLANDS: Bilateral adrenal glands demonstrate no acute abnormality. KIDNEYS, URETERS AND BLADDER: Mild urinary bladder distention. No stones in the kidneys or ureters. No hydronephrosis. No perinephric or periureteral stranding. GI AND BOWEL: Stomach and duodenal sweep demonstrate no acute abnormality. There is no bowel obstruction. No abnormal bowel wall thickening or distension. REPRODUCTIVE: Reproductive organs are unremarkable. PERITONEUM AND RETROPERITONEUM: No ascites or free air. LYMPH NODES: No lymphadenopathy. ABDOMINAL BONES AND SOFT TISSUES: Possible acute to subacute mild compression deformity of L1 vertebral body is noted. No acute soft tissue abnormality. IMPRESSION: 1. Rounded right lung base mass concerning for malignancy , with osseous metastasis involving the posterior right seventh rib. 2. Possible acute to subacute mild compression deformity of L1 vertebral body is noted. 3. Small hiatal hernia. 4. Status post cholecystectomy. Electronically signed by: Lynwood Seip MD 07/15/2024 11:09 AM EST RP Workstation: [id number]    ASSESSMENT: Stage IVb squamous cell carcinoma right lung.  PLAN:    Stage IVb squamous cell carcinoma right lung: PET scan results from July 15, 2024 reviewed independently and report as above confirming widespread metastatic disease in liver and bone.  Biopsy completed on July 27, 2024 confirmed diagnosis.  MRI of the brain has been ordered to complete the staging workup.  Patient has expressed interest in chemotherapy, therefore will proceed cautiously with carboplatin and Taxol with G-CSF support every 3 weeks.  Molecular studies are pending at time of dictation.  We also briefly discussed hospice and end-of-life, but  patient was not interested in pursuing this at this time.  Patient will require port placement prior to initiating treatment.  Return to clinic on August 16, 2024 to initiate cycle 1 of treatment. Bony metastasis: Patient will receive Zometa on odd-numbered cycles. Pain: Patient was given a referral to radiation oncology.  He was also given a prescription for Vicodin today.  Patient expressed understanding and was in agreement with this plan. He also understands that He can call clinic at any time with any questions, concerns, or complaints.    Cancer Staging  Squamous cell carcinoma lung, right [facility]) Staging form: Lung, AJCC V9 - Clinical stage from 08/05/2024: Stage IVB (cT3, cN0, cM1c2) - Signed by Jacobo Evalene PARAS, MD on 08/05/2024   Evalene PARAS Jacobo, MD   08/05/2024 7:40 AM

## 2024-08-05 NOTE — Progress Notes (Signed)
 START ON PATHWAY REGIMEN - Non-Small Cell Lung     A cycle is every 21 days:     Paclitaxel      Carboplatin   **Always confirm dose/schedule in your pharmacy ordering system**  Patient Characteristics: Stage IV Metastatic, Squamous, Awaiting Molecular Test Results and Need to Start Chemotherapy, PS = 0 - 2 Therapeutic Status: Stage IV Metastatic Histology: Squamous Cell ECOG Performance Status: 1 Intent of Therapy: Non-Curative / Palliative Intent, Discussed with Patient

## 2024-08-06 ENCOUNTER — Other Ambulatory Visit: Payer: Self-pay

## 2024-08-08 ENCOUNTER — Telehealth: Payer: Self-pay | Admitting: *Deleted

## 2024-08-08 NOTE — Telephone Encounter (Signed)
 RN called and spoke with patients family regarding appointment for port placement. Patient is scheduled for port placement on Monday 12/8. Patient to arrive at [facility] at 8:30 am for 9:30 am procedure. Patient will need driver to and from procedure and will not be able to eat or drink after midnight prior. Patients family verbalized understanding and is in agreement with plan.

## 2024-08-09 ENCOUNTER — Inpatient Hospital Stay

## 2024-08-09 ENCOUNTER — Ambulatory Visit

## 2024-08-09 DIAGNOSIS — C3490 Malignant neoplasm of unspecified part of unspecified bronchus or lung: Secondary | ICD-10-CM

## 2024-08-09 NOTE — Progress Notes (Signed)
 Pharmacist Chemotherapy Monitoring - Initial Assessment    Anticipated start date: 08/17/24   The following has been reviewed per standard work regarding the patient's treatment regimen: The patient's diagnosis, treatment plan and drug doses, and organ/hematologic function Lab orders and baseline tests specific to treatment regimen  The treatment plan start date, drug sequencing, and pre-medications Prior authorization status  Patient's documented medication list, including drug-drug interaction screen and prescriptions for anti-emetics and supportive care specific to the treatment regimen The drug concentrations, fluid compatibility, administration routes, and timing of the medications to be used The patient's access for treatment and lifetime cumulative dose history, if applicable  The patient's medication allergies and previous infusion related reactions, if applicable   Changes made to treatment plan:  N/A  Follow up needed:  Tempus results pending   Maudie FORBES Andreas, PharmD, BCPS Clinical Pharmacist   08/09/2024  3:51 PM

## 2024-08-10 ENCOUNTER — Ambulatory Visit
Admission: RE | Admit: 2024-08-10 | Discharge: 2024-08-10 | Disposition: A | Source: Ambulatory Visit | Attending: Radiation Oncology | Admitting: Radiation Oncology

## 2024-08-10 ENCOUNTER — Other Ambulatory Visit: Payer: Self-pay | Admitting: *Deleted

## 2024-08-10 ENCOUNTER — Encounter: Payer: Self-pay | Admitting: *Deleted

## 2024-08-10 ENCOUNTER — Telehealth: Payer: Self-pay

## 2024-08-10 ENCOUNTER — Encounter: Payer: Self-pay | Admitting: Radiation Oncology

## 2024-08-10 VITALS — BP 135/90 | HR 72 | Temp 97.6°F | Resp 20 | Wt 158.0 lb

## 2024-08-10 DIAGNOSIS — C3491 Malignant neoplasm of unspecified part of right bronchus or lung: Secondary | ICD-10-CM

## 2024-08-10 DIAGNOSIS — Z8719 Personal history of other diseases of the digestive system: Secondary | ICD-10-CM | POA: Diagnosis not present

## 2024-08-10 DIAGNOSIS — Z860100 Personal history of colon polyps, unspecified: Secondary | ICD-10-CM | POA: Diagnosis not present

## 2024-08-10 DIAGNOSIS — I252 Old myocardial infarction: Secondary | ICD-10-CM | POA: Diagnosis not present

## 2024-08-10 DIAGNOSIS — C7951 Secondary malignant neoplasm of bone: Secondary | ICD-10-CM | POA: Diagnosis not present

## 2024-08-10 DIAGNOSIS — I10 Essential (primary) hypertension: Secondary | ICD-10-CM | POA: Diagnosis not present

## 2024-08-10 DIAGNOSIS — Z923 Personal history of irradiation: Secondary | ICD-10-CM | POA: Diagnosis not present

## 2024-08-10 DIAGNOSIS — C787 Secondary malignant neoplasm of liver and intrahepatic bile duct: Secondary | ICD-10-CM | POA: Diagnosis not present

## 2024-08-10 DIAGNOSIS — Z79899 Other long term (current) drug therapy: Secondary | ICD-10-CM | POA: Diagnosis not present

## 2024-08-10 DIAGNOSIS — Z8701 Personal history of pneumonia (recurrent): Secondary | ICD-10-CM | POA: Diagnosis not present

## 2024-08-10 DIAGNOSIS — Z7982 Long term (current) use of aspirin: Secondary | ICD-10-CM | POA: Diagnosis not present

## 2024-08-10 DIAGNOSIS — E785 Hyperlipidemia, unspecified: Secondary | ICD-10-CM | POA: Diagnosis not present

## 2024-08-10 DIAGNOSIS — I2510 Atherosclerotic heart disease of native coronary artery without angina pectoris: Secondary | ICD-10-CM | POA: Diagnosis not present

## 2024-08-10 DIAGNOSIS — C3431 Malignant neoplasm of lower lobe, right bronchus or lung: Secondary | ICD-10-CM | POA: Diagnosis not present

## 2024-08-10 MED ORDER — HYDROCODONE-ACETAMINOPHEN 5-325 MG PO TABS: 1.0000 | ORAL_TABLET | ORAL | 0 refills | Status: DC | PRN

## 2024-08-10 NOTE — Telephone Encounter (Signed)
 [facility] Clinical Social Work  Clinical Social Work was referred by statistician for distress screen needs.  Covering Clinical Social Worker attempted to contact patient by phone to offer support and assess for needs.  No Answer. CSW let voicemail with direct contact.    Lizbeth Sprague, LCSW  Clinical Social Worker [facility]

## 2024-08-10 NOTE — Progress Notes (Signed)
 Met with patient and spouse during initial consult with Dr. Lenn. All questions answered during visit. Reviewed upcoming appts. Instructed to call with any questions or needs. Pt and spouse verbalized understanding.

## 2024-08-10 NOTE — Consult Note (Signed)
 NEW PATIENT EVALUATION  Name: Barry Small  MRN: [id number]  Date:   08/10/2024     DOB: [birth year]   This 89 y.o. 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  REFERRING PHYSICIAN: Jacobo Evalene PARAS, MD  CHIEF COMPLAINT:  Chief Complaint  Patient presents with   Lung Cancer    DIAGNOSIS: The encounter diagnosis was Squamous cell carcinoma lung, right (HCC).   PREVIOUS INVESTIGATIONS:  PET scan CT scans reviewed Clinical notes reviewed Pathology reports reviewed  HPI: Patient is an 89 year old male admitted to the hospital for immunity acquired pneumonia.  At which time he was found to have a hypermetabolic right lower lobe pulmonary mass consistent with primary bronchogenic carcinoma.  He also had widespread hypermetabolic osseous metastatic disease with possible L1 pathologic fracture.  There was a large lytic lesion in the right iliac crest.  Patient also had liver metastasis and hypermetabolic activity with the left adrenal gland suspicious for metastatic disease.  His major complaint is lower back and pelvic pain.  He has significant disease in the sacrum.  He is having no focal neurologic deficits.  Patient is planned to have CarboTaxol chemotherapy under Dr. Marcial direction.  He is seen today for consideration of palliative radiation therapy.  PLANNED TREATMENT REGIMEN: Palliative radiation therapy to his pelvis  PAST MEDICAL HISTORY:  has a past medical history of Barrett's esophagus, Colon polyps (04/2003), Coronary artery disease, Elevated bilirubin, Elevated glucose, GERD (gastroesophageal reflux disease), Hyperlipidemia, Hypertension, Myocardial infarction (HCC), and Pneumonia (2014).    PAST SURGICAL HISTORY:  Past Surgical History:  Procedure Laterality Date   BELPHAROPTOSIS REPAIR  08/2018   Cardiolite  12/2001   h/o vessel infarct EF 66%   CATARACT  EXTRACTION W/ INTRAOCULAR LENS  IMPLANT, BILATERAL  09/2018   CHOLECYSTECTOMY  09/2001   COLONOSCOPY  11/15/2008   Mild divertics, 5 yrs, Dr. Aneita   COLONOSCOPY W/ POLYPECTOMY  05/09/2003   x multiple, Dr. Viktoria   CORONARY ANGIOPLASTY  2001   x 2, Dr.Pulsipher   CORONARY ANGIOPLASTY WITH STENT PLACEMENT  12/05/2005   CORONARY ARTERY BYPASS GRAFT  1989   [facility]   ESOPHAGOGASTRODUODENOSCOPY  05/09/2003   Barrett's   ESOPHAGOGASTRODUODENOSCOPY  11/17/08   Barrett's Esoph [facility] (Dr. Aneita)  2 years   EYE SURGERY  09/2018   cataract extraction   [facility]  03/28-03/31/2007   Chest pain   NASAL SINUS SURGERY  06/23/2008   Dr.Clark   NOSE SURGERY  06/23/2008   Dr. Gretta   Stress Myoview   09/30/2004   Normal EF 70%   Stress Myoview   02/11/2006   No change, c/w 09/2004   Stress Myoview   02/22/2008   Abnormal w/prior inf infarct  No signif ischemia    FAMILY HISTORY: family history includes Heart disease in his father; Pneumonia in his mother.  SOCIAL HISTORY:  reports that he has never smoked. He has never used smokeless tobacco. He reports current alcohol use. He reports that he does not use drugs.  ALLERGIES: Atorvastatin, Morphine , and Simvastatin  MEDICATIONS:  Current Outpatient Medications  Medication Sig Dispense Refill   acetaminophen  (TYLENOL ) 500 MG tablet Take 2 tablets (1,000 mg total) by mouth every 8 (eight) hours as needed.     aspirin  81 MG EC tablet Take 81 mg by mouth daily.     bisacodyl  (DULCOLAX) 10 MG suppository Place 1 suppository (10 mg total)  rectally as needed for moderate constipation. 12 suppository 0   Cholecalciferol  (VITAMIN D3) 2000 units TABS Take by mouth daily.     lidocaine -prilocaine (EMLA) cream Apply to affected area once 30 g 3   metoprolol  tartrate (LOPRESSOR ) 25 MG tablet Take 1 tablet (25 mg total) by mouth 2 (two) times daily. 30 tablet 30   Multiple Vitamins-Minerals (MULTIVITAMIN) tablet Take 1 tablet by mouth daily.      nitroGLYCERIN  (NITROSTAT ) 0.4 MG SL tablet DISSOLVE ONE TABLET UNDER TONGUE EVERY 5 MINUTES AS NEEDED FOR CHEST PAIN 25 tablet 1   omeprazole  (PRILOSEC) 20 MG capsule Take 1 capsule (20 mg total) by mouth every Monday, Wednesday, and Friday. 45 capsule 3   ondansetron  (ZOFRAN ) 8 MG tablet Take 1 tablet (8 mg total) by mouth every 8 (eight) hours as needed for nausea or vomiting. Start on the third day after carboplatin. 60 tablet 2   prochlorperazine (COMPAZINE) 10 MG tablet Take 1 tablet (10 mg total) by mouth every 6 (six) hours as needed for nausea or vomiting. 60 tablet 2   rosuvastatin  (CRESTOR ) 20 MG tablet Take 1 tablet (20 mg total) by mouth daily. 90 tablet 3   sodium chloride  1 g tablet Take 1 tablet (1 g total) by mouth 2 (two) times daily with a meal. 60 tablet 1   HYDROcodone -acetaminophen  (NORCO/VICODIN) 5-325 MG tablet Take 1 tablet by mouth every 4 (four) hours as needed for moderate pain (pain score 4-6). 60 tablet 0   No current facility-administered medications for this encounter.    ECOG PERFORMANCE STATUS:  2 - Symptomatic, <50% confined to bed  REVIEW OF SYSTEMS: Patient denies any weight loss, fatigue, weakness, fever, chills or night sweats. Patient denies any loss of vision, blurred vision. Patient denies any ringing  of the ears or hearing loss. No irregular heartbeat. Patient denies heart murmur or history of fainting. Patient denies any chest pain or pain radiating to her upper extremities. Patient denies any shortness of breath, difficulty breathing at night, cough or hemoptysis. Patient denies any swelling in the lower legs. Patient denies any nausea vomiting, vomiting of blood, or coffee ground material in the vomitus. Patient denies any stomach pain. Patient states has had normal bowel movements no significant constipation or diarrhea. Patient denies any dysuria, hematuria or significant nocturia. Patient denies any problems walking, swelling in the joints or loss of  balance. Patient denies any skin changes, loss of hair or loss of weight. Patient denies any excessive worrying or anxiety or significant depression. Patient denies any problems with insomnia. Patient denies excessive thirst, polyuria, polydipsia. Patient denies any swollen glands, patient denies easy bruising or easy bleeding. Patient denies any recent infections, allergies or URI. Patient s visual fields have not changed significantly in recent time.   PHYSICAL EXAM: BP (!) 135/90   Pulse 72   Temp 97.6 F (36.4 C) (Tympanic)   Resp 20   Wt 158 lb (71.7 kg) Comment: stated wt  BMI 26.09 kg/m  Frail elderly male in NAD wheelchair-bound.  He states pain is mostly on the left side of his pelvis.  PET scan does show a large mass in the right iliac crest.  Motor and sensory levels are equal symmetric in lower extremities.  Well-developed well-nourished patient in NAD. HEENT reveals PERLA, EOMI, discs not visualized.  Oral cavity is clear. No oral mucosal lesions are identified. Neck is clear without evidence of cervical or supraclavicular adenopathy. Lungs are clear to A&P. Cardiac examination is essentially  unremarkable with regular rate and rhythm without murmur rub or thrill. Abdomen is benign with no organomegaly or masses noted. Motor sensory and DTR levels are equal and symmetric in the upper and lower extremities. Cranial nerves II through XII are grossly intact. Proprioception is intact. No peripheral adenopathy or edema is identified. No motor or sensory levels are noted. Crude visual fields are within normal range.  LABORATORY DATA: Pathology reports reviewed    RADIOLOGY RESULTS: CT scans PET CT scans reviewed compatible with above-stated findings   IMPRESSION: Widespread metastatic disease consistent with stage IV primary squamous cell carcinoma of the lung in 89 year old male  PLAN: This time elected ahead with palliative radiation therapy will concentrate on his pelvis try to  encompass the right large iliac crest lesion as well as his sacrum in this single field.  Would plan on delivering 30 Gray in 10 fractions.  Will use a hybrid treatment plan to try to spare as much bowel as possible.  Risks and benefits of treatment including possible diarrhea possible fatigue alteration blood counts skin reaction all were reviewed with the patient and his family.  I have personally set up and ordered CT simulation.  I would like to take this opportunity to thank you for allowing me to participate in the care of your patient.SABRA Marcey Penton, MD

## 2024-08-11 ENCOUNTER — Other Ambulatory Visit: Payer: Self-pay

## 2024-08-11 ENCOUNTER — Emergency Department
Admission: EM | Admit: 2024-08-11 | Discharge: 2024-08-11 | Disposition: A | Discharge status: Home / Self Care | Attending: Emergency Medicine | Admitting: Emergency Medicine

## 2024-08-11 ENCOUNTER — Ambulatory Visit

## 2024-08-11 ENCOUNTER — Ambulatory Visit: Admission: RE | Admit: 2024-08-11 | Discharge: 2024-08-11 | Attending: Oncology | Admitting: Oncology

## 2024-08-11 DIAGNOSIS — S065XAA Traumatic subdural hemorrhage with loss of consciousness status unknown, initial encounter: Secondary | ICD-10-CM

## 2024-08-11 DIAGNOSIS — D72829 Elevated white blood cell count, unspecified: Secondary | ICD-10-CM | POA: Diagnosis not present

## 2024-08-11 DIAGNOSIS — C3490 Malignant neoplasm of unspecified part of unspecified bronchus or lung: Secondary | ICD-10-CM

## 2024-08-11 DIAGNOSIS — I10 Essential (primary) hypertension: Secondary | ICD-10-CM | POA: Diagnosis not present

## 2024-08-11 DIAGNOSIS — W1839XA Other fall on same level, initial encounter: Secondary | ICD-10-CM | POA: Diagnosis not present

## 2024-08-11 DIAGNOSIS — S065X0A Traumatic subdural hemorrhage without loss of consciousness, initial encounter: Secondary | ICD-10-CM | POA: Insufficient documentation

## 2024-08-11 DIAGNOSIS — D649 Anemia, unspecified: Secondary | ICD-10-CM | POA: Insufficient documentation

## 2024-08-11 DIAGNOSIS — E871 Hypo-osmolality and hyponatremia: Secondary | ICD-10-CM | POA: Diagnosis not present

## 2024-08-11 LAB — CBC
HCT: 34.8 % — ABNORMAL LOW (ref 39.0–52.0)
Hemoglobin: 11.4 g/dL — ABNORMAL LOW (ref 13.0–17.0)
MCH: 28.5 pg (ref 26.0–34.0)
MCHC: 32.8 g/dL (ref 30.0–36.0)
MCV: 87 fL (ref 80.0–100.0)
Platelets: 176 K/uL (ref 150–400)
RBC: 4 MIL/uL — ABNORMAL LOW (ref 4.22–5.81)
RDW: 17.7 % — ABNORMAL HIGH (ref 11.5–15.5)
WBC: 25.2 K/uL — ABNORMAL HIGH (ref 4.0–10.5)
nRBC: 0 % (ref 0.0–0.2)

## 2024-08-11 LAB — BASIC METABOLIC PANEL WITH GFR
Anion gap: 11 (ref 5–15)
BUN: 11 mg/dL (ref 8–23)
CO2: 23 mmol/L (ref 22–32)
Calcium: 8.8 mg/dL — ABNORMAL LOW (ref 8.9–10.3)
Chloride: 93 mmol/L — ABNORMAL LOW (ref 98–111)
Creatinine, Ser: 0.55 mg/dL — ABNORMAL LOW (ref 0.61–1.24)
GFR, Estimated: >60 mL/min (ref >60)
Glucose, Bld: 139 mg/dL — ABNORMAL HIGH (ref 70–99)
Potassium: 4.7 mmol/L (ref 3.5–5.1)
Sodium: 127 mmol/L — ABNORMAL LOW (ref 135–145)

## 2024-08-11 LAB — PROTIME-INR
INR: 1.3 — ABNORMAL HIGH (ref 0.8–1.2)
Prothrombin Time: 16.6 s — ABNORMAL HIGH (ref 11.4–15.2)

## 2024-08-11 MED ORDER — GADOBUTROL 1 MMOL/ML IV SOLN
7.0000 mL | Freq: Once | INTRAVENOUS | Status: AC | PRN
  Administered 2024-08-11: 7 mL via INTRAVENOUS

## 2024-08-11 NOTE — Discharge Instructions (Signed)
 You were seen in the ER today for evaluation of your abnormal MRI test.  This showed that you have some blood around your brain.  We reviewed it with our neurosurgeon specialist.  We do feel it is safe for you to go home today.  It is important that you continue to follow-up with your cancer team as directed.  Have also included information to follow-up with the neurosurgeon.  Return to the ER for new or worsening symptoms.

## 2024-08-11 NOTE — ED Triage Notes (Signed)
 Pt to ED via POV from MRI. Pt's wife reports pt had fall yesterday and was found laying on left shoulder. Pt went for MRI today and sent due to it showing subdural hematoma. Pt reports chronic pain and denies head pain. No no blood thinners. Pt denies hx of afib. Hr in triage 40s

## 2024-08-11 NOTE — ED Provider Notes (Signed)
 [facility] Provider Note    Event Date/Time   First MD Initiated Contact with Patient 08/11/24 [phone]     (approximate)   History   Fall   HPI  Barry Small is a 89 year old male with history of lung cancer with widespread metastatic disease presenting to the emergency department for evaluation of abnormal MRI results.  Had MRI brain ordered by his primary oncologist to complete staging.  Did report recent fall.  MRI demonstrated a right sided subdural with mild mass effect on the right lateral ventricle, directed to the ER for further evaluation.  Patient reports that when he was try to get into his door yesterday there was an issue with the handle so he fell backwards. Does not think he hit his head. No LOC.  Wife present with patient.  She reports that he has had some mild weakness and confusion ongoing for a while, does not feel he has had any change in this since his fall.  Needed some help up initially, but has been able to walk around since then.  Reviewed oncology visit from 08/03/24, MRI brain ordered, plans for chemotherapy at that time.       Physical Exam   Triage Vital Signs: ED Triage Vitals [08/11/24 0947]  Encounter Vitals Group     BP 122/60     Girls Systolic BP Percentile      Girls Diastolic BP Percentile      Boys Systolic BP Percentile      Boys Diastolic BP Percentile      Pulse Rate (!) 42     Resp 18     Temp 98 F (36.7 C)     Temp Source Oral     SpO2 96 %     Weight      Height      Head Circumference      Peak Flow      Pain Score 5     Pain Loc      Pain Education      Exclude from Growth Chart     Most recent vital signs: Vitals:   08/11/24 1030 08/11/24 1130  BP: (!) 120/57 (!) 129/59  Pulse: 83 79  Resp: (!) 25 19  Temp:    SpO2: 98% 94%     Nursing notes and vital signs reviewed.  General: Adult male, lying in bed, awake interactive Head: Atraumatic Back: No midline tenderness Chest:  Symmetric chest rise, no tenderness to palpation.  Cardiac: Regular rhythm and rate.  Respiratory: Lungs clear to auscultation Abdomen: Soft, nondistended. No tenderness to palpation.  Pelvis: Stable in AP and lateral compression. No tenderness to palpation. MSK: No deformity to bilateral upper and lower extremity. Full range of motion to bilateral upper lower extremity. Neuro: Alert, oriented. GCS 15.  Mild symmetric weakness of the bilateral upper and lower extremities.  Intact sensation.  Normal finger-nose testing. Skin: No evidence of burns or lacerations.   ED Results / Procedures / Treatments   Labs (all labs ordered are listed, but only abnormal results are displayed) Labs Reviewed  CBC - Abnormal; Notable for the following components:      Result Value   WBC 25.2 (*)    RBC 4.00 (*)    Hemoglobin 11.4 (*)    HCT 34.8 (*)    RDW 17.7 (*)    All other components within normal limits  BASIC METABOLIC PANEL WITH GFR - Abnormal; Notable for the following components:  Sodium 127 (*)    Chloride 93 (*)    Glucose, Bld 139 (*)    Creatinine, Ser 0.55 (*)    Calcium  8.8 (*)    All other components within normal limits  PROTIME-INR - Abnormal; Notable for the following components:   Prothrombin Time 16.6 (*)    INR 1.3 (*)    All other components within normal limits     EKG EKG independently reviewed and interpreted by myself demonstrates:    RADIOLOGY Imaging independently reviewed and interpreted by myself demonstrates:  MRI of the brain demonstrates a right-sided subdural hematoma with mild mass effect, no metastatic disease  Formal Radiology Read:  MR Brain W Wo Contrast Result Date: 08/11/2024 EXAM: MRI BRAIN WITH AND WITHOUT CONTRAST 08/11/2024 09:32:31 AM TECHNIQUE: Multiplanar multisequence MRI of the head/brain was performed with and without the administration of intravenous contrast. 7 mL gadobutrol (GADAVIST) 1 MMOL/ML injection. COMPARISON: PET CT  07/22/2024. CLINICAL HISTORY: 89 year old male with recently diagnosed lung mass, metastatic disease evaluation for NSCLC, and also reports a recent fall. FINDINGS: BRAIN AND VENTRICLES: Right side subdural hematoma (series 9 image 41 and coronal series 17 image 16), measures 5 to 6 mm maximal thickness over the superior right hemisphere convexity. There is mild associated mass effect on the right lateral ventricle. Trace leftward midline shift (series 9 image 31). Subdural blood products, mild susceptibility artifact on diffusion imaging. No other acute intracranial hemorrhage identified. Normal basilar cisterns. No superimposed acute infarct. No ventriculomegaly. Following contrast, the major dural venous sinuses are enhancing and appear patent. There is reactive right hemisphere pachymeningeal thickening and enhancement (series 19 image 14). No other dural abnormality is identified. No other abnormal intracranial enhancement is identified. Mild for age periventricular and scattered cerebral white matter T2 and FLAIR hyperintensity, most commonly due to chronic small vessel disease. No cortical encephalomalacia identified. Deep gray nuclei, brainstem and cerebellum appear negative. The sella is unremarkable. Normal flow voids. ORBITS: No acute abnormality. SINUSES: No acute abnormality. BONES AND SOFT TISSUES: Normal bone marrow signal and enhancement. Partially visible cervical spine degeneration. No acute soft tissue abnormality. IMPRESSION: 1. Right subdural hematoma (56 mm) with mild mass effect on the right lateral ventricle and trace leftward midline shift. From the MRI department, I instructed this patient be escorted to the emergency department for further evaluation and management. 2. No metastatic disease identified. No other acute intracranial abnormality. Electronically signed by: Helayne Hurst MD 08/11/2024 10:02 AM EST RP Workstation: [id number]    PROCEDURES:  Critical Care performed:  No  Procedures   MEDICATIONS ORDERED IN ED: Medications - No data to display   IMPRESSION / MDM / ASSESSMENT AND PLAN / ED COURSE  I reviewed the triage vital signs and the nursing notes.  Differential diagnosis includes, but is not limited to, traumatic bleed, much lower suspicion atraumatic bleed, weakness related to known malignancy, anemia, electrolyte abnormality  Patient's presentation is most consistent with acute presentation with potential threat to life or bodily function.  89 year old male presenting after a fall after outpatient MRI demonstrated subdural hematoma.  Labs sent from triage with leukocytosis though only slightly increased in prior and patient denying any infectious symptoms.  Stable anemia.  BMP with hyponatremia, similar to recent priors.  Overall reassuring neurologic exam here without focal deficits.  Will review with neurosurgery.  Clinical Course as of 08/11/24 1215  Thu Aug 11, 2024  1205 Case discussed with Dr. Clois with neurosurgery.  On his review of the patient's MRI, the  subdural does not appear acute.  He does not think it is related to patient's fall yesterday.  In the absence of any acute concerning clinical findings, does not feel that patient requires repeat imaging or further evaluation in the ER here. [NR]  1213 Patient reassessed and updated on conversation with neurosurgery.  They are agreeable with plan for discharge and continued outpatient follow-up with her oncology team.  Strict return precautions provided.  Patient discharged stable condition. [NR]    Clinical Course User Index [NR] Levander Slate, MD     FINAL CLINICAL IMPRESSION(S) / ED DIAGNOSES   Final diagnoses:  Subdural hematoma (HCC)     Rx / DC Orders   ED Discharge Orders     None        Note:  This document was prepared using Dragon voice recognition software and may include unintentional dictation errors.   Levander Slate, MD 08/11/24 1215

## 2024-08-12 NOTE — Progress Notes (Signed)
 Patient for IR Port Placement on Monday 08/15/24, I called and spoke with the patient's wife, Sharman on the phone and gave pre-procedure instructions. Sharman was made aware to have the patient here at 8:30a, NPO after MN prior to procedure as well as driver post procedure/recovery/discharge. Sharman stated understanding.  Called 08/12/24

## 2024-08-14 ENCOUNTER — Other Ambulatory Visit: Payer: Self-pay | Admitting: Radiology

## 2024-08-14 DIAGNOSIS — C3490 Malignant neoplasm of unspecified part of unspecified bronchus or lung: Secondary | ICD-10-CM | POA: Diagnosis not present

## 2024-08-15 ENCOUNTER — Emergency Department

## 2024-08-15 ENCOUNTER — Ambulatory Visit: Admission: RE | Admit: 2024-08-15 | Admitting: Radiology

## 2024-08-15 ENCOUNTER — Inpatient Hospital Stay
Admission: EM | Admit: 2024-08-15 | Discharge: 2024-08-21 | DRG: 070 | Disposition: A | Attending: Internal Medicine | Admitting: Internal Medicine

## 2024-08-15 ENCOUNTER — Other Ambulatory Visit: Payer: Self-pay

## 2024-08-15 ENCOUNTER — Encounter: Payer: Self-pay | Admitting: Internal Medicine

## 2024-08-15 ENCOUNTER — Encounter: Payer: Self-pay | Admitting: *Deleted

## 2024-08-15 DIAGNOSIS — E785 Hyperlipidemia, unspecified: Secondary | ICD-10-CM | POA: Diagnosis not present

## 2024-08-15 DIAGNOSIS — L89322 Pressure ulcer of left buttock, stage 2: Secondary | ICD-10-CM | POA: Diagnosis not present

## 2024-08-15 DIAGNOSIS — R410 Disorientation, unspecified: Secondary | ICD-10-CM | POA: Diagnosis not present

## 2024-08-15 DIAGNOSIS — E871 Hypo-osmolality and hyponatremia: Secondary | ICD-10-CM | POA: Diagnosis not present

## 2024-08-15 DIAGNOSIS — Z961 Presence of intraocular lens: Secondary | ICD-10-CM | POA: Diagnosis not present

## 2024-08-15 DIAGNOSIS — I5042 Chronic combined systolic (congestive) and diastolic (congestive) heart failure: Secondary | ICD-10-CM | POA: Diagnosis present

## 2024-08-15 DIAGNOSIS — Z515 Encounter for palliative care: Secondary | ICD-10-CM | POA: Diagnosis not present

## 2024-08-15 DIAGNOSIS — S065X0A Traumatic subdural hemorrhage without loss of consciousness, initial encounter: Secondary | ICD-10-CM | POA: Diagnosis not present

## 2024-08-15 DIAGNOSIS — W19XXXA Unspecified fall, initial encounter: Secondary | ICD-10-CM

## 2024-08-15 DIAGNOSIS — G9341 Metabolic encephalopathy: Secondary | ICD-10-CM | POA: Diagnosis not present

## 2024-08-15 DIAGNOSIS — R54 Age-related physical debility: Secondary | ICD-10-CM | POA: Diagnosis not present

## 2024-08-15 DIAGNOSIS — C801 Malignant (primary) neoplasm, unspecified: Secondary | ICD-10-CM | POA: Diagnosis not present

## 2024-08-15 DIAGNOSIS — I6203 Nontraumatic chronic subdural hemorrhage: Secondary | ICD-10-CM | POA: Diagnosis not present

## 2024-08-15 DIAGNOSIS — C3491 Malignant neoplasm of unspecified part of right bronchus or lung: Secondary | ICD-10-CM | POA: Diagnosis present

## 2024-08-15 DIAGNOSIS — I4891 Unspecified atrial fibrillation: Secondary | ICD-10-CM | POA: Diagnosis not present

## 2024-08-15 DIAGNOSIS — M8458XA Pathological fracture in neoplastic disease, other specified site, initial encounter for fracture: Secondary | ICD-10-CM | POA: Diagnosis not present

## 2024-08-15 DIAGNOSIS — M549 Dorsalgia, unspecified: Secondary | ICD-10-CM | POA: Diagnosis not present

## 2024-08-15 DIAGNOSIS — C3490 Malignant neoplasm of unspecified part of unspecified bronchus or lung: Secondary | ICD-10-CM | POA: Diagnosis present

## 2024-08-15 DIAGNOSIS — D72829 Elevated white blood cell count, unspecified: Secondary | ICD-10-CM | POA: Diagnosis present

## 2024-08-15 DIAGNOSIS — I2510 Atherosclerotic heart disease of native coronary artery without angina pectoris: Secondary | ICD-10-CM | POA: Diagnosis not present

## 2024-08-15 DIAGNOSIS — C7951 Secondary malignant neoplasm of bone: Secondary | ICD-10-CM

## 2024-08-15 DIAGNOSIS — R0989 Other specified symptoms and signs involving the circulatory and respiratory systems: Secondary | ICD-10-CM | POA: Diagnosis not present

## 2024-08-15 DIAGNOSIS — Y92002 Bathroom of unspecified non-institutional (private) residence single-family (private) house as the place of occurrence of the external cause: Secondary | ICD-10-CM | POA: Diagnosis not present

## 2024-08-15 DIAGNOSIS — S065XAA Traumatic subdural hemorrhage with loss of consciousness status unknown, initial encounter: Secondary | ICD-10-CM | POA: Diagnosis present

## 2024-08-15 DIAGNOSIS — R339 Retention of urine, unspecified: Secondary | ICD-10-CM | POA: Diagnosis present

## 2024-08-15 DIAGNOSIS — R0689 Other abnormalities of breathing: Secondary | ICD-10-CM | POA: Diagnosis not present

## 2024-08-15 DIAGNOSIS — I10 Essential (primary) hypertension: Secondary | ICD-10-CM | POA: Diagnosis present

## 2024-08-15 DIAGNOSIS — I110 Hypertensive heart disease with heart failure: Secondary | ICD-10-CM | POA: Diagnosis not present

## 2024-08-15 DIAGNOSIS — G893 Neoplasm related pain (acute) (chronic): Secondary | ICD-10-CM | POA: Diagnosis present

## 2024-08-15 DIAGNOSIS — M8450XA Pathological fracture in neoplastic disease, unspecified site, initial encounter for fracture: Secondary | ICD-10-CM | POA: Diagnosis not present

## 2024-08-15 DIAGNOSIS — C787 Secondary malignant neoplasm of liver and intrahepatic bile duct: Secondary | ICD-10-CM | POA: Diagnosis not present

## 2024-08-15 DIAGNOSIS — R4182 Altered mental status, unspecified: Principal | ICD-10-CM

## 2024-08-15 DIAGNOSIS — W1830XA Fall on same level, unspecified, initial encounter: Secondary | ICD-10-CM | POA: Diagnosis not present

## 2024-08-15 DIAGNOSIS — Y92009 Unspecified place in unspecified non-institutional (private) residence as the place of occurrence of the external cause: Secondary | ICD-10-CM

## 2024-08-15 DIAGNOSIS — Z66 Do not resuscitate: Secondary | ICD-10-CM | POA: Diagnosis not present

## 2024-08-15 DIAGNOSIS — R1030 Lower abdominal pain, unspecified: Secondary | ICD-10-CM | POA: Diagnosis not present

## 2024-08-15 DIAGNOSIS — K219 Gastro-esophageal reflux disease without esophagitis: Secondary | ICD-10-CM | POA: Diagnosis not present

## 2024-08-15 DIAGNOSIS — J9601 Acute respiratory failure with hypoxia: Secondary | ICD-10-CM | POA: Diagnosis not present

## 2024-08-15 DIAGNOSIS — R531 Weakness: Secondary | ICD-10-CM

## 2024-08-15 DIAGNOSIS — R918 Other nonspecific abnormal finding of lung field: Secondary | ICD-10-CM | POA: Diagnosis not present

## 2024-08-15 DIAGNOSIS — E876 Hypokalemia: Secondary | ICD-10-CM | POA: Diagnosis not present

## 2024-08-15 DIAGNOSIS — R000 Tachycardia, unspecified: Secondary | ICD-10-CM | POA: Diagnosis not present

## 2024-08-15 LAB — COMPREHENSIVE METABOLIC PANEL WITH GFR
ALT: 27 U/L (ref 0–44)
AST: 67 U/L — ABNORMAL HIGH (ref 15–41)
Albumin: 3.2 g/dL — ABNORMAL LOW (ref 3.5–5.0)
Alkaline Phosphatase: 322 U/L — ABNORMAL HIGH (ref 38–126)
Anion gap: 12 (ref 5–15)
BUN: 14 mg/dL (ref 8–23)
CO2: 24 mmol/L (ref 22–32)
Calcium: 9.3 mg/dL (ref 8.9–10.3)
Chloride: 95 mmol/L — ABNORMAL LOW (ref 98–111)
Creatinine, Ser: 0.56 mg/dL — ABNORMAL LOW (ref 0.61–1.24)
GFR, Estimated: >60 mL/min (ref >60)
Glucose, Bld: 113 mg/dL — ABNORMAL HIGH (ref 70–99)
Potassium: 3.9 mmol/L (ref 3.5–5.1)
Sodium: 132 mmol/L — ABNORMAL LOW (ref 135–145)
Total Bilirubin: 0.8 mg/dL (ref 0.0–1.2)
Total Protein: 6.6 g/dL (ref 6.5–8.1)

## 2024-08-15 LAB — CBC
HCT: 36 % — ABNORMAL LOW (ref 39.0–52.0)
Hemoglobin: 11.8 g/dL — ABNORMAL LOW (ref 13.0–17.0)
MCH: 28.6 pg (ref 26.0–34.0)
MCHC: 32.8 g/dL (ref 30.0–36.0)
MCV: 87.4 fL (ref 80.0–100.0)
Platelets: 191 K/uL (ref 150–400)
RBC: 4.12 MIL/uL — ABNORMAL LOW (ref 4.22–5.81)
RDW: 17.8 % — ABNORMAL HIGH (ref 11.5–15.5)
WBC: 24.6 K/uL — ABNORMAL HIGH (ref 4.0–10.5)
nRBC: 0 % (ref 0.0–0.2)

## 2024-08-15 LAB — MAGNESIUM: Magnesium: 1.9 mg/dL (ref 1.7–2.4)

## 2024-08-15 LAB — PRO BRAIN NATRIURETIC PEPTIDE: Pro Brain Natriuretic Peptide: 2478 pg/mL — ABNORMAL HIGH (ref <300.0)

## 2024-08-15 LAB — LACTIC ACID, PLASMA: Lactic Acid, Venous: 1.8 mmol/L (ref 0.5–1.9)

## 2024-08-15 MED ORDER — ADULT MULTIVITAMIN W/MINERALS CH
1.0000 | ORAL_TABLET | Freq: Every day | ORAL | Status: AC
  Administered 2024-08-16 – 2024-08-18 (×3): 1 via ORAL
  Filled 2024-08-15 (×3): qty 1

## 2024-08-15 MED ORDER — LIDOCAINE 5 % EX PTCH
1.0000 | MEDICATED_PATCH | CUTANEOUS | Status: DC
  Administered 2024-08-15 – 2024-08-18 (×4): 1 via TRANSDERMAL
  Filled 2024-08-15 (×4): qty 1

## 2024-08-15 MED ORDER — ROSUVASTATIN CALCIUM 20 MG PO TABS
20.0000 mg | ORAL_TABLET | Freq: Every day | ORAL | Status: DC
  Administered 2024-08-16 – 2024-08-18 (×3): 20 mg via ORAL
  Filled 2024-08-15: qty 2
  Filled 2024-08-15: qty 1
  Filled 2024-08-15: qty 2

## 2024-08-15 MED ORDER — NITROGLYCERIN 0.4 MG SL SUBL: 0.4000 mg | SUBLINGUAL_TABLET | SUBLINGUAL | Status: DC | PRN

## 2024-08-15 MED ORDER — SODIUM CHLORIDE 1 G PO TABS
1.0000 g | ORAL_TABLET | Freq: Two times a day (BID) | ORAL | Status: DC
  Administered 2024-08-16 – 2024-08-18 (×6): 1 g via ORAL
  Filled 2024-08-15 (×6): qty 1

## 2024-08-15 MED ORDER — METOPROLOL TARTRATE 25 MG PO TABS
25.0000 mg | ORAL_TABLET | Freq: Two times a day (BID) | ORAL | Status: DC
  Administered 2024-08-16 – 2024-08-18 (×6): 25 mg via ORAL
  Filled 2024-08-15 (×6): qty 1

## 2024-08-15 MED ORDER — POLYETHYLENE GLYCOL 3350 17 G PO PACK: 17.0000 g | PACK | Freq: Every day | ORAL | Status: DC | PRN

## 2024-08-15 MED ORDER — ALBUTEROL SULFATE (2.5 MG/3ML) 0.083% IN NEBU: 2.5000 mg | INHALATION_SOLUTION | RESPIRATORY_TRACT | Status: DC | PRN

## 2024-08-15 MED ORDER — HYDRALAZINE HCL 20 MG/ML IJ SOLN: 5.0000 mg | INTRAMUSCULAR | Status: DC | PRN

## 2024-08-15 MED ORDER — DM-GUAIFENESIN ER 30-600 MG PO TB12
1.0000 | ORAL_TABLET | Freq: Two times a day (BID) | ORAL | Status: DC | PRN
  Filled 2024-08-15: qty 1

## 2024-08-15 MED ORDER — SENNOSIDES-DOCUSATE SODIUM 8.6-50 MG PO TABS
1.0000 | ORAL_TABLET | Freq: Two times a day (BID) | ORAL | Status: DC
  Administered 2024-08-15 – 2024-08-18 (×7): 1 via ORAL
  Filled 2024-08-15 (×7): qty 1

## 2024-08-15 MED ORDER — ONDANSETRON HCL 4 MG/2ML IJ SOLN: 4.0000 mg | Freq: Three times a day (TID) | INTRAMUSCULAR | Status: DC | PRN

## 2024-08-15 MED ORDER — ACETAMINOPHEN 325 MG PO TABS
650.0000 mg | ORAL_TABLET | Freq: Four times a day (QID) | ORAL | Status: DC | PRN
  Administered 2024-08-15: 650 mg via ORAL
  Filled 2024-08-15: qty 2

## 2024-08-15 MED ORDER — SODIUM CHLORIDE 0.9 % IV BOLUS
500.0000 mL | Freq: Once | INTRAVENOUS | Status: AC
  Administered 2024-08-15: 500 mL via INTRAVENOUS

## 2024-08-15 MED ORDER — VITAMIN B-12 1000 MCG PO TABS
5000.0000 ug | ORAL_TABLET | Freq: Every day | ORAL | Status: DC
  Administered 2024-08-16 – 2024-08-18 (×3): 5000 ug via ORAL
  Filled 2024-08-15 (×3): qty 5

## 2024-08-15 MED ORDER — VITAMIN D 25 MCG (1000 UNIT) PO TABS
2000.0000 [IU] | ORAL_TABLET | Freq: Every day | ORAL | Status: AC
  Administered 2024-08-16 – 2024-08-18 (×3): 2000 [IU] via ORAL
  Filled 2024-08-15 (×3): qty 2

## 2024-08-15 MED ORDER — SODIUM CHLORIDE 0.9 % IV SOLN
3.0000 g | Freq: Four times a day (QID) | INTRAVENOUS | Status: DC
  Administered 2024-08-15 – 2024-08-19 (×14): 3 g via INTRAVENOUS
  Filled 2024-08-15 (×15): qty 8

## 2024-08-15 MED ORDER — IOHEXOL 350 MG/ML SOLN
100.0000 mL | Freq: Once | INTRAVENOUS | Status: AC | PRN
  Administered 2024-08-15: 100 mL via INTRAVENOUS

## 2024-08-15 NOTE — ED Notes (Signed)
 Called CCMD for central monitoring at this time

## 2024-08-15 NOTE — ED Provider Notes (Signed)
 [facility] Provider Note    Event Date/Time   First MD Initiated Contact with Patient 08/15/24 1656     (approximate)   History   Altered Mental Status   HPI  Barry Small is a 89 y.o. male past medical history significant for lung cancer with widespread metastasis, presents to the emergency department with altered mental status, weakness and fall.  History is provided by the patient's wife who is at bedside.  Patient's wife states that he got up today and had another fall in the bathroom.  States that since having the fall has been more confused than normal.  States that he was recently diagnosed with cancer and today was supposed to go get his port placed to start chemotherapy and has not yet started chemotherapy.  Denies being on any anticoagulation.  Uncertain if he hit his head.  Complaining of headache, back pain, lower abdominal pain.  Recently started on codeine and has been more confused and altered since that time.  States that every time she went to stand him up she could not get him up because he was having generalized weakness.  Denies any blood in his stool or melanotic stool.  Denies any significant shortness of breath or chest pain at this time.  Denies dysuria, urinary urgency or frequency.  Wife stated that he also started having visual hallucinations since being in the emergency department.  In chart review recently evaluated for concern for subdural hematoma.     Physical Exam   Triage Vital Signs: ED Triage Vitals [08/15/24 1540]  Encounter Vitals Group     BP 125/81     Girls Systolic BP Percentile      Girls Diastolic BP Percentile      Boys Systolic BP Percentile      Boys Diastolic BP Percentile      Pulse Rate (!) 122     Resp 20     Temp 97.8 F (36.6 C)     Temp Source Oral     SpO2 93 %     Weight      Height      Head Circumference      Peak Flow      Pain Score      Pain Loc      Pain Education      Exclude  from Growth Chart     Most recent vital signs: Vitals:   08/15/24 1705 08/15/24 1830  BP: 139/75 138/68  Pulse: (!) 108 (!) 110  Resp: 18 18  Temp:    SpO2: 98% 95%    Physical Exam Constitutional:      Appearance: He is well-developed.  HENT:     Head: Atraumatic.     Mouth/Throat:     Mouth: Mucous membranes are dry.  Eyes:     Extraocular Movements: Extraocular movements intact.     Conjunctiva/sclera: Conjunctivae normal.     Pupils: Pupils are equal, round, and reactive to light.  Cardiovascular:     Rate and Rhythm: Tachycardia present. Rhythm irregular.     Comments: Ecchymosis to the mid thoracic right side of the back.  Pulmonary:     Effort: No respiratory distress.  Abdominal:     Tenderness: There is abdominal tenderness (Mild diffuse abdominal tenderness to palpation with no rebound or guarding).  Musculoskeletal:     Cervical back: Normal range of motion. Tenderness present.     Right lower leg: Edema present.  Left lower leg: Edema present.     Comments: Midline thoracic and lumbar tenderness to palpation.  Mild tenderness to bilateral pelvis.  No bony deformities or step-offs.  Skin:    General: Skin is warm.     Capillary Refill: Capillary refill takes 2 to 3 seconds.  Neurological:     General: No focal deficit present.     Mental Status: He is alert. He is disoriented.  Psychiatric:        Mood and Affect: Mood normal.     IMPRESSION / MDM / ASSESSMENT AND PLAN / ED COURSE  I reviewed the triage vital signs and the nursing notes.  Differential diagnosis including intracranial hemorrhage, progression of subdural hematoma, metastasis to the brain, worsening cancer, metabolic encephalopathy, polypharmacy, sepsis, urinary tract infection, dehydration, progression of malignancy, anemia, fracture, pulmonary embolism  Given multiple areas concerning for possible trauma with known malignancy and history of pathologic fractures plan for CTA to further  evaluate for pulmonary embolism and CT scan of the abdomen and pelvis with T and L reformats.   EKG  I, Clotilda Punter, the attending physician, personally viewed and interpreted this ECG. EKG showed atrial fibrillation with rapid rate of 116.  Narrow complex.  QTc 475.  No significant ST elevation or depression.  Significant underlying artifact.  History of EKG 2 findings of atrial fibrillation  Tachycardic and bradycardic dysrhythmias with atrial fibrillation while on cardiac telemetry.  RADIOLOGY  CT scan of the head with chronic appearing right frontal subdural measuring up to 7 mm that had previously measured 5 to 6 mm.  Stable without midline shift.  No infarct or hemorrhage.  CT scan of the cervical spine with diffuse bony metastasis  CTA PE study and CT scan with diffuse bony metastasis    LABS (all labs ordered are listed, but only abnormal results are displayed) Labs interpreted as -    Labs Reviewed  COMPREHENSIVE METABOLIC PANEL WITH GFR - Abnormal; Notable for the following components:      Result Value   Sodium 132 (*)    Chloride 95 (*)    Glucose, Bld 113 (*)    Creatinine, Ser 0.56 (*)    Albumin 3.2 (*)    AST 67 (*)    Alkaline Phosphatase 322 (*)    All other components within normal limits  CBC - Abnormal; Notable for the following components:   WBC 24.6 (*)    RBC 4.12 (*)    Hemoglobin 11.8 (*)    HCT 36.0 (*)    RDW 17.8 (*)    All other components within normal limits  LACTIC ACID, PLASMA  URINALYSIS, ROUTINE W REFLEX MICROSCOPIC  CBG MONITORING, ED     MDM  Patient has leukocytosis of 24.6 but on chart review patient has had an elevated white count, patient's wife stated that he had been treated for an infectious process but ultimately they thought it was secondary to his cancer.  Leukocytosis of 24.6, normal lactic acid, creatinine appears to be at his baseline.  Mild hyponatremia of 132.  Alk phos elevated likely in the setting of his  known bony metastasis.  Normal T. bili.  Patient given a 500 bolus of IV fluids given his tachycardia and findings concerning for dehydration.  CTA with no signs of pulmonary embolism.  Worsening right lower lobe airspace disease surrounding the known malignancy which may represent pneumonia versus progression of malignancy.  CT scan of the abdomen with large destructive right mass compatible  with metastatic disease which unchanged from prior.  Multiple osseous metastasis throughout the lumbar spine and left iliac crest.  2 hepatic lesions concerning for metastasis.  CT scan of the head with possibly slight worsening of subdural hematoma 7 mm from 6 mm but no midline shift.  On reevaluation continues to be tachycardic with heart rate in the low 100s.  Ongoing confusion and generalized weakness.  Has not yet started any treatment for chemotherapy.  Consulted hospitalist for admission for altered mental status, progression of malignancy with multiple bony metastasis, weakness and atrial fibrillation     PROCEDURES:  Critical Care performed: No  Procedures  Patient's presentation is most consistent with acute presentation with potential threat to life or bodily function.   MEDICATIONS ORDERED IN ED: Medications  sodium chloride  0.9 % bolus 500 mL (0 mLs Intravenous Stopped 08/15/24 2022)  iohexol  (OMNIPAQUE ) 350 MG/ML injection 100 mL (100 mLs Intravenous Contrast Given 08/15/24 1912)    FINAL CLINICAL IMPRESSION(S) / ED DIAGNOSES   Final diagnoses:  Altered mental status, unspecified altered mental status type  Weakness  Pathological fracture due to malignant neoplasm metastatic to bone [facility])     Rx / DC Orders   ED Discharge Orders     None        Note:  This document was prepared using Dragon voice recognition software and may include unintentional dictation errors.   Suzanne Kirsch, MD 08/15/24 2118

## 2024-08-15 NOTE — ED Triage Notes (Signed)
 Pt to ED via [facility] from home. Lung cancer dx 2 wks ago. Pt with increased AMS per wife. Pt seen here on 12/4 for subdural hematoma.    HR 60/120s 140/60 95% RA  98.3 temp CBG 132

## 2024-08-15 NOTE — ED Triage Notes (Signed)
 Pt to ED via [facility] from home. Wife here with pt. Pt dx with lung cancer 2 wks ago and was going this morning to get a port placed but wife reports pt acting more confused and had another fall this am. No blood thinners. Pt seen on 12/4 for subdural hematoma after fall.

## 2024-08-15 NOTE — H&P (Signed)
 History and Physical    Barry Small FMW:[id number] DOB: [birth year] DOA: 08/15/2024  Referring MD/NP/PA:   PCP: [name], MD   Patient coming from:  The patient is coming from home.     Chief Complaint: AMS  HPI: Barry Small is a 89 y.o. 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 The plan is to place [facility] for chemotherapy and also plan to start radiation therapy soon. Per his wife, the procedure for port placement was canceled today since patient was found to be confused earlier today. Patient's wife states that pt got up today and had another fall in the bathroom.  States that since having the fall has been more confused than normal.  Per his wife, patient was recently started on Norco for pain and has been more confused and altered since that time. His wife stated that pt also started having visual hallucinations since being in the emergency department. Patient has generalized weakness.  He has pain in his whole spine, worst in the middle of the spine. Patient has been constipated, no vomiting, diarrhea or abdominal pain.  He has dry cough, no SOB or chest pain.  No fever or chills.  No symptoms of UTI.  He is constipated. When I saw pt in ED, pt is confused, knows his own name, knows that he is in hospital, intermittently confused about the time.  Data reviewed independently and ED Course: pt was found to have WBC 24.6, lactic acid of 1.8, GFR> 60, temperature normal, blood pressure 138/68, heart rate 122 --> 110, RR 20, oxygen saturation 93-98% on room air.   CT of head: 1. Chronic appearing right frontal subdural hematoma  measuring up to 7 mm, previously having measured 5-6 mm. Stable mild effacement of the right lateral ventricle without midline shift. 2. No acute infarct or hemorrhage.  CT of C-spine: 1. No acute cervical spine fracture. 2. Diffuse bony metastases throughout all visualized cervical and thoracic vertebral bodies, with stable chronic pathologic compression fracture at C7. No change since the recent PET scan.  CT of T-spine: 1. Progression of lucent lesions throughout the lower cervical spine and thoracic spine compatible with worsening metastases, most extensive at T9.   CT of L-spine: 1. Mild compression fracture of the L1 vertebral body with heterogeneous appearance and lucency, likely signifying metastases and pathologic fracture, similar to prior study. 2. Additional subtle lucent lesions throughout the lumbar vertebrae, likely osseous metastases. 3. Soft tissue posterior to the L5 vertebral body, indeterminate between extruded L5-S1 disc fragment and epidural metastatic disease. This could be further evaluated with MRI if felt clinically indicated.  CT of abdomen/pelvis: 1. Large destructive right iliac bone mass with adjacent soft tissue component measuring 7.1 x 5.0 cm, unchanged from prior study, compatible with metastatic disease. 2. Probable lytic osseous metastases throughout the lumbar spine and in the left iliac crest. 3. Two hepatic lesions suspicious for metastases, including a 9 mm ill-defined central hepatic lesion and a subtle 1.5 cm low-density focus in the right hepatic lobe that may correspond to prior PET-avid activity.   CTA of chest: 1. No evidence of pulmonary embolus. 2. Worsening right lower lobe airspace disease surrounding the  known peripheral mass, which may represent pneumonia or progression of malignancy. 3. Extensive osseous metastatic disease throughout the spine, progressed from the prior study, most notable at T9.    EKG: Reviewed  independently. Sinus rhythm, heart rate 116, LAD, poor R progression, frequent PAC   Review of Systems: Could not be reviewed due to altered mental status.  Allergy:  Allergies  Allergen Reactions   Atorvastatin Other (See Comments)    REACTION: myalgia   Morphine  Other (See Comments)    REACTION: syncope- lower BP but may be able to tolerate low dose of med.    Simvastatin Other (See Comments)    REACTION: ? reaction    Past Medical History:  Diagnosis Date   Barrett's esophagus    Colon polyps 04/2003   type unknown    Coronary artery disease    Elevated bilirubin    Elevated glucose    GERD (gastroesophageal reflux disease)    Hyperlipidemia    Hypertension    Myocardial infarction [facility])    Pneumonia 2014    Past Surgical History:  Procedure Laterality Date   BELPHAROPTOSIS REPAIR  08/2018   Cardiolite  12/2001   h/o vessel infarct EF 66%   CATARACT EXTRACTION W/ INTRAOCULAR LENS  IMPLANT, BILATERAL  09/2018   CHOLECYSTECTOMY  09/2001   COLONOSCOPY  11/15/2008   Mild divertics, 5 yrs, Dr. Aneita   COLONOSCOPY W/ POLYPECTOMY  05/09/2003   x multiple, Dr. Viktoria   CORONARY ANGIOPLASTY  2001   x 2, Dr.Pulsipher   CORONARY ANGIOPLASTY WITH STENT PLACEMENT  12/05/2005   CORONARY ARTERY BYPASS GRAFT  1989   [facility]   ESOPHAGOGASTRODUODENOSCOPY  05/09/2003   Barrett's   ESOPHAGOGASTRODUODENOSCOPY  11/17/08   Barrett's Esoph [facility] (Dr. Aneita)  2 years   EYE SURGERY  09/2018   cataract extraction   [facility]  03/28-03/31/2007   Chest pain   NASAL SINUS SURGERY  06/23/2008   Dr.Clark   NOSE SURGERY  06/23/2008   Dr. Gretta   Stress Myoview   09/30/2004   Normal EF 70%   Stress Myoview   02/11/2006   No change, c/w 09/2004   Stress Myoview   02/22/2008   Abnormal w/prior inf infarct  No signif ischemia    Social History:  reports that he has never smoked. He has never used smokeless tobacco. He reports current alcohol use. He reports that he does not use  drugs.  Family History:  Family History  Problem Relation Age of Onset   Heart disease Father    Pneumonia Mother    Colon cancer Neg Hx    Prostate cancer Neg Hx      Prior to Admission medications   Medication Sig Start Date End Date Taking? Authorizing Provider  acetaminophen  (TYLENOL ) 500 MG tablet Take 2 tablets (1,000 mg total) by mouth every 8 (eight) hours as needed. 07/25/24   [name], MD  aspirin  81 MG EC tablet Take 81 mg by mouth daily.    [provider]  bisacodyl  (DULCOLAX) 10 MG suppository Place 1 suppository (10 mg total) rectally as needed for moderate constipation. 08/02/24   [name], MD  Cholecalciferol  (VITAMIN D3) 2000 units TABS Take by mouth daily.    [provider]  HYDROcodone -acetaminophen  (NORCO/VICODIN) 5-325 MG tablet Take 1 tablet by mouth every 4 (four) hours as needed for moderate pain (pain score 4-6). 08/10/24   Jacobo Evalene PARAS, MD  lidocaine -prilocaine  (EMLA ) cream Apply to affected area once 08/05/24  Jacobo Evalene PARAS, MD  metoprolol  tartrate (LOPRESSOR ) 25 MG tablet Take 1 tablet (25 mg total) by mouth 2 (two) times daily. 07/20/24 08/19/24  Dezii, Alexandra, DO  Multiple Vitamins-Minerals (MULTIVITAMIN) tablet Take 1 tablet by mouth daily. 04/07/24   [name], MD  nitroGLYCERIN  (NITROSTAT ) 0.4 MG SL tablet DISSOLVE ONE TABLET UNDER TONGUE EVERY 5 MINUTES AS NEEDED FOR CHEST PAIN 04/04/24   Gollan, Timothy J, MD  omeprazole  (PRILOSEC) 20 MG capsule Take 1 capsule (20 mg total) by mouth every Monday, Wednesday, and Friday. 04/08/24   [name], MD  ondansetron  (ZOFRAN ) 8 MG tablet Take 1 tablet (8 mg total) by mouth every 8 (eight) hours as needed for nausea or vomiting. Start on the third day after carboplatin. 08/05/24   Jacobo Evalene PARAS, MD  prochlorperazine  (COMPAZINE ) 10 MG tablet Take 1 tablet (10 mg total) by mouth every 6 (six) hours as needed for nausea or vomiting. 08/05/24   Jacobo Evalene PARAS, MD  rosuvastatin  (CRESTOR ) 20 MG tablet Take 1 tablet (20 mg total) by mouth daily. 04/07/24   [name], MD  sodium chloride  1 g tablet Take 1 tablet (1 g total) by mouth 2 (two) times daily with a meal. 07/25/24   [name], MD    Physical Exam: Vitals:   08/15/24 1540 08/15/24 1705 08/15/24 1830  BP: 125/81 139/75 138/68  Pulse: (!) 122 (!) 108 (!) 110  Resp: 20 18 18   Temp: 97.8 F (36.6 C)    TempSrc: Oral    SpO2: 93% 98% 95%   General: Not in acute distress HEENT:       Eyes: PERRL, EOMI, no jaundice       ENT: No discharge from the ears and nose       Neck: No JVD, no bruit, no mass felt. Heme: No neck lymph node enlargement. Cardiac: S1/S2, RRR, No murmurs, No gallops or rubs. Respiratory: No rales, wheezing, rhonchi or rubs. GI: Soft, nondistended, nontender, no organomegaly, BS present. GU: No hematuria Ext: has trace pitting leg edema bilaterally. 1+DP/PT pulse bilaterally. Musculoskeletal: Has tenderness mainly in the midline of middle spine Skin: No rashes.  Neuro: confused, knows his own name, knows that he is in hospital, intermittently confused about the time. cranial nerves II-XII grossly intact, moves all extremities.  Psych: Has hallucination   Labs on Admission: I have personally reviewed following labs and imaging studies  CBC: Recent Labs  Lab 08/11/24 1001 08/15/24 1542  WBC 25.2* 24.6*  HGB 11.4* 11.8*  HCT 34.8* 36.0*  MCV 87.0 87.4  PLT 176 191   Basic Metabolic Panel: Recent Labs  Lab 08/11/24 1001 08/15/24 1542  NA 127* 132*  K 4.7 3.9  CL 93* 95*  CO2 23 24  GLUCOSE 139* 113*  BUN 11 14  CREATININE 0.55* 0.56*  CALCIUM  8.8* 9.3   GFR: Estimated Creatinine Clearance: 56.1 mL/min (A) (by C-G formula based on SCr of 0.56 mg/dL (L)). Liver Function Tests: Recent Labs  Lab 08/15/24 1542  AST 67*  ALT 27  ALKPHOS 322*  BILITOT 0.8  PROT 6.6  ALBUMIN 3.2*   No results for input(s): LIPASE,  AMYLASE in the last 168 hours. No results for input(s): AMMONIA in the last 168 hours. Coagulation Profile: Recent Labs  Lab 08/11/24 1001  INR 1.3*   Cardiac Enzymes: No results for input(s): CKTOTAL, CKMB, CKMBINDEX, TROPONINI in the last 168 hours. BNP (last 3 results) No results for input(s): PROBNP in  the last 8760 hours. HbA1C: No results for input(s): HGBA1C in the last 72 hours. CBG: No results for input(s): GLUCAP in the last 168 hours. Lipid Profile: No results for input(s): CHOL, HDL, LDLCALC, TRIG, CHOLHDL, LDLDIRECT in the last 72 hours. Thyroid Function Tests: No results for input(s): TSH, T4TOTAL, FREET4, T3FREE, THYROIDAB in the last 72 hours. Anemia Panel: No results for input(s): VITAMINB12, FOLATE, FERRITIN, TIBC, IRON, RETICCTPCT in the last 72 hours. Urine analysis:    Component Value Date/Time   COLORURINE YELLOW (A) 07/15/2024 1016   APPEARANCEUR CLOUDY (A) 07/15/2024 1016   LABSPEC 1.015 07/15/2024 1016   PHURINE 7.0 07/15/2024 1016   GLUCOSEU NEGATIVE 07/15/2024 1016   HGBUR NEGATIVE 07/15/2024 1016   HGBUR negative 08/12/2010 1451   BILIRUBINUR NEGATIVE 07/15/2024 1016   KETONESUR NEGATIVE 07/15/2024 1016   PROTEINUR NEGATIVE 07/15/2024 1016   UROBILINOGEN 0.2 08/12/2010 1451   NITRITE NEGATIVE 07/15/2024 1016   LEUKOCYTESUR NEGATIVE 07/15/2024 1016   Sepsis Labs: @LABRCNTIP (procalcitonin:4,lacticidven:4) )No results found for this or any previous visit (from the past 240 hours).   Radiological Exams on Admission:   Assessment/Plan Principal Problem:   Acute metabolic encephalopathy Active Problems:   Squamous cell carcinoma lung, right (HCC)   Fall at home, initial encounter   Lung cancer metastatic to bone_ to spin and liver   Chronic combined systolic and diastolic CHF (congestive heart failure) (HCC)   Hyponatremia   HTN (hypertension)   SDH (subdural hematoma) (HCC)   CAD  (coronary artery disease)   Leukocytosis   HLD (hyperlipidemia)   Assessment and Plan:   Acute metabolic encephalopathy: Etiology is not clear.  Likely multifactorial etiology, including metastasized cancer, pain, hyponatremia, SDH and Norco use. Will also need to r/o UTI.  -will admit to PCU as inpt - Frequent neurocheck - Fall precaution - Hold Norco - give  Sodium chloride  for hyponatremia - IV fluid: Normal saline 500 cc ED - Follow-up for UA  Squamous cell carcinoma lung, right and lung cancer metastatic to bone_ to spin and liver and iliac bone: -consulted Dr. Rennie of oncology - Palliative consult - soft C-collar (pt has stable chronic pathologic compression fracture at C7). - As needed Tylenol  for pain - Lidoderm  patch to middle spin  Fall at home, initial encounter -Fall precaution -PT/OT  Chronic combined systolic and diastolic CHF (congestive heart failure) (HCC): 2D echo 05/19/2024 showed EF of 45-50% with grade 2 diastolic dysfunction.  Patient has trace leg edema, but no SOB, no JVD, does not seem to have CHF exacerbation. - Check proBNP  Hyponatremia: Sodium 132 -Sodium chloride  1 g twice daily - Normal saline 500 cc in the ED  HTN (hypertension) -IV hydralazine  as needed - Continue home metoprolol   SDH (subdural hematoma) (HCC): CT of head showed chronic appearing right frontal subdural hematoma measuring up to 7 mm, previously having measured 5-6 mm. Stable mild effacement of the right lateral ventricle without midline shift. -hold ASA - Frequent neurocheck  CAD (coronary artery disease): S/p of CABG. -Hold aspirin  - Continue Crestor   Leukocytosis: Patient has chronic leukocytosis.  WBC 19.6 on 07/25/2024 and 25.2 on 08/11/24.  Today he is WBC 24.6.  Likely reactive.  CT showed worsening right lower lobe airspace disease surrounding the known peripheral mass, which may represent pneumonia or progression of malignancy.  Cannot completely rule out  possibility of aspiration pneumonia. - Started Unasyn  - Follow-up blood culture and sputum culture  HLD (hyperlipidemia) -Crestor       DVT ppx: SCD  Code Status:  DNR (I discussed with his wife and explained the meaning of CODE STATUS. Per his wife, pt wound want to be DNR)  Family Communication: Yes, patient's wife at bed side.   Disposition Plan:  Anticipate discharge back to previous environment  Consults called: Dr. Rennie of oncology  Admission status and Level of care: Progressive: as inpt        Dispo: The patient is from: Home              Anticipated d/c is to: Home              Anticipated d/c date is: 2 days              Patient currently is not medically stable to d/c.    Severity of Illness:  The appropriate patient status for this patient is INPATIENT. Inpatient status is judged to be reasonable and necessary in order to provide the required intensity of service to ensure the patient's safety. The patient's presenting symptoms, physical exam findings, and initial radiographic and laboratory data in the context of their chronic comorbidities is felt to place them at high risk for further clinical deterioration. Furthermore, it is not anticipated that the patient will be medically stable for discharge from the hospital within 2 midnights of admission.   * I certify that at the point of admission it is my clinical judgment that the patient will require inpatient hospital care spanning beyond 2 midnights from the point of admission due to high intensity of service, high risk for further deterioration and high frequency of surveillance required.*       Date of Service 08/15/2024    Caleb Exon [facility]   If 7PM-7AM, please contact night-coverage [email or web address] 08/15/2024, 11:08 PM

## 2024-08-15 NOTE — ED Notes (Signed)
 Provided water and sandwich tray per verbal order from Dr. Suzanne

## 2024-08-15 NOTE — Progress Notes (Signed)
 Received call from pt's wife stating that pt has declined significantly since last visit. Pt is having increased pain, confusion, hallucinations, and weakness. Pt previously visited ED on 12/4 after MRI scan but left before being seen. Pt's wife states that she is unable to get pt up and is concerned that he will continue to decline. Advised pt's wife that pt needs to be seen and would most likely need to go to the ED for further evaluation. Pt's wife in agreement and stated will call EMS to transport. Dr. Jacobo made aware.

## 2024-08-15 NOTE — ED Notes (Signed)
 Boosted pt in bed, provided pillow

## 2024-08-16 ENCOUNTER — Encounter: Payer: Self-pay | Admitting: Oncology

## 2024-08-16 DIAGNOSIS — Z515 Encounter for palliative care: Secondary | ICD-10-CM

## 2024-08-16 LAB — APTT: aPTT: 54 s — ABNORMAL HIGH (ref 24–36)

## 2024-08-16 LAB — BASIC METABOLIC PANEL WITH GFR
Anion gap: 14 (ref 5–15)
BUN: 12 mg/dL (ref 8–23)
CO2: 22 mmol/L (ref 22–32)
Calcium: 8.3 mg/dL — ABNORMAL LOW (ref 8.9–10.3)
Chloride: 98 mmol/L (ref 98–111)
Creatinine, Ser: 0.32 mg/dL — ABNORMAL LOW (ref 0.61–1.24)
GFR, Estimated: >60 mL/min (ref >60)
Glucose, Bld: 82 mg/dL (ref 70–99)
Potassium: 3.5 mmol/L (ref 3.5–5.1)
Sodium: 133 mmol/L — ABNORMAL LOW (ref 135–145)

## 2024-08-16 LAB — PROTIME-INR
INR: 1.4 — ABNORMAL HIGH (ref 0.8–1.2)
Prothrombin Time: 17.8 s — ABNORMAL HIGH (ref 11.4–15.2)

## 2024-08-16 LAB — PROCALCITONIN: Procalcitonin: 0.14 ng/mL

## 2024-08-16 LAB — CBC
HCT: 32 % — ABNORMAL LOW (ref 39.0–52.0)
Hemoglobin: 10.4 g/dL — ABNORMAL LOW (ref 13.0–17.0)
MCH: 28.8 pg (ref 26.0–34.0)
MCHC: 32.5 g/dL (ref 30.0–36.0)
MCV: 88.6 fL (ref 80.0–100.0)
Platelets: 170 K/uL (ref 150–400)
RBC: 3.61 MIL/uL — ABNORMAL LOW (ref 4.22–5.81)
RDW: 17.9 % — ABNORMAL HIGH (ref 11.5–15.5)
WBC: 23.3 K/uL — ABNORMAL HIGH (ref 4.0–10.5)
nRBC: 0 % (ref 0.0–0.2)

## 2024-08-16 LAB — CBG MONITORING, ED: Glucose-Capillary: 85 mg/dL (ref 70–99)

## 2024-08-16 MED ORDER — ACETAMINOPHEN 500 MG PO TABS
1000.0000 mg | ORAL_TABLET | Freq: Three times a day (TID) | ORAL | Status: DC
  Administered 2024-08-16 – 2024-08-19 (×8): 1000 mg via ORAL
  Filled 2024-08-16 (×8): qty 2

## 2024-08-16 NOTE — Consult Note (Signed)
 Palliative Medicine [facility] [phone] [phone]   Name: Barry Small Date: 08/16/2024 MRN: [id number]  DOB: May 02, 1934  Patient Care Team: [name], MD as PCP - Diedre Kennyth Chew, MD as PCP - Electrophysiology (Cardiology) Perla Evalene PARAS, MD as Consulting Physician (Cardiology) Fate Morna SAILOR, [facility] (Inactive) as Pharmacist (Pharmacist) Pa, [facility] Od Dewey, [place], RN as Oncology Nurse Navigator Jacobo, Evalene PARAS, MD as Consulting Physician (Oncology)    REASON FOR CONSULTATION: Barry Small is a 89 y.o. male with multiple medical problems including stage IVb squamous cell carcinoma right lung, who has not yet started treatment.  Patient was admitted the hospital 08/15/2024 with altered mental status.  Palliative care consulted to address goals.  SOCIAL HISTORY:     reports that he has never smoked. He has never used smokeless tobacco. He reports current alcohol  use. He reports that he does not use drugs.  Patient is married lives at home with his wife.  He has 2 daughters and grandchildren who live nearby.  Patient is a retired Engineer, Structural.  ADVANCE DIRECTIVES:  Not on file  CODE STATUS: DNR  PAST MEDICAL HISTORY: Past Medical History:  Diagnosis Date   Barrett's esophagus    Colon polyps 04/2003   type unknown    Coronary artery disease    Elevated bilirubin    Elevated glucose    GERD (gastroesophageal reflux disease)    Hyperlipidemia    Hypertension    Myocardial infarction [facility])    Pneumonia 2014    PAST SURGICAL HISTORY:  Past Surgical History:  Procedure Laterality Date   BELPHAROPTOSIS REPAIR  08/2018   Cardiolite  12/2001   h/o vessel infarct EF 66%   CATARACT EXTRACTION W/ INTRAOCULAR LENS  IMPLANT, BILATERAL  09/2018   CHOLECYSTECTOMY  09/2001   COLONOSCOPY  11/15/2008   Mild divertics, 5 yrs, Dr. Aneita   COLONOSCOPY W/ POLYPECTOMY   05/09/2003   x multiple, Dr. Viktoria   CORONARY ANGIOPLASTY  2001   x 2, Dr.Pulsipher   CORONARY ANGIOPLASTY WITH STENT PLACEMENT  12/05/2005   CORONARY ARTERY BYPASS GRAFT  1989   [facility]   ESOPHAGOGASTRODUODENOSCOPY  05/09/2003   Barrett's   ESOPHAGOGASTRODUODENOSCOPY  11/17/08   Barrett's Esoph [facility] (Dr. Aneita)  2 years   EYE SURGERY  09/2018   cataract extraction   [facility]  03/28-03/31/2007   Chest pain   NASAL SINUS SURGERY  06/23/2008   Dr.Clark   NOSE SURGERY  06/23/2008   Dr. Gretta   Stress Myoview   09/30/2004   Normal EF 70%   Stress Myoview   02/11/2006   No change, c/w 09/2004   Stress Myoview   02/22/2008   Abnormal w/prior inf infarct  No signif ischemia    HEMATOLOGY/ONCOLOGY HISTORY:  Oncology History  Squamous cell carcinoma lung, right (HCC)  08/05/2024 Initial Diagnosis   Squamous cell carcinoma lung, right (HCC)   08/05/2024 Cancer Staging   Staging form: Lung, AJCC V9 - Clinical stage from 08/05/2024: Stage IVB (cT3, cN0, cM1c2) - Signed by Jacobo Evalene PARAS, MD on 08/05/2024   08/16/2024 -  Chemotherapy   Patient is on Treatment Plan : LUNG Carboplatin + Paclitaxel q21d Dose Reduction       ALLERGIES:  is allergic to atorvastatin, morphine , and simvastatin.  MEDICATIONS:  Current Facility-Administered Medications  Medication Dose Route Frequency Provider Last Rate Last Admin   acetaminophen  (TYLENOL ) tablet 1,000 mg  1,000  mg Oral Q8H Fausto Sor A, DO   1,000 mg at 08/16/24 1021   albuterol  (PROVENTIL ) (2.5 MG/3ML) 0.083% nebulizer solution 2.5 mg  2.5 mg Nebulization Q4H PRN Niu, Xilin, MD       Ampicillin -Sulbactam (UNASYN ) 3 g in sodium chloride  0.9 % 100 mL IVPB  3 g Intravenous Q6H Niu, Xilin, MD   Stopped at 08/16/24 1133   cholecalciferol  (VITAMIN D3) 25 MCG (1000 UNIT) tablet 2,000 Units  2,000 Units Oral Daily Niu, Xilin, MD   2,000 Units at 08/16/24 1021   cyanocobalamin  (VITAMIN B12) tablet 5,000 mcg  5,000 mcg Oral Daily Niu,  Xilin, MD   5,000 mcg at 08/16/24 1021   dextromethorphan -guaiFENesin  (MUCINEX  DM) 30-600 MG per 12 hr tablet 1 tablet  1 tablet Oral BID PRN Niu, Xilin, MD       hydrALAZINE  (APRESOLINE ) injection 5 mg  5 mg Intravenous Q2H PRN Niu, Xilin, MD       lidocaine  (LIDODERM ) 5 % 1 patch  1 patch Transdermal Q24H Niu, Xilin, MD   1 patch at 08/15/24 2240   metoprolol  tartrate (LOPRESSOR ) tablet 25 mg  25 mg Oral BID Niu, Xilin, MD   25 mg at 08/16/24 1021   multivitamin with minerals tablet 1 tablet  1 tablet Oral Daily Niu, Xilin, MD   1 tablet at 08/16/24 1021   nitroGLYCERIN  (NITROSTAT ) SL tablet 0.4 mg  0.4 mg Sublingual Q5 min PRN Niu, Xilin, MD       ondansetron  (ZOFRAN ) injection 4 mg  4 mg Intravenous Q8H PRN Niu, Xilin, MD       polyethylene glycol (MIRALAX  / GLYCOLAX ) packet 17 g  17 g Oral Daily PRN Niu, Xilin, MD       rosuvastatin  (CRESTOR ) tablet 20 mg  20 mg Oral QHS Niu, Xilin, MD       senna-docusate (Senokot-S) tablet 1 tablet  1 tablet Oral BID Niu, Xilin, MD   1 tablet at 08/16/24 1021   sodium chloride  tablet 1 g  1 g Oral BID WC Niu, Xilin, MD   1 g at 08/16/24 1021   Current Outpatient Medications  Medication Sig Dispense Refill   bisacodyl  (DULCOLAX) 10 MG suppository Place 1 suppository (10 mg total) rectally as needed for moderate constipation. 12 suppository 0   Cholecalciferol  (VITAMIN D3) 2000 units TABS Take by mouth daily.     Cyanocobalamin  (VITAMIN B-12 PO) Take 5,000 mcg by mouth daily.     HYDROcodone -acetaminophen  (NORCO/VICODIN) 5-325 MG tablet Take 1 tablet by mouth every 4 (four) hours as needed for moderate pain (pain score 4-6). 60 tablet 0   metoprolol  tartrate (LOPRESSOR ) 25 MG tablet Take 1 tablet (25 mg total) by mouth 2 (two) times daily. 30 tablet 30   Multiple Vitamins-Minerals (MULTIVITAMIN) tablet Take 1 tablet by mouth daily.     nitroGLYCERIN  (NITROSTAT ) 0.4 MG SL tablet DISSOLVE ONE TABLET UNDER TONGUE EVERY 5 MINUTES AS NEEDED FOR CHEST PAIN 25  tablet 1   omeprazole  (PRILOSEC) 20 MG capsule Take 1 capsule (20 mg total) by mouth every Monday, Wednesday, and Friday. 45 capsule 3   rosuvastatin  (CRESTOR ) 20 MG tablet Take 1 tablet (20 mg total) by mouth daily. 90 tablet 3   SIMETHICONE PO Take 1 tablet by mouth as needed (gas).     acetaminophen  (TYLENOL ) 500 MG tablet Take 2 tablets (1,000 mg total) by mouth every 8 (eight) hours as needed. (Patient not taking: Reported on 08/15/2024)     aspirin  81 MG  EC tablet Take 81 mg by mouth daily. (Patient not taking: Reported on 08/15/2024)     lidocaine -prilocaine  (EMLA ) cream Apply to affected area once (Patient not taking: Reported on 08/15/2024) 30 g 3   ondansetron  (ZOFRAN ) 8 MG tablet Take 1 tablet (8 mg total) by mouth every 8 (eight) hours as needed for nausea or vomiting. Start on the third day after carboplatin. (Patient not taking: Reported on 08/15/2024) 60 tablet 2   prochlorperazine  (COMPAZINE ) 10 MG tablet Take 1 tablet (10 mg total) by mouth every 6 (six) hours as needed for nausea or vomiting. (Patient not taking: Reported on 08/15/2024) 60 tablet 2   sodium chloride  1 g tablet Take 1 tablet (1 g total) by mouth 2 (two) times daily with a meal. (Patient not taking: Reported on 08/15/2024) 60 tablet 1    VITAL SIGNS: BP (!) 118/52 (BP Location: Right Arm)   Pulse 91   Temp (!) 97.5 F (36.4 C) (Oral)   Resp (!) 26   SpO2 96%  There were no vitals filed for this visit.  Estimated body mass index is 26.09 kg/m as calculated from the following:   Height as of 08/03/24: 5' 5.25 (1.657 m).   Weight as of 08/10/24: 158 lb (71.7 kg).  LABS: CBC:    Component Value Date/Time   WBC 23.3 (H) 08/16/2024 0424   HGB 10.4 (L) 08/16/2024 0424   HCT 32.0 (L) 08/16/2024 0424   PLT 170 08/16/2024 0424   MCV 88.6 08/16/2024 0424   NEUTROABS 16.3 (H) 07/25/2024 1301   LYMPHSABS 1.3 07/25/2024 1301   MONOABS 1.7 (H) 07/25/2024 1301   EOSABS 0.2 07/25/2024 1301   BASOSABS 0.1 07/25/2024  1301   Comprehensive Metabolic Panel:    Component Value Date/Time   NA 133 (L) 08/16/2024 0424   K 3.5 08/16/2024 0424   CL 98 08/16/2024 0424   CO2 22 08/16/2024 0424   BUN 12 08/16/2024 0424   CREATININE 0.32 (L) 08/16/2024 0424   GLUCOSE 82 08/16/2024 0424   CALCIUM  8.3 (L) 08/16/2024 0424   AST 67 (H) 08/15/2024 1542   ALT 27 08/15/2024 1542   ALKPHOS 322 (H) 08/15/2024 1542   BILITOT 0.8 08/15/2024 1542   PROT 6.6 08/15/2024 1542   ALBUMIN 3.2 (L) 08/15/2024 1542    RADIOGRAPHIC STUDIES: CT ABDOMEN PELVIS W CONTRAST Result Date: 08/15/2024 EXAM: CT ABDOMEN AND PELVIS WITH CONTRAST 08/15/2024 07:36:45 PM TECHNIQUE: CT of the abdomen and pelvis was performed with the administration of intravenous contrast. Multiplanar reformatted images are provided for review. Automated exposure control, iterative reconstruction, and/or weight-based adjustment of the mA/kV was utilized to reduce the radiation dose to as low as reasonably achievable. 100 mL of iohexol  (OMNIPAQUE ) 350 MG/ML injection was administered. COMPARISON: 07/15/2024. CLINICAL HISTORY: Abdominal trauma, blunt. FINDINGS: LOWER CHEST: See chest CT report today. LIVER: Small an ill-defined lucent lesion centrally in the liver measures 9 mm, cannot exclude metastasis. Previously seen area of hypermetabolic activity in the right hepatic lobe on prior PET not as well visualized. There is a subtle low-density area in the general region measuring 1.5 cm which may correspond to the area of abnormal activity on prior PET CT. GALLBLADDER AND BILE DUCTS: Prior cholecystectomy. No biliary ductal dilatation. SPLEEN: No acute abnormality. PANCREAS: No acute abnormality. ADRENAL GLANDS: No acute abnormality. KIDNEYS, URETERS AND BLADDER: No stones in the kidneys or ureters. No hydronephrosis. No perinephric or periureteral stranding. Small layering high-density material posteriorly in the urinary bladder possibly layering contrast  or stones. GI AND  BOWEL: Stomach demonstrates no acute abnormality. There is no bowel obstruction. PERITONEUM AND RETROPERITONEUM: No ascites. No free air. VASCULATURE: Aorta is normal in caliber. LYMPH NODES: No lymphadenopathy. REPRODUCTIVE ORGANS: No acute abnormality. BONES AND SOFT TISSUES: Large destructive right iliac bone mass with adjacent soft tissue mass measuring 7.1 x 5.0 cm again noted, similar to prior study. Heterogeneous appearance of the bones with probable lytic metastases throughout the lumbar spine and in the left iliac crest. No focal soft tissue abnormality. IMPRESSION: 1. Large destructive right iliac bone mass with adjacent soft tissue component measuring 7.1 x 5.0 cm, unchanged from prior study, compatible with metastatic disease. 2. Probable lytic osseous metastases throughout the lumbar spine and in the left iliac crest. 3. Two hepatic lesions suspicious for metastases, including a 9 mm ill-defined central hepatic lesion and a subtle 1.5 cm low-density focus in the right hepatic lobe that may correspond to prior PET-avid activity. Electronically signed by: Franky Crease MD 08/15/2024 08:00 PM EST RP Workstation: [id number]   CT L-SPINE NO CHARGE Result Date: 08/15/2024 EXAM: CT OF THE LUMBAR SPINE WITHOUT CONTRAST 08/15/2024 07:36:45 PM TECHNIQUE: CT of the lumbar spine was performed without the administration of intravenous contrast. Multiplanar reformatted images are provided for review. Automated exposure control, iterative reconstruction, and/or weight based adjustment of the mA/kV was utilized to reduce the radiation dose to as low as reasonably achievable. COMPARISON: 07/15/2024. CLINICAL HISTORY: FINDINGS: BONES AND ALIGNMENT: Normal vertebral body heights except for L1. Mild compression fracture involving the L1 vertebral body, similar to the prior study. Heterogeneous appearance of the L1 vertebral body with lucency throughout, likely signifying metastases and pathologic fracture. Other subtle  lucent areas throughout the remainder of the lumbar vertebrae, likely metastases although these are less well identified. Normal alignment. DEGENERATIVE CHANGES: Diffuse degenerative disc and facet disease. SOFT TISSUES: Soft tissue posterior to the L5 vertebral body could reflect extrusion L5-S1 disc fragment or epidural metastatic disease. IMPRESSION: 1. Mild compression fracture of the L1 vertebral body with heterogeneous appearance and lucency, likely signifying metastases and pathologic fracture, similar to prior study. 2. Additional subtle lucent lesions throughout the lumbar vertebrae, likely osseous metastases. 3. Soft tissue posterior to the L5 vertebral body, indeterminate between extruded L5-S1 disc fragment and epidural metastatic disease. This could be further evaluated with MRI if felt clinically indicated. Electronically signed by: Franky Crease MD 08/15/2024 07:54 PM EST RP Workstation: [id number]   CT T-SPINE NO CHARGE Result Date: 08/15/2024 EXAM: CT THORACIC SPINE WITHOUT CONTRAST 08/15/2024 07:36:45 PM TECHNIQUE: CT of the thoracic spine was performed without the administration of intravenous contrast. Multiplanar reformatted images are provided for review. Automated exposure control, iterative reconstruction, and/or weight based adjustment of the mA/kV was utilized to reduce the radiation dose to as low as reasonably achievable. COMPARISON: 07/15/2024. CLINICAL HISTORY: FINDINGS: BONES AND ALIGNMENT: Lucent lesions are present throughout the thoracic spine, compatible with metastases. This appears progressed since the prior study. Most extensive disease is noted within the T9 vertebral body, particularly the left side of the vertebral body. Normal alignment. DEGENERATIVE CHANGES: No significant degenerative changes. SOFT TISSUES: No acute abnormality. IMPRESSION: 1. Progression of lucent lesions throughout the lower cervical spine and thoracic spine compatible with worsening metastases, most  extensive at T9. Electronically signed by: Franky Crease MD 08/15/2024 07:50 PM EST RP Workstation: [id number]   CT Angio Chest Pulmonary Embolism (PE) W or WO Contrast Result Date: 08/15/2024 EXAM: CTA of the Chest with contrast for PE 08/15/2024 07:36:45  PM TECHNIQUE: CTA of the chest was performed after the administration of intravenous contrast. Multiplanar reformatted images are provided for review. MIP images are provided for review. Automated exposure control, iterative reconstruction, and/or weight based adjustment of the mA/kV was utilized to reduce the radiation dose to as low as reasonably achievable. COMPARISON: 07/15/2024, PET CT: 07/22/2024. CLINICAL HISTORY: Pulmonary embolism (PE) suspected, high prob. FINDINGS: PULMONARY ARTERIES: Pulmonary arteries are adequately opacified for evaluation. No pulmonary embolism. Main pulmonary artery is normal in caliber. MEDIASTINUM: Cardiomegaly. Prior CABG. Aortic atherosclerosis. LYMPH NODES: No mediastinal, hilar or axillary lymphadenopathy. LUNGS AND PLEURA: Bilateral calcified pleural plaques. Fluid noted in the major fissure. Worsening airspace disease in the right lower lobe surrounding the previously seen right lower lobe peripheral mass. While a component of this could reflect worsening/enlarging malignancy, much of this appears to reflect airspace disease, possible pneumonia. No pneumothorax. UPPER ABDOMEN: Limited images of the upper abdomen are unremarkable. SOFT TISSUES AND BONES: Extensive osseous metastatic disease throughout the spine. This is worsening since the prior study, most notable at the T9 level. No acute soft tissue abnormality. IMPRESSION: 1. No evidence of pulmonary embolus. 2. Worsening right lower lobe airspace disease surrounding the known peripheral mass, which may represent pneumonia or progression of malignancy. 3. Extensive osseous metastatic disease throughout the spine, progressed from the prior study, most notable at T9.  Electronically signed by: Franky Crease MD 08/15/2024 07:47 PM EST RP Workstation: [id number]   CT CERVICAL SPINE WO CONTRAST Result Date: 08/15/2024 CLINICAL DATA:  Lung cancer, multiple falls, increased confusion EXAM: CT CERVICAL SPINE WITHOUT CONTRAST TECHNIQUE: Multidetector CT imaging of the cervical spine was performed without intravenous contrast. Multiplanar CT image reconstructions were also generated. RADIATION DOSE REDUCTION: This exam was performed according to the departmental dose-optimization program which includes automated exposure control, adjustment of the mA and/or kV according to patient size and/or use of iterative reconstruction technique. COMPARISON:  07/22/2024 FINDINGS: Alignment: Degenerative anterolisthesis of C7 on T1 measuring 4-5 mm. Otherwise alignment is anatomic. Skull base and vertebrae: There are numerous lytic lesions throughout the visualized cervical and thoracic vertebral bodies, with mild chronic wedging of the C7 vertebral body consistent with chronic pathologic fracture. This is unchanged since the recent PET scan. There are no acute displaced fractures identified. Soft tissues and spinal canal: No prevertebral fluid or swelling. No visible canal hematoma. Disc levels: There is severe facet hypertrophy throughout the cervical spine. Multilevel disc space narrowing greatest at the C5-6, C6-7, and C7-T1 levels. Mild central canal stenosis at C5-6, C6-7, and C7-T1 due to the degenerative changes and anterolisthesis described above. Upper chest: Airway is patent. Visualized portions of the lung apices are clear. Other: Reconstructed images demonstrate no additional findings. IMPRESSION: 1. No acute cervical spine fracture. 2. Diffuse bony metastases throughout all visualized cervical and thoracic vertebral bodies, with stable chronic pathologic compression fracture at C7. No change since the recent PET scan. Electronically Signed   By: Ozell Daring M.D.   On: 08/15/2024  17:06   CT HEAD WO CONTRAST Result Date: 08/15/2024 CLINICAL DATA:  Lung cancer, fell this morning, increased confusion, history of subdural hematoma on 08/11/2024 exam EXAM: CT HEAD WITHOUT CONTRAST TECHNIQUE: Contiguous axial images were obtained from the base of the skull through the vertex without intravenous contrast. RADIATION DOSE REDUCTION: This exam was performed according to the departmental dose-optimization program which includes automated exposure control, adjustment of the mA and/or kV according to patient size and/or use of iterative reconstruction technique. COMPARISON:  08/11/2024  FINDINGS: Brain: Chronic appearing subdural hematoma along the right frontal convexity, measuring up to 7 mm in maximal thickness. No evidence of acute hemorrhage. Stable mild effacement of the right lateral ventricle without significant midline shift. No evidence of acute infarct. The lateral ventricles and midline structures are otherwise unremarkable. Vascular: No hyperdense vessel or unexpected calcification. Skull: Normal. Negative for fracture or focal lesion. Sinuses/Orbits: No acute finding. Other: None. IMPRESSION: 1. Chronic appearing right frontal subdural hematoma measuring up to 7 mm, previously having measured 5-6 mm. Stable mild effacement of the right lateral ventricle without midline shift. 2. No acute infarct or hemorrhage. Electronically Signed   By: Ozell Daring M.D.   On: 08/15/2024 17:02   MR Brain W Wo Contrast Result Date: 08/11/2024 EXAM: MRI BRAIN WITH AND WITHOUT CONTRAST 08/11/2024 09:32:31 AM TECHNIQUE: Multiplanar multisequence MRI of the head/brain was performed with and without the administration of intravenous contrast. 7 mL gadobutrol  (GADAVIST ) 1 MMOL/ML injection. COMPARISON: PET CT 07/22/2024. CLINICAL HISTORY: 89 year old male with recently diagnosed lung mass, metastatic disease evaluation for NSCLC, and also reports a recent fall. FINDINGS: BRAIN AND VENTRICLES: Right side  subdural hematoma (series 9 image 41 and coronal series 17 image 16), measures 5 to 6 mm maximal thickness over the superior right hemisphere convexity. There is mild associated mass effect on the right lateral ventricle. Trace leftward midline shift (series 9 image 31). Subdural blood products, mild susceptibility artifact on diffusion imaging. No other acute intracranial hemorrhage identified. Normal basilar cisterns. No superimposed acute infarct. No ventriculomegaly. Following contrast, the major dural venous sinuses are enhancing and appear patent. There is reactive right hemisphere pachymeningeal thickening and enhancement (series 19 image 14). No other dural abnormality is identified. No other abnormal intracranial enhancement is identified. Mild for age periventricular and scattered cerebral white matter T2 and FLAIR hyperintensity, most commonly due to chronic small vessel disease. No cortical encephalomalacia identified. Deep gray nuclei, brainstem and cerebellum appear negative. The sella is unremarkable. Normal flow voids. ORBITS: No acute abnormality. SINUSES: No acute abnormality. BONES AND SOFT TISSUES: Normal bone marrow signal and enhancement. Partially visible cervical spine degeneration. No acute soft tissue abnormality. IMPRESSION: 1. Right subdural hematoma (56 mm) with mild mass effect on the right lateral ventricle and trace leftward midline shift. From the MRI department, I instructed this patient be escorted to the emergency department for further evaluation and management. 2. No metastatic disease identified. No other acute intracranial abnormality. Electronically signed by: Helayne Hurst MD 08/11/2024 10:02 AM EST RP Workstation: [id number]   CT BONE TROCAR/NEEDLE BIOPSY SUPERFICIAL Result Date: 07/27/2024 INDICATION: 89 year old with right lung mass and lytic bone lesions. Tissue diagnosis is needed. EXAM: CT-guided core biopsy of right iliac lytic bone lesion TECHNIQUE: Multidetector  CT imaging of the pelvis was performed following the standard protocol without IV contrast. RADIATION DOSE REDUCTION: This exam was performed according to the departmental dose-optimization program which includes automated exposure control, adjustment of the mA and/or kV according to patient size and/or use of iterative reconstruction technique. MEDICATIONS: Moderate sedation ANESTHESIA/SEDATION: Moderate (conscious) sedation was employed during this procedure. A total of Versed  1 mg and Fentanyl  25 mcg was administered intravenously by the radiology nurse. Total intra-service moderate Sedation Time: 10 minutes. The patient's level of consciousness and vital signs were monitored continuously by radiology nursing throughout the procedure under my direct supervision. COMPLICATIONS: None immediate. PROCEDURE: Informed written consent was obtained from the patient after a thorough discussion of the procedural risks, benefits and alternatives. All questions were  addressed. A timeout was performed prior to the initiation of the procedure. Patient was placed prone. CT images through the pelvis were obtained. Large lytic bone lesion involving the right iliac wing was identified and targeted. Posterior lower back was prepped with chlorhexidine and sterile field was created. Skin was anesthetized using 1% lidocaine . Small incision was made. Using CT guidance, 17 gauge coaxial needle was directed into the lytic bone lesion. Multiple core biopsies were obtained with an 18 gauge core device. Specimens placed in formalin. 17 gauge needle was removed. Follow up CT images were obtained. Bandage placed over the puncture site. FINDINGS: Large destructive soft tissue mass involving the right iliac wing. Biopsy needle confirmed within the soft tissue mass. IMPRESSION: CT-guided core biopsy of the large lytic lesion involving the right iliac bone. Electronically Signed   By: Juliene Balder M.D.   On: 07/27/2024 12:50   NM PET Image  Initial (PI) Skull Base To Thigh Result Date: 07/22/2024 CLINICAL DATA:  Initial treatment strategy for right lung mass with clinical concern for bronchogenic carcinoma and rib metastasis. EXAM: NUCLEAR MEDICINE PET SKULL BASE TO THIGH TECHNIQUE: 8.69 mCi F-18 FDG was injected intravenously. Full-ring PET imaging was performed from the skull base to thigh after the radiotracer. CT data was obtained and used for attenuation correction and anatomic localization. Fasting blood glucose: 108 mg/dl COMPARISON:  CT of the chest, abdomen and pelvis 07/15/2024. FINDINGS: Mediastinal blood pool activity: SUV max 1.9 NECK: No hypermetabolic cervical lymph nodes are identified. No suspicious activity identified within the pharyngeal mucosal space. Incidental CT findings: Bilateral carotid atherosclerosis. CHEST: There are no hypermetabolic mediastinal, hilar or axillary lymph nodes. Previously demonstrated mass laterally in the right lower lobe is hypermetabolic. This lesion measures 5.9 x 4.2 cm on image 62/6 and has an SUV max of 9.7. No other hypermetabolic pulmonary activity or suspicious nodularity. There is no hypermetabolic activity associated with a small right pleural effusion. Incidental CT findings: Underlying calcified pleural plaque formation bilaterally with small bilateral pleural effusions. Pleural fluid extends into the right major fissure. Underlying chronic obstructive lung disease with diffuse central airway thickening and bibasilar atelectasis. Diffuse atherosclerosis of the aorta, great vessels and coronary arteries post median sternotomy and CABG. ABDOMEN/PELVIS: Hypermetabolic activity centrally in the right hepatic lobe suspicious for metastatic disease (SUV max 5.2). Corresponding subtle hypodense lesion measuring 2.3 cm on image 65/6. Possible additional smaller metastasis more medially near the caudate lobe. There is focal hypermetabolic activity within the left adrenal gland (SUV max 4.5) without  corresponding measurable mass on the CT images. No hypermetabolic activity within the right adrenal gland, pancreas, spleen or bowel. There is no hypermetabolic nodal activity in the abdomen or pelvis. Incidental CT findings: Status post cholecystectomy. Aortic and branch vessel atherosclerosis. Mild diverticular changes within the distal colon. SKELETON: Widespread hypermetabolic osseous metastatic disease. There is a large lytic lesion in the right iliac crest measuring 5.3 x 4.3 cm on image 97/6 with an SUV max of 13.3. There are additional lesions within the right scapula, sternum, multiple ribs, multiple thoracolumbar vertebral bodies, throughout the bony pelvis and in the proximal left femur. As seen on prior CT, fracture of the L1 vertebral body, potentially pathologic. Given the extent of spinal involvement, patient at risk for additional pathologic fractures. Incidental CT findings: Multilevel spondylosis. Healed median sternotomy. IMPRESSION: 1. Hypermetabolic right lower lobe pulmonary mass consistent with primary bronchogenic carcinoma. 2. Widespread hypermetabolic osseous metastatic disease with possible L1 pathologic fracture. There is a large lytic  lesion in the right iliac crest. Given the extent of spinal involvement, patient at risk for additional pathologic fractures. 3. Hypermetabolic activity centrally in the right hepatic lobe suspicious for metastatic disease. 4. Focal hypermetabolic activity within the left adrenal gland suspicious for metastatic disease. 5. Small bilateral pleural effusions without hypermetabolic activity. 6.  Aortic Atherosclerosis (ICD10-I70.0). Electronically Signed   By: Elsie Perone M.D.   On: 07/22/2024 14:22   DG Chest Portable 1 View Result Date: 07/18/2024 EXAM: 1 AP VIEW XRAY OF THE CHEST 07/18/2024 05:42:00 PM COMPARISON: None available. CLINICAL HISTORY: SOB. FINDINGS: LUNGS AND PLEURA: Hypoinflation. Patchy opacities in lung bases, atelectasis versus  pneumonia. Calcified left pleural plaque. Pulmonary vascular congestion. No pulmonary edema. No pleural effusion. No pneumothorax. HEART AND MEDIASTINUM: Cardiomegaly. Aortic arch atherosclerosis. CABG markers noted. BONES AND SOFT TISSUES: Sternotomy wires noted. No acute osseous abnormality. IMPRESSION: 1. Patchy bibasilar opacities, atelectasis versus pneumonia. 2. Cardiomegaly with pulmonary vascular congestion. 3. Calcified left pleural plaque. 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  Patient with pending initiation dose reduced CarboTaxol but was admitted to the hospital with altered mental status.  Patient seen in the emergency department.  He remains confused and unable to participate meaningfully in conversation regarding goals.  CT of the head showed chronic right frontal subdural hematoma.  I met with patient's wife.  She describes a pattern of rapid decline at home.  Patient had become profoundly weak.  Recent confusion.  She verbalized agreement with current scope of treatment and would like to see if patient's delirium can be treated.  However, she suggests that patient has declined to the point where she does not feel that chemotherapy would be in his best interest.  She asked about hospice involvement, which we discussed in detail.  I did recommend hospice involvement at home.  However, hospice IPU might also be an option in the event of further decline.  Wife confirms  DNR/DNI.  PLAN: - Continue current scope of treatment - Hospice liaison to coordinate hospice care at time of discharge - DNR/DNI  Case and plan discussed with Dr. Jacobo  Time Total: 50 minutes  Visit consisted of counseling and education dealing with the complex and emotionally intense issues of symptom management and palliative care in the setting of serious and potentially life-threatening illness.Greater than 50%  of this time was spent counseling and coordinating care related to the above assessment and plan.  Signed by: Fonda Mower, PhD, NP-C

## 2024-08-16 NOTE — Progress Notes (Signed)
   Brief Progress Note   _____________________________________________________________________________________________________________  Patient Name: Barry Small Patient DOB: October 09, 1933 Date: @TODAY @      Data: Reviewed labs, vital signs, and notes.    Action: No action required at this time.    Response:    _____________________________________________________________________________________________________________  The [facility] RN Expeditor [name] S [name] Please contact us  directly via secure chat (search for [facility]) or by calling us  at [phone] [facility]).

## 2024-08-16 NOTE — TOC Initial Note (Signed)
 Transition of Care [facility]) - Initial/Assessment Note    Patient Details  Name: Barry Small MRN: [id number] Date of Birth: August 04, 1934  Transition of Care [facility] CM/SW Contact:    Nathanael CHRISTELLA Ring, RN Phone Number: 08/16/2024, 2:57 PM  Clinical Narrative:                 CM received a message that patient's wife is interested in hospice at home with [facility].  They have volunteered with [facility] in the past.  Patient has cancer with mets to the brain.  Fonda Mower Palliative NP with oncology talked with them and told them that patient was probably not a candidate for chemotherapy.  CM met with wife and patient at the bedside, introduced self and explained role in DC planning.  Wife seems concerned and overwhelmed with how confused and delirious her husband is and how rapidly is onset.  He is lying in the bed talking nonsensical and talking to people that are not there.  She is interested in hospice at home but not definitive that is what she wants yet.   She drives and thinks that she can care for him at home.  He has been walking with a walker, he has had to falls.  She has 2 daughters, one lives in [place] the other is a archivist in [place] for the [employer].  She has 2 grandsons but she cannot depend on them for a lot of help. [facility] will be putting in a ramp at their home. TOC will cont to follow.   Expected Discharge Plan: Home w Hospice Care Barriers to Discharge: Continued Medical Work up   Patient Goals and CMS Choice Patient states their goals for this hospitalization and ongoing recovery are:: Wife is unsure at this time as to what she wants was told he was probably not a candidate for Chemo CMS Medicare.gov Compare Post Acute Care list provided to:: Patient Represenative (must comment) Choice offered to / list presented to : Spouse      Expected Discharge Plan and Services   Discharge Planning Services: CM Consult   Living arrangements for the past 2  months: Single Family Home                                      Prior Living Arrangements/Services Living arrangements for the past 2 months: Single Family Home Lives with:: Spouse Patient language and need for interpreter reviewed:: Yes Do you feel safe going back to the place where you live?: Yes      Need for Family Participation in Patient Care: Yes (Comment) Care giver support system in place?: Yes (comment) Current home services: DME (RW, lift chair, shower chair) Criminal Activity/Legal Involvement Pertinent to Current Situation/Hospitalization: No - Comment as needed  Activities of Daily Living      Permission Sought/Granted Permission sought to share information with : Family Supports Permission granted to share information with : Yes, Verbal Permission Granted  Share Information with NAME: Zakari Bathe  Permission granted to share info w AGENCY: [facility]  Permission granted to share info w Relationship: Spouse  Permission granted to share info w Contact Information: [phone]  Emotional Assessment Appearance:: Appears stated age Attitude/Demeanor/Rapport: Self-Absorbed Affect (typically observed): Other (comment)   Alcohol / Substance Use: Not Applicable Psych Involvement: No (comment)  Admission diagnosis:  Acute metabolic encephalopathy [G93.41] Patient Active Problem List  Diagnosis Date Noted   Palliative care encounter 08/16/2024   Acute metabolic encephalopathy 08/15/2024   Fall at home, initial encounter 08/15/2024   Lung cancer metastatic to bone_ to spin and liver 08/15/2024   Chronic combined systolic and diastolic CHF (congestive heart failure) (HCC) 08/15/2024   SDH (subdural hematoma) (HCC) 08/15/2024   Leukocytosis 08/15/2024   Squamous cell carcinoma lung, right (HCC) 08/05/2024   HTN (hypertension) 07/19/2024   Tachycardia 07/18/2024   CAP (community acquired pneumonia) 07/18/2024   CAD (coronary artery disease) 07/18/2024    HLD (hyperlipidemia) 07/18/2024   Myocardial injury 07/18/2024   Acute on chronic combined systolic and diastolic CHF (congestive heart failure) (HCC) 07/18/2024   Lung mass 07/18/2024   Back pain 07/03/2024   Bigeminy 04/10/2024   Hyponatremia 04/10/2024   Atherosclerosis of native coronary artery of native heart with stable angina pectoris 10/03/2018   Constipation 08/18/2018   Health care maintenance 01/28/2018   Ptosis, bilateral 12/15/2017   Visual field defect 12/15/2017   Hx of CABG 06/16/2016   Advance care planning 11/01/2014   Tremor 10/21/2013   Medicare annual wellness visit, subsequent 07/30/2012   Barrett's esophagus 11/01/2008   CHRONIC RHINITIS 05/10/2008   SHOULDER PAIN, RIGHT 05/28/2007   HYPERCHOLESTEROLEMIA 05/19/2007   Disorder of bilirubin excretion 05/19/2007   Essential hypertension 05/19/2007   Coronary atherosclerosis 05/19/2007   GERD 05/19/2007   HYPERGLYCEMIA 05/19/2007   PCP:  [name], MD Pharmacy:   [facility] [phone] [address], [place] - [address] [place] [address] Phone: [phone] Fax: [phone]     Social Drivers of Health (SDOH) Social History: SDOH Screenings   Food Insecurity: No Food Insecurity (07/19/2024)  Housing: Unknown (07/19/2024)  Transportation Needs: No Transportation Needs (07/19/2024)  Utilities: Not At Risk (07/19/2024)  Alcohol Screen: Low Risk  (06/29/2024)  Depression (PHQ2-9): Low Risk  (08/10/2024)  Recent Concern: Depression (PHQ2-9) - High Risk (07/18/2024)  Financial Resource Strain: Low Risk  (06/29/2024)  Physical Activity: Inactive (06/29/2024)  Social Connections: Moderately Integrated (07/19/2024)  Stress: Stress Concern Present (06/29/2024)  Tobacco Use: Low Risk  (08/15/2024)  Health Literacy: Adequate Health Literacy (03/17/2024)   SDOH Interventions:     Readmission Risk Interventions    08/16/2024    2:54 PM  Readmission Risk Prevention Plan   Transportation Screening Complete  PCP or Specialist Appt within 3-5 Days Complete  HRI or Home Care Consult Complete  Social Work Consult for Recovery Care Planning/Counseling Not Complete  SW consult not completed comments no counseling  Palliative Care Screening Complete  Medication Review Oceanographer) Complete

## 2024-08-16 NOTE — Progress Notes (Incomplete)
 [facility] ED [facility] [employer] Liaison Note  Received request from [place], Care Manager, for hospice services at home after discharge. Spoke with (person's name) to initiate education related to hospice philosophy, services, and team approach to care. Patient/family verbalized understanding of information given.   Per discussion, the plan is for discharge home by PTAR/EMS or private vehicle on (Date of discharge). DME needs discussed. Patient has the following equipment in the home:  Patient/family requests the following equipment for delivery:   The address has been verified and is correct in the chart. Contact name and phone number here is the family contact to arrange time of equipment delivery. Please send signed and completed DNR home with patient/family. Please provide prescriptions at discharge as needed to ensure ongoing symptom management.  [facility] information and contact numbers given to: Above information shared with Name, Care Manager. Please call with any questions or concerns. Thank you for the opportunity to participate in this patient's care.  Eleanor Nail, LPN [facility] Liaison [phone]

## 2024-08-16 NOTE — Progress Notes (Signed)
 OT Cancellation Note  Patient Details Name: Barry Small MRN: [id number] DOB: 01/16/1934   Cancelled Treatment:    Reason Eval/Treat Not Completed: Medical issues which prohibited therapy. Orders received, chart reviewed. Pt with metastatic disease with compression fx at C7, L1, lesions in sternum, R scapula and L femur. Messaged DO regarding mobility restrictions and potential weight bearing precautions, including potential TLSO (noted pt to have soft cervical collar ordered). Will await guidance before attempting to mobilize patient.   Jaxan Michel L. Perry Brucato, OTR/L  08/16/24, 10:24 AM

## 2024-08-16 NOTE — Progress Notes (Signed)
 PT Cancellation Note  Patient Details Name: Barry Small MRN: [id number] DOB: 1934-05-21   Cancelled Treatment:    Reason Eval/Treat Not Completed: PT screened, no needs identified, will sign off (Chart reviewed- appears pt declining, very ill at this point, will likely have hosptce services in place at DC. No acute PT services indicated at this time.)  3:12 PM, 08/16/24 Peggye JAYSON Linear, PT, DPT Physical Therapist - [facility]  [phone] (ASCOM)     Leith Hedlund C 08/16/2024, 3:12 PM

## 2024-08-16 NOTE — Progress Notes (Signed)
 Progress Note   Patient: Barry Small FMW:[id number] DOB: August 29, 1934 DOA: 08/15/2024     1 DOS: the patient was seen and examined on 08/16/2024   Brief hospital course: Barry Small is a 89 y.o. 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 The plan is to place [facility] for chemotherapy and also plan to start radiation therapy soon. Per his wife, the procedure for port placement was canceled today since patient was found to be confused earlier today. Patient's wife states that pt got up today and had another fall in the bathroom.  States that since having the fall has been more confused than normal.  Per his wife, patient was recently started on Norco for pain and has been more confused and altered since that time. His wife stated that pt also started having visual hallucinations since being in the emergency department. Patient has generalized weakness.  He has pain in his whole spine, worst in the middle of the spine. Patient has been constipated, no vomiting, diarrhea or abdominal pain.  He has dry cough, no SOB or chest pain.  No fever or chills.  No symptoms of UTI.  He is constipated. When I saw pt in ED, pt is confused, knows his own name, knows that he is in hospital, intermittently confused about the time.   Data reviewed independently and ED Course: pt was found to have WBC 24.6, lactic acid of 1.8, GFR> 60, temperature normal, blood pressure 138/68, heart rate 122 --> 110, RR 20, oxygen saturation 93-98% on room air....   Extensive imaging was obtained including CT head showing stable right frontal subdural hematoma  and no acute infarct or  hemorrhage.  CT scans of C-spine, T-spine, L-spine were non-acute but showed diffuse bony metastatic disease in all cervical and thoracic vertebral bodies and chronic stable C7 compression fracture, progression of lucent lesions in lower C and T spine most extensive at T9.  L1 compression and lucency consistent with pathologic fracture with additional subtle lucent lesions throughout the lumbar vertebrae.  Soft tissue posterior to the L5 vertebral body, indeterminate between extruded L5-S1 disc fragment and epidural metastatic disease.   CT abdomen/pelvis showed large destructive right iliac bone mass with adjacent soft tissue component, lytic bone lesions through lumbar spine and left iliac crest, two hepatic lesions.  CTA chest showed no PE, but worsening RLLL airspace disease surrounding known peripheral mass (PNA versus progression of malignancy).   Patient was admitted to the hospital and started on IV Unasyn  empirically for possible aspiration pneumonia.    Oncology and Palliative Care consulted.  08/16/24 - I assumed care.  Blood culture obtained on admission growing Aerocuccus species.  ID consulted. Palliative care met with patient's wife.  Pt has rapidly declined at home, profoundly weak, and wife reconsidering chemotherapy, interested in hospice.  Hospice liaison is now on board.     Assessment and Plan:  Acute metabolic encephalopathy: Etiology is not clear.  Likely multifactorial etiology, including metastasized cancer, pain, hyponatremia, SDH and Norco use. Will also need to r/o UTI.  --scheduled tylenol  for pain --Norco is on hold --Will defer pain control to palliative care -- Frequent neurocheck --Fall precaution - give  Sodium chloride  for hyponatremia - IV fluid: Normal saline 500 cc ED - Follow-up for UA - peding collection   Squamous cell carcinoma lung, right and lung cancer metastatic to bone_ to spin and liver and iliac bone: -consulted Dr. Rennie of oncology -  Palliative consulted - Hospice planned at discharge given patient's recent rapid decline - soft C-collar (pt has stable chronic pathologic compression fracture at C7). - Scheduled Tylenol  for pain since holding Norco for encephalopathy - Lidoderm  patch to middle spin   Fall at home, initial encounter -Fall precaution -PT/OT   Chronic combined systolic and diastolic CHF (congestive heart failure) (HCC): 2D echo 05/19/2024 showed EF of 45-50% with grade 2 diastolic dysfunction.  Patient has trace leg edema, but no SOB, no JVD, does not seem to have CHF exacerbation. --Monitor volume status closely   Hyponatremia: Sodium 132 -Sodium chloride  1 g twice daily - Normal saline 500 cc in the ED   HTN (hypertension) -IV hydralazine  as needed - Continue home metoprolol    SDH (subdural hematoma) (HCC): CT of head showed chronic appearing right frontal subdural hematoma measuring up to 7 mm, previously having measured 5-6 mm. Stable mild effacement of the right lateral ventricle without midline shift. -hold ASA - Frequent neurocheck   CAD (coronary artery disease): S/p of CABG. -Hold aspirin  - Continue Crestor    Leukocytosis: Patient has chronic leukocytosis.  WBC 19.6 on 07/25/2024 and 25.2 on 08/11/24.  Today he is WBC 24.6.  Likely reactive.  CT showed worsening right lower lobe airspace disease surrounding the known peripheral mass, which may represent pneumonia or progression of malignancy.  Cannot completely rule out possibility of aspiration pneumonia. - Continue Unasyn  - Monitor CBC - Follow-up blood culture and sputum culture   HLD (hyperlipidemia) -Crestor       Subjective: Pt seen in the ED holding for a bed this AM. He was awake, legs partly dangling off bed.  Confused but calm.  He denies complaints except for being uncomfortable, wanting legs repositioned.  No family in room during my visit.    Physical Exam: Vitals:   08/16/24 1045 08/16/24 1100 08/16/24 1101 08/16/24  1148  BP:  (!) 118/52 (!) 118/52   Pulse: 92 79 91   Resp: 18 (!) 24 (!) 26   Temp:    (!) 97.5 F (36.4 C)  TempSrc:    Oral  SpO2: 96% 96% 96%    General exam: awake, alert, no acute distress HEENT: moist mucus membranes, hearing grossly normal  Respiratory system: CTAB but generally diminished, no wheezes, normal respiratory effort. Cardiovascular system: normal S1/S2, RRR,  no pedal edema.   Gastrointestinal system: soft, NT, ND,  +bowel sounds. Central nervous system: exam limited by patient's confusion, not reliably following commands, normal speech, moves all extremities, grossly non-focal exam Extremities: moves all, no edema, normal tone Skin: dry, intact, normal temperature Psychiatry: normal mood, congruent affect, abnormal judgement and insight    Data Reviewed:  Notable labs --   Na 133 Cr 0.32 Ca 8.3 Procal 0.14 WBC 23.3 from 24.6  Blood culture growing Aerococcus species  - pending  Family Communication: None present on rounds. Will attempt to call as time allows.  Disposition: Status is: Inpatient Remains inpatient appropriate because: ongoing evaluation, on IV antibiotics,  Planned Discharge Destination: Hospice - venue to be determined    Time spent: 45 minutes  Author: Burnard DELENA Cunning, DO 08/16/2024 2:54 PM  For on call review [email or web address].

## 2024-08-16 NOTE — Progress Notes (Signed)
 [facility] Liaison Note  Referral received from [facility], Jeanna Creech, RN, Transitions of Care Manager.  Met with patient and spouse at the bedside. Spouse requested that HL follow up with her tomorrow.  Hospital team aware.    Please call with any hospice related questions or concerns.  Thank you for the opportunity to participate in this patient's care.  [facility] [phone]

## 2024-08-17 ENCOUNTER — Inpatient Hospital Stay

## 2024-08-17 ENCOUNTER — Inpatient Hospital Stay: Admitting: Oncology

## 2024-08-17 ENCOUNTER — Ambulatory Visit

## 2024-08-17 LAB — MAGNESIUM: Magnesium: 2 mg/dL (ref 1.7–2.4)

## 2024-08-17 LAB — BASIC METABOLIC PANEL WITH GFR
Anion gap: 14 (ref 5–15)
BUN: 12 mg/dL (ref 8–23)
CO2: 22 mmol/L (ref 22–32)
Calcium: 8.8 mg/dL — ABNORMAL LOW (ref 8.9–10.3)
Chloride: 102 mmol/L (ref 98–111)
Creatinine, Ser: 0.47 mg/dL — ABNORMAL LOW (ref 0.61–1.24)
GFR, Estimated: >60 mL/min (ref >60)
Glucose, Bld: 73 mg/dL (ref 70–99)
Potassium: 3.4 mmol/L — ABNORMAL LOW (ref 3.5–5.1)
Sodium: 138 mmol/L (ref 135–145)

## 2024-08-17 LAB — CBC
HCT: 34.9 % — ABNORMAL LOW (ref 39.0–52.0)
Hemoglobin: 11.4 g/dL — ABNORMAL LOW (ref 13.0–17.0)
MCH: 28.6 pg (ref 26.0–34.0)
MCHC: 32.7 g/dL (ref 30.0–36.0)
MCV: 87.5 fL (ref 80.0–100.0)
Platelets: 182 K/uL (ref 150–400)
RBC: 3.99 MIL/uL — ABNORMAL LOW (ref 4.22–5.81)
RDW: 18 % — ABNORMAL HIGH (ref 11.5–15.5)
WBC: 28.5 K/uL — ABNORMAL HIGH (ref 4.0–10.5)
nRBC: 0 % (ref 0.0–0.2)

## 2024-08-17 MED ORDER — POTASSIUM CHLORIDE CRYS ER 20 MEQ PO TBCR
40.0000 meq | EXTENDED_RELEASE_TABLET | Freq: Once | ORAL | Status: AC
  Administered 2024-08-17: 40 meq via ORAL
  Filled 2024-08-17: qty 2

## 2024-08-17 MED ORDER — HYDROCODONE-ACETAMINOPHEN 5-325 MG PO TABS
1.0000 | ORAL_TABLET | Freq: Four times a day (QID) | ORAL | Status: DC | PRN
  Administered 2024-08-17 – 2024-08-18 (×2): 1 via ORAL
  Filled 2024-08-17 (×3): qty 1

## 2024-08-17 NOTE — Progress Notes (Signed)
 Progress Note   Patient: Barry Small FMW:[id number] DOB: [birth year] DOA: 08/15/2024     2 DOS: the patient was seen and examined on 08/17/2024   Brief hospital course: Barry Small is a 89 y.o. 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 The plan is to place [facility] for chemotherapy and also plan to start radiation therapy soon. Per his wife, the procedure for port placement was canceled today since patient was found to be confused earlier today. Patient's wife states that pt got up today and had another fall in the bathroom.  States that since having the fall has been more confused than normal.  Per his wife, patient was recently started on Norco for pain and has been more confused and altered since that time. His wife stated that pt also started having visual hallucinations since being in the emergency department. Patient has generalized weakness.  He has pain in his whole spine, worst in the middle of the spine. Patient has been constipated, no vomiting, diarrhea or abdominal pain.  He has dry cough, no SOB or chest pain.  No fever or chills.  No symptoms of UTI.  He is constipated. When I saw pt in ED, pt is confused, knows his own name, knows that he is in hospital, intermittently confused about the time.   Data reviewed independently and ED Course: pt was found to have WBC 24.6, lactic acid of 1.8, GFR> 60, temperature normal, blood pressure 138/68, heart rate 122 --> 110, RR 20, oxygen saturation 93-98% on room air....   Extensive imaging was obtained including CT head showing stable right frontal subdural hematoma  and no acute infarct or  hemorrhage.  CT scans of C-spine, T-spine, L-spine were non-acute but showed diffuse bony metastatic disease in all cervical and thoracic vertebral bodies and chronic stable C7 compression fracture, progression of lucent lesions in lower C and T spine most extensive at T9.  L1 compression and lucency consistent with pathologic fracture with additional subtle lucent lesions throughout the lumbar vertebrae.  Soft tissue posterior to the L5 vertebral body, indeterminate between extruded L5-S1 disc fragment and epidural metastatic disease.   CT abdomen/pelvis showed large destructive right iliac bone mass with adjacent soft tissue component, lytic bone lesions through lumbar spine and left iliac crest, two hepatic lesions.  CTA chest showed no PE, but worsening RLLL airspace disease surrounding known peripheral mass (PNA versus progression of malignancy).   Patient was admitted to the hospital and started on IV Unasyn  empirically for possible aspiration pneumonia.    Oncology and Palliative Care consulted.  08/17/24 - I assumed care.  Previously blood culture showed Aerococcus which was lab error Palliative care met with patient's wife.  Pt has rapidly declined at home, profoundly weak, and wife reconsidering chemotherapy, interested in hospice.  Hospice liaison is now on board. Wife yet to decide if she would like to take patient home with home hospice vs hospice at facility  Assessment and Plan:  Acute metabolic encephalopathy: Etiology is not clear.  Likely multifactorial etiology, including metastasized cancer, pain, hyponatremia, SDH and Norco use. Will also need to r/o UTI.  --scheduled tylenol  for pain -- Resume Norco, leaning towards  hospice/comfort (discussed opioids may make his confusion worse) --Will defer pain control to palliative care -- Frequent neurocheck --Fall precaution - give  Sodium chloride  for hyponatremia - Follow-up for UA - peding collection   Squamous cell carcinoma lung,  right and lung cancer metastatic to bone_ to spin and liver and iliac bone: -consulted Dr. Rennie of oncology - Palliative consulted - Hospice planned at discharge given patient's recent rapid decline - soft C-collar (pt has stable chronic pathologic compression fracture at C7). - Scheduled Tylenol  for pain since holding Norco for encephalopathy - Lidoderm  patch to middle spin   Fall at home, initial encounter -Fall precaution   Chronic combined systolic and diastolic CHF (congestive heart failure) (HCC): 2D echo 05/19/2024 showed EF of 45-50% with grade 2 diastolic dysfunction.  Patient has trace leg edema, but no SOB, no JVD, does not seem to have CHF exacerbation. --Monitor volume status closely   Hyponatremia: resolved -Sodium chloride  1 g twice daily - Normal saline 500 cc in the ED  Hypokalemia - Mag normal - Monitor and replete as needed   HTN (hypertension) -IV hydralazine  as needed - Continue home metoprolol    SDH (subdural hematoma) (HCC): CT of head showed chronic appearing right frontal subdural hematoma measuring up to 7 mm, previously having measured 5-6 mm. Stable mild effacement of the right lateral ventricle without midline shift. -hold ASA - Frequent neurocheck   CAD (coronary artery disease): S/p of CABG. -Hold aspirin  - Continue Crestor    Leukocytosis: Patient has chronic leukocytosis.  WBC 19.6 on 07/25/2024 and 25.2 on 08/11/24.  Today he is WBC 28.5.  Likely reactive.  CT showed worsening right lower lobe airspace disease surrounding the known peripheral mass, which may represent pneumonia or progression of malignancy.  Cannot completely rule out possibility of aspiration pneumonia. - Continue  IV Unasyn , Will change to po tomorrow - Monitor CBC - Follow-up blood culture and sputum culture   HLD (hyperlipidemia) -Crestor   Constipation - Miralax    Subjective: Patient was examined at the bedside, wife present. Appears to be confused and having  hallucination Upon discussion with wife, would like to have Norco resumed for pain management.  Also discussed the possibility of opioids making encephalopathy worse   Physical Exam: Vitals:   08/16/24 2301 08/17/24 0410 08/17/24 0500 08/17/24 0726  BP: 117/65 (!) 143/72  115/77  Pulse: (!) 101 (!) 109  88  Resp: 20 19  18   Temp: 99 F (37.2 C) 98.2 F (36.8 C)  97.6 F (36.4 C)  TempSrc: Oral Oral  Axillary  SpO2:  94%    Weight: 65 kg  65 kg   Height: 5' 5 (1.651 m)      General exam: awake, alert, no acute distress HEENT: moist mucus membranes, hearing grossly normal  Respiratory system: CTAB but generally diminished, no wheezes, normal respiratory effort. Cardiovascular system: normal S1/S2, RRR,  no pedal edema.   Gastrointestinal system: soft, NT, ND,  +bowel sounds. Central nervous system: exam limited by patient's confusion, not reliably following commands, moves all extremities, grossly non-focal exam Extremities: moves all, no edema, normal tone Skin: dry, intact, normal temperature   Data Reviewed:  Labs and imaging reviewed  Family Communication: Discussed with wife at the bedside  Disposition: Status is: Inpatient Remains inpatient appropriate because: ongoing evaluation, on IV antibiotics,  Planned Discharge Destination: Hospice - venue to be determined    Time spent: 52 minutes  Author: Laree Lock, MD 08/17/2024 9:21 AM  For on call review [email or web address].

## 2024-08-17 NOTE — Plan of Care (Signed)

## 2024-08-17 NOTE — Progress Notes (Addendum)
 [facility] Liaison Note   Received request from [facility]  to meet with patient andfamily  to explain hospice services at home, LTC and  IPU. HL  met spouse and patient at bedside and provided extensive education for hospice services.  Spouse requested additional time to consider options before proceeding. All questions answered and no concerns voiced.   [facility] information and contact numbers given to family & above information shared with TOC.   Please call with any questions/concerns.    Thank you for the opportunity to participate in this patient's care.  [facility] Liaison [phone]

## 2024-08-17 NOTE — Care Management Important Message (Signed)
 Important Message  Patient Details  Name: Barry Small MRN: [id number] Date of Birth: 03-12-1934   Important Message Given:  Yes - Medicare IM     Barry Small 08/17/2024, 4:29 PM

## 2024-08-17 NOTE — Progress Notes (Signed)
 OT Cancellation Note  Patient Details Name: Barry Small MRN: [id number] DOB: 05-Jul-1934   Cancelled Treatment:    Reason Eval/Treat Not Completed: OT screened, no needs identified, will sign off. Chart reviewed, discussed with care team. Pt with rapid decline and potentially discharging home with hospice services. OT to sign off, please re-consult if new needs arise.   Monte Bronder L. Attallah Ontko, OTR/L  08/17/24, 8:33 AM

## 2024-08-18 ENCOUNTER — Inpatient Hospital Stay

## 2024-08-18 ENCOUNTER — Ambulatory Visit

## 2024-08-18 LAB — BASIC METABOLIC PANEL WITH GFR
Anion gap: 12 (ref 5–15)
BUN: 13 mg/dL (ref 8–23)
CO2: 26 mmol/L (ref 22–32)
Calcium: 8.4 mg/dL — ABNORMAL LOW (ref 8.9–10.3)
Chloride: 106 mmol/L (ref 98–111)
Creatinine, Ser: 0.49 mg/dL — ABNORMAL LOW (ref 0.61–1.24)
GFR, Estimated: >60 mL/min (ref >60)
Glucose, Bld: 81 mg/dL (ref 70–99)
Potassium: 3.4 mmol/L — ABNORMAL LOW (ref 3.5–5.1)
Sodium: 144 mmol/L (ref 135–145)

## 2024-08-18 LAB — CBC
HCT: 34.1 % — ABNORMAL LOW (ref 39.0–52.0)
Hemoglobin: 10.8 g/dL — ABNORMAL LOW (ref 13.0–17.0)
MCH: 28.4 pg (ref 26.0–34.0)
MCHC: 31.7 g/dL (ref 30.0–36.0)
MCV: 89.7 fL (ref 80.0–100.0)
Platelets: 159 K/uL (ref 150–400)
RBC: 3.8 MIL/uL — ABNORMAL LOW (ref 4.22–5.81)
RDW: 18.3 % — ABNORMAL HIGH (ref 11.5–15.5)
WBC: 30.5 K/uL — ABNORMAL HIGH (ref 4.0–10.5)
nRBC: 0 % (ref 0.0–0.2)

## 2024-08-18 LAB — GLUCOSE, CAPILLARY
Glucose-Capillary: 93 mg/dL (ref 70–99)
Glucose-Capillary: 136 mg/dL — ABNORMAL HIGH (ref 70–99)

## 2024-08-18 LAB — MAGNESIUM: Magnesium: 2 mg/dL (ref 1.7–2.4)

## 2024-08-18 MED ORDER — FUROSEMIDE 10 MG/ML IJ SOLN
20.0000 mg | Freq: Once | INTRAMUSCULAR | Status: AC
  Administered 2024-08-18: 20 mg via INTRAVENOUS
  Filled 2024-08-18: qty 2

## 2024-08-18 MED ORDER — METOPROLOL TARTRATE 5 MG/5ML IV SOLN
2.5000 mg | Freq: Once | INTRAVENOUS | Status: AC
  Administered 2024-08-19: 2.5 mg via INTRAVENOUS
  Filled 2024-08-18: qty 5

## 2024-08-18 MED ORDER — HALOPERIDOL LACTATE 5 MG/ML IJ SOLN
2.5000 mg | Freq: Once | INTRAMUSCULAR | Status: AC
  Administered 2024-08-18: 2.5 mg via INTRAVENOUS
  Filled 2024-08-18 (×2): qty 1

## 2024-08-18 MED ORDER — ACETAMINOPHEN 325 MG PO TABS: 650.0000 mg | ORAL_TABLET | Freq: Once | ORAL | Status: DC

## 2024-08-18 MED ORDER — CHLORHEXIDINE GLUCONATE CLOTH 2 % EX PADS
6.0000 | MEDICATED_PAD | Freq: Every day | CUTANEOUS | Status: DC
  Administered 2024-08-18: 6 via TOPICAL

## 2024-08-18 MED ORDER — POTASSIUM CHLORIDE CRYS ER 20 MEQ PO TBCR
40.0000 meq | EXTENDED_RELEASE_TABLET | Freq: Once | ORAL | Status: AC
  Administered 2024-08-18: 40 meq via ORAL
  Filled 2024-08-18: qty 2

## 2024-08-18 NOTE — Progress Notes (Addendum)
 Pt HR was up at 12-140's. PT was sating at 88% on RA and need to be placed on 3 liters oxygen. EKG completed. MD Mansy made aware.  Update 2019-2044: See new orders.  Update 2107: See new order.

## 2024-08-18 NOTE — Progress Notes (Signed)
 Wife requesting door be closed so patient can get some rest. Wife educated on why door should be left open due to patient being a high fall risk. Wife voiced understanding but still request door be closed.

## 2024-08-18 NOTE — Plan of Care (Signed)
  Problem: Nutrition: Goal: Adequate nutrition will be maintained Outcome: Progressing   Problem: Clinical Measurements: Goal: Respiratory complications will improve Outcome: Progressing   Problem: Pain Managment: Goal: General experience of comfort will improve and/or be controlled Outcome: Progressing   Problem: Safety: Goal: Ability to remain free from injury will improve Outcome: Progressing

## 2024-08-18 NOTE — Plan of Care (Signed)
  Problem: Education: Goal: Knowledge of General Education information will improve Description: Including pain rating scale, medication(s)/side effects and non-pharmacologic comfort measures Outcome: Progressing   Problem: Health Behavior/Discharge Planning: Goal: Ability to manage health-related needs will improve Outcome: Progressing   Problem: Clinical Measurements: Goal: Ability to maintain clinical measurements within normal limits will improve Outcome: Progressing Goal: Will remain free from infection Outcome: Progressing Goal: Diagnostic test results will improve Outcome: Progressing Goal: Respiratory complications will improve Outcome: Progressing   Problem: Activity: Goal: Risk for activity intolerance will decrease Outcome: Progressing   Problem: Nutrition: Goal: Adequate nutrition will be maintained Outcome: Progressing   Problem: Coping: Goal: Level of anxiety will decrease Outcome: Progressing   Problem: Elimination: Goal: Will not experience complications related to bowel motility Outcome: Progressing Goal: Will not experience complications related to urinary retention Outcome: Progressing   Problem: Pain Managment: Goal: General experience of comfort will improve and/or be controlled Outcome: Progressing   Problem: Safety: Goal: Ability to remain free from injury will improve Outcome: Progressing   Problem: Skin Integrity: Goal: Risk for impaired skin integrity will decrease Outcome: Progressing

## 2024-08-18 NOTE — Progress Notes (Signed)
 [facility] Room 250 [facility] Liaison Note  Hospital Liaison will follow peripherally at this time. Spouse has HL contact information.  Met with spouse yesterday, giving her an overview of hospice services.   Please call with any hospice related questions or concerns  Thank you for the opportunity to participate in this patient's care  [facility] Liaison [phone]

## 2024-08-18 NOTE — Progress Notes (Signed)
 Progress Note   Patient: Barry Small FMW:[id number] DOB: [birth year] DOA: 08/15/2024     3 DOS: the patient was seen and examined on 08/18/2024   Brief hospital course: MILAM ALLBAUGH is a 89 y.o. 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 The plan is to place [facility] for chemotherapy and also plan to start radiation therapy soon. Per his wife, the procedure for port placement was canceled today since patient was found to be confused earlier today. Patient's wife states that pt got up today and had another fall in the bathroom.  States that since having the fall has been more confused than normal.  Per his wife, patient was recently started on Norco for pain and has been more confused and altered since that time. His wife stated that pt also started having visual hallucinations since being in the emergency department. Patient has generalized weakness.  He has pain in his whole spine, worst in the middle of the spine. Patient has been constipated, no vomiting, diarrhea or abdominal pain.  He has dry cough, no SOB or chest pain.  No fever or chills.  No symptoms of UTI.  He is constipated. When I saw pt in ED, pt is confused, knows his own name, knows that he is in hospital, intermittently confused about the time.   Data reviewed independently and ED Course: pt was found to have WBC 24.6, lactic acid of 1.8, GFR> 60, temperature normal, blood pressure 138/68, heart rate 122 --> 110, RR 20, oxygen saturation 93-98% on room air....   Extensive imaging was obtained including CT head showing stable right frontal subdural hematoma  and no acute infarct or  hemorrhage.  CT scans of C-spine, T-spine, L-spine were non-acute but showed diffuse bony metastatic disease in all cervical and thoracic vertebral bodies and chronic stable C7 compression fracture, progression of lucent lesions in lower C and T spine most extensive at T9.  L1 compression and lucency consistent with pathologic fracture with additional subtle lucent lesions throughout the lumbar vertebrae.  Soft tissue posterior to the L5 vertebral body, indeterminate between extruded L5-S1 disc fragment and epidural metastatic disease.   CT abdomen/pelvis showed large destructive right iliac bone mass with adjacent soft tissue component, lytic bone lesions through lumbar spine and left iliac crest, two hepatic lesions.  CTA chest showed no PE, but worsening RLLL airspace disease surrounding known peripheral mass (PNA versus progression of malignancy).   Patient was admitted to the hospital and started on IV Unasyn  empirically for possible aspiration pneumonia.    Oncology and Palliative Care consulted.  Previously blood culture showed Aerococcus which was lab error Palliative care met with patient's wife.  Pt has rapidly declined at home, profoundly weak, and wife reconsidering chemotherapy, interested in hospice.  Hospice liaison is now on board. Wife yet to decide if she would like to take patient home with home hospice vs hospice at facility  Assessment and Plan:  Acute metabolic encephalopathy: Etiology is not clear.  Likely multifactorial etiology, including metastasized cancer, pain, hyponatremia, SDH and Norco use. Will also need to r/o UTI.  --scheduled tylenol  for pain -- Resume Norco, leaning towards hospice/comfort (discussed opioids may make his  confusion worse) --Fall precaution - Follow-up for UA - pending collection - SLP eval to avoid aspiration   Squamous cell carcinoma lung, right and lung cancer metastatic to bone_ to spin and liver and iliac bone: -consulted Dr. Rennie of  oncology - Palliative consulted - Hospice planned at discharge given patient's recent rapid decline - soft C-collar (pt has stable chronic pathologic compression fracture at C7). - Scheduled Tylenol  for pain since holding Norco for encephalopathy - Lidoderm  patch to middle spin   Fall at home, initial encounter -Fall precaution   Chronic combined systolic and diastolic CHF (congestive heart failure) (HCC): 2D echo 05/19/2024 showed EF of 45-50% with grade 2 diastolic dysfunction.  Patient has trace leg edema, but no SOB, no JVD, does not seem to have CHF exacerbation. --Monitor volume status closely   Hyponatremia: resolved - discontinue salt tabs  Hypokalemia - Mag normal - Monitor and replete as needed   HTN (hypertension) -IV hydralazine  as needed - Continue home metoprolol    SDH (subdural hematoma) (HCC): CT of head showed chronic appearing right frontal subdural hematoma measuring up to 7 mm, previously having measured 5-6 mm. Stable mild effacement of the right lateral ventricle without midline shift. -hold ASA - Frequent neurocheck   CAD (coronary artery disease): S/p of CABG. -Hold aspirin  - Continue Crestor    Leukocytosis Possible aspiration pneumonia - Patient has chronic leukocytosis.  WBC 19.6 on 07/25/2024, uptrending 30.5 today - CT showed worsening right lower lobe airspace disease surrounding the known peripheral mass, which may represent pneumonia or progression of malignancy.  Cannot completely rule out possibility of aspiration pneumonia. - Continue  IV Unasyn  - Monitor CBC - Repeat CXR pending - Follow-up blood culture and sputum culture   HLD (hyperlipidemia) -Crestor   Constipation - Miralax   Acute urinary retention - Patient has been requiring intermittent straight cath for 2 days - Place Foley's catheter 12/11   Subjective: Patient was examined at the bedside, wife present. Appears to be confused and having hallucination, restless CXR due to  worsening leukocytosis Plan for discharge on hospice, wife states unable to take care of him at home   Physical Exam: Vitals:   08/18/24 0452 08/18/24 0827 08/18/24 1349 08/18/24 1543  BP:  (!) 123/52 (!) 127/58 130/80  Pulse:  94 89 61  Resp:  18 20   Temp:  98.1 F (36.7 C) 98.1 F (36.7 C) 97.8 F (36.6 C)  TempSrc:      SpO2:  94% 92% 96%  Weight: 64.3 kg     Height:       General exam: awake, alert, no acute distress HEENT: moist mucus membranes, hearing grossly normal  Respiratory system: CTAB but generally diminished, no wheezes, normal respiratory effort. Cardiovascular system: normal S1/S2, RRR,  no pedal edema.   Gastrointestinal system: soft, NT, ND,  +bowel sounds. Central nervous system: exam limited by patient's confusion, not reliably following commands, moves all extremities, grossly non-focal exam Extremities: moves all, no edema, normal tone Skin: dry, intact, normal temperature   Data Reviewed:  Labs and imaging reviewed  Family Communication: Discussed with wife at the bedside  Disposition: Status is: Inpatient Remains inpatient appropriate because: ongoing evaluation, on IV antibiotics,  Planned Discharge Destination: Hospice - venue to be determined    Time spent: 52 minutes  Author: Laree Lock, MD 08/18/2024 5:06 PM  For on call review [email or web address].

## 2024-08-19 ENCOUNTER — Other Ambulatory Visit: Payer: Self-pay | Admitting: Oncology

## 2024-08-19 ENCOUNTER — Encounter: Payer: Self-pay | Admitting: Family Medicine

## 2024-08-19 DIAGNOSIS — G9341 Metabolic encephalopathy: Secondary | ICD-10-CM | POA: Diagnosis not present

## 2024-08-19 DIAGNOSIS — M8450XA Pathological fracture in neoplastic disease, unspecified site, initial encounter for fracture: Secondary | ICD-10-CM

## 2024-08-19 DIAGNOSIS — C7951 Secondary malignant neoplasm of bone: Secondary | ICD-10-CM | POA: Diagnosis not present

## 2024-08-19 DIAGNOSIS — S065XAA Traumatic subdural hemorrhage with loss of consciousness status unknown, initial encounter: Secondary | ICD-10-CM | POA: Diagnosis not present

## 2024-08-19 DIAGNOSIS — C3491 Malignant neoplasm of unspecified part of right bronchus or lung: Secondary | ICD-10-CM | POA: Diagnosis not present

## 2024-08-19 DIAGNOSIS — L8990 Pressure ulcer of unspecified site, unspecified stage: Secondary | ICD-10-CM | POA: Insufficient documentation

## 2024-08-19 LAB — GLUCOSE, CAPILLARY
Glucose-Capillary: 78 mg/dL (ref 70–99)
Glucose-Capillary: 79 mg/dL (ref 70–99)

## 2024-08-19 LAB — CBC
HCT: 37 % — ABNORMAL LOW (ref 39.0–52.0)
Hemoglobin: 11.5 g/dL — ABNORMAL LOW (ref 13.0–17.0)
MCH: 28.5 pg (ref 26.0–34.0)
MCHC: 31.1 g/dL (ref 30.0–36.0)
MCV: 91.6 fL (ref 80.0–100.0)
Platelets: 159 K/uL (ref 150–400)
RBC: 4.04 MIL/uL — ABNORMAL LOW (ref 4.22–5.81)
RDW: 18.5 % — ABNORMAL HIGH (ref 11.5–15.5)
WBC: 37.1 K/uL — ABNORMAL HIGH (ref 4.0–10.5)
nRBC: 0 % (ref 0.0–0.2)

## 2024-08-19 LAB — BASIC METABOLIC PANEL WITH GFR
Anion gap: 14 (ref 5–15)
BUN: 13 mg/dL (ref 8–23)
CO2: 27 mmol/L (ref 22–32)
Calcium: 8.8 mg/dL — ABNORMAL LOW (ref 8.9–10.3)
Chloride: 106 mmol/L (ref 98–111)
Creatinine, Ser: 0.57 mg/dL — ABNORMAL LOW (ref 0.61–1.24)
GFR, Estimated: >60 mL/min (ref >60)
Glucose, Bld: 74 mg/dL (ref 70–99)
Potassium: 3.6 mmol/L (ref 3.5–5.1)
Sodium: 148 mmol/L — ABNORMAL HIGH (ref 135–145)

## 2024-08-19 MED ORDER — GLYCOPYRROLATE 0.2 MG/ML IJ SOLN
0.2000 mg | INTRAMUSCULAR | Status: DC | PRN
  Administered 2024-08-19 – 2024-08-21 (×4): 0.2 mg via INTRAVENOUS
  Filled 2024-08-19 (×5): qty 1

## 2024-08-19 MED ORDER — SODIUM CHLORIDE 0.9 % IV SOLN: INTRAVENOUS | Status: DC

## 2024-08-19 MED ORDER — ACETAMINOPHEN 325 MG PO TABS: 650.0000 mg | ORAL_TABLET | Freq: Four times a day (QID) | ORAL | Status: DC | PRN

## 2024-08-19 MED ORDER — MORPHINE SULFATE (PF) 2 MG/ML IV SOLN
2.0000 mg | INTRAVENOUS | Status: DC | PRN
  Administered 2024-08-19: 2 mg via INTRAVENOUS
  Administered 2024-08-19: 4 mg via INTRAVENOUS
  Administered 2024-08-19 (×2): 2 mg via INTRAVENOUS
  Administered 2024-08-19: 4 mg via INTRAVENOUS
  Administered 2024-08-20 (×2): 2 mg via INTRAVENOUS
  Administered 2024-08-20 (×3): 4 mg via INTRAVENOUS
  Administered 2024-08-20 (×3): 2 mg via INTRAVENOUS
  Administered 2024-08-21 (×6): 4 mg via INTRAVENOUS
  Administered 2024-08-21: 2 mg via INTRAVENOUS
  Filled 2024-08-19: qty 1
  Filled 2024-08-19 (×6): qty 2
  Filled 2024-08-19 (×2): qty 1
  Filled 2024-08-19: qty 2
  Filled 2024-08-19 (×4): qty 1
  Filled 2024-08-19 (×4): qty 2
  Filled 2024-08-19: qty 1
  Filled 2024-08-19 (×2): qty 2

## 2024-08-19 MED ORDER — ACETAMINOPHEN 650 MG RE SUPP
650.0000 mg | Freq: Four times a day (QID) | RECTAL | Status: DC | PRN
  Administered 2024-08-20 – 2024-08-21 (×3): 650 mg via RECTAL
  Filled 2024-08-19 (×4): qty 1

## 2024-08-19 MED ORDER — GLYCOPYRROLATE 0.2 MG/ML IJ SOLN: 0.2000 mg | INTRAMUSCULAR | Status: DC | PRN

## 2024-08-19 MED ORDER — LORAZEPAM 2 MG/ML IJ SOLN
0.5000 mg | INTRAMUSCULAR | Status: DC | PRN
  Administered 2024-08-19: 0.5 mg via INTRAVENOUS
  Filled 2024-08-19: qty 1

## 2024-08-19 MED ORDER — LORAZEPAM 2 MG/ML IJ SOLN
2.0000 mg | INTRAMUSCULAR | Status: DC | PRN
  Administered 2024-08-19 – 2024-08-20 (×4): 2 mg via INTRAVENOUS
  Filled 2024-08-19 (×4): qty 1

## 2024-08-19 MED ORDER — GLYCOPYRROLATE 1 MG PO TABS: 1.0000 mg | ORAL_TABLET | ORAL | Status: DC | PRN

## 2024-08-19 MED ORDER — POLYVINYL ALCOHOL 1.4 % OP SOLN: 1.0000 [drp] | Freq: Four times a day (QID) | OPHTHALMIC | Status: DC | PRN

## 2024-08-19 NOTE — Progress Notes (Signed)
°   08/18/24 2057  Assess: MEWS Score  Temp 99.9 F (37.7 C)  BP (!) 142/64  MAP (mmHg) 87  Pulse Rate (!) 120  Resp (!) 22  SpO2 92 %  O2 Device Nasal Cannula  O2 Flow Rate (L/min) 4 L/min  Assess: MEWS Score  MEWS Temp 0  MEWS Systolic 0  MEWS Pulse 2  MEWS RR 1  MEWS LOC 0  MEWS Score 3  MEWS Score Color Yellow  Assess: if the MEWS score is Yellow or Red  Were vital signs accurate and taken at a resting state? Yes  Does the patient meet 2 or more of the SIRS criteria? Yes  MEWS guidelines implemented  Yes, yellow  Treat  MEWS Interventions Considered administering scheduled or prn medications/treatments as ordered  Take Vital Signs  Increase Vital Sign Frequency  Yellow: Q2hr x1, continue Q4hrs until patient remains green for 12hrs  Escalate  MEWS: Escalate Yellow: Discuss with charge nurse and consider notifying provider and/or RRT  Notify: Charge Nurse/RN  Name of Charge Nurse/RN Notified Charmaine SAUNDERS, RN  Provider Notification  Provider Name/Title Mansy MD  Date Provider Notified 08/18/24  Time Provider Notified 2032  Method of Notification Page  Notification Reason Other (Comment) (Yellow mews; HR 120-140, sat 88%)  Provider response See new orders  Date of Provider Response 08/18/24  Time of Provider Response 2019 (2044 and 2107)  Assess: SIRS CRITERIA  SIRS Temperature  0  SIRS Respirations  1  SIRS Pulse 1  SIRS WBC 0  SIRS Score Sum  2

## 2024-08-19 NOTE — Progress Notes (Addendum)
 [facility]  - [place] at [facility]   PATIENT NAME: Barry Small    MR#:  [id number]  DATE OF BIRTH:  06-14-1934  SUBJECTIVE:  met with patient's wife and two daughters at bedside. Patient unable to give any history review system. Found to be in respiratory distress not following any commands. Making grunting sounds and appears to have some air hunger. Discussed overall poor prognosis and patient's wife along with daughter agrees with full comfort care. Patient was seen by hospice liaison. Currently unstable and will hold off any transfers to IPU    VITALS:  Blood pressure (!) 184/161, pulse (!) 123, temperature 98.7 F (37.1 C), resp. rate 18, height 5' 5 (1.651 m), weight 63 kg, SpO2 (!) 89%.  PHYSICAL EXAMINATION:  limited GENERAL:  89 y.o.-year-old patient with acute distress respiratory distress LUNGS: coarse breath sounds bilaterally, respiratory distress air hunger CARDIOVASCULAR: S1, S2 normal. No murmur mild tachycardia ABDOMEN: unable to assess EXTREMITIES: No  edema b/l.    NEUROLOGIC: patient is awake however does not follow commands  SKIN:  Wound 08/16/24 2251 Pressure Injury Buttocks Left Stage 2 -  Partial thickness loss of dermis presenting as a shallow open injury with a red, pink wound bed without slough. (Active)      LABORATORY PANEL:  CBC Recent Labs  Lab 08/19/24 0440  WBC 37.1*  HGB 11.5*  HCT 37.0*  PLT 159    Chemistries  Recent Labs  Lab 08/15/24 1542 08/16/24 0424 08/18/24 0511 08/19/24 0440  NA 132*   < > 144 148*  K 3.9   < > 3.4* 3.6  CL 95*   < > 106 106  CO2 24   < > 26 27  GLUCOSE 113*   < > 81 74  BUN 14   < > 13 13  CREATININE 0.56*   < > 0.49* 0.57*  CALCIUM  9.3   < > 8.4* 8.8*  MG 1.9   < > 2.0  --   AST 67*  --   --   --   ALT 27  --   --   --   ALKPHOS 322*  --   --   --   BILITOT 0.8  --   --   --    < > = values in this interval not displayed.   Cardiac Enzymes No results for input(s):  TROPONINI in the last 168 hours. RADIOLOGY:  DG Chest Port 1 View Result Date: 08/18/2024 CLINICAL DATA:  Pneumonia altered EXAM: PORTABLE CHEST 1 VIEW COMPARISON:  07/18/2024, CT 08/15/2024 FINDINGS: Sternotomy. Stable cardiomediastinal silhouette aortic atherosclerosis. Possible small right pleural effusion. Slightly convex peripheral right lung base opacity increased compared to radiograph from November, corresponding to peripheral consolidation and history of mass on prior CT. Adjacent right basilar consolidations. Probable scarring and medial left base. Ovoid masslike opacity also in the right mid lung/perihilar region presumably corresponds to fluid within the pulmonary fissure on recent chest CT imaging. Known skeletal metastatic disease is better seen on CT. Calcified pleural plaques. IMPRESSION: 1. Slightly convex peripheral right lung base opacity increased compared to radiograph from November, corresponding to peripheral consolidation and history of mass on recent CT. Adjacent right basilar consolidations could reflect pneumonia. 2. Ovoid masslike opacity in the right mid lung/perihilar region presumably corresponds to fluid within the pulmonary fissure on recent chest CT imaging. 3. Borderline cardiomegaly with central congestion and possible mild interstitial edema. Possible small right effusion Electronically Signed   By: Luke  Scott M.D.   On: 08/18/2024 18:18    Assessment and Plan Per H and p Barry Small is a 89 y.o. 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 The plan is to place [facility] for chemotherapy and also plan to start radiation  therapy soon. Per his wife, the procedure for port placement was canceled today since patient was found to be confused earlier today. Patient's wife states that pt got up today and had another fall in the bathroom.  States that since having the fall has been more confused than normal.   Extensive imaging was obtained including CT head showing stable right frontal subdural hematoma  and no acute infarct or hemorrhage.  CT scans of C-spine, T-spine, L-spine were non-acute but showed diffuse bony metastatic disease in all cervical and thoracic vertebral bodies and chronic stable C7 compression fracture, progression of lucent lesions in lower C and T spine most extensive at T9.  L1 compression and lucency consistent with pathologic fracture with additional subtle lucent lesions throughout the lumbar vertebrae.  Soft tissue posterior to the L5 vertebral body, indeterminate between extruded L5-S1 disc fragment and epidural metastatic disease.   CT abdomen/pelvis showed large destructive right iliac bone mass with adjacent soft tissue component, lytic bone lesions through lumbar spine and left iliac crest, two hepatic lesions.  CTA chest showed no PE, but worsening RLLL airspace disease surrounding known peripheral mass (PNA versus progression of malignancy).   Acute metabolic encephalopathy: Etiology is not clear.  Likely multifactorial etiology, including metastasized cancer, pain, hyponatremia, SDH and Norco use.    Squamous cell carcinoma lung, right and lung cancer metastatic to bone_ to spin and liver and iliac bone: -consulted Dr. Rennie of oncology - Palliative consulted - Hospice planned at discharge given patient's recent rapid decline - soft C-collar (pt has stable chronic pathologic compression fracture at C7). -- Overall poor prognosis   Fall at home, initial encounter -Fall precaution   Chronic combined systolic and diastolic CHF (congestive heart failure) (HCC): 2D echo 05/19/2024 showed  EF of 45-50% with grade 2 diastolic dysfunction.  P   Hyponatremia: resolved   Hypokalemia   HTN (hypertension)   SDH (subdural hematoma) (HCC): CT of head showed chronic appearing right frontal subdural hematoma measuring up to 7 mm, previously having measured 5-6 mm. Stable mild effacement of the right lateral ventricle without midline shift.  CAD (coronary artery disease): S/p of CABG.   Leukocytosis: Patient has chronic leukocytosis.     HLD (hyperlipidemia)  Patient overall has poor prognosis  Patient was seen by oncology palliative care Josh Borders who had discussion with patient's wife and daughter and option of hospice was discussed. Overall patient seems to be declining and I had conversation with patient's wife and two daughters in the room and they are in agreement with full comfort care/hospice. Patient was seen by hospice liaison. For now patient is unstable will hold off any transfer and continue comfort care measures. Patient is DNR DNI. This was related to oncology palliative care nurse practitioner [facility] and is agrees with plan  Addendum: went ot check on pt--breathing rapidly-will give morphine  prn. Discussed about morphine  gtt if pt cont to  have air hunger and tachypnea.  Transfer to 1c  Family communication : wife and daughters at bedside Consults : oncology, palliative care CODE STATUS: DNR DNI DVT Prophylaxis : comfort care Level of care: Progressive Status is: Inpatient Remains inpatient appropriate because: now on comfort care. At present unstable for discharge to [facility]    TOTAL TIME TAKING CARE OF THIS PATIENT: 40 minutes.  >50% time spent on counselling and coordination of care  Note: This dictation was prepared with Dragon dictation along with smaller phrase technology. Any transcriptional errors that result from this process are unintentional.  Leita Blanch M.D    [facility]   CC: Primary care physician; [name], MD

## 2024-08-19 NOTE — Plan of Care (Signed)
°  Problem: Education: Goal: Knowledge of General Education information will improve Description: Including pain rating scale, medication(s)/side effects and non-pharmacologic comfort measures Outcome: Progressing   Problem: Clinical Measurements: Goal: Respiratory complications will improve Outcome: Progressing   Problem: Elimination: Goal: Will not experience complications related to urinary retention Outcome: Progressing   Problem: Pain Managment: Goal: General experience of comfort will improve and/or be controlled Outcome: Progressing   Problem: Safety: Goal: Ability to remain free from injury will improve Outcome: Progressing

## 2024-08-19 NOTE — Progress Notes (Signed)
 SLP Cancellation Note  Patient Details Name: Barry Small MRN: [id number] DOB: May 06, 1934   Cancelled treatment:       Reason Eval/Treat Not Completed: Medical issues which prohibited therapy  Per chart, pt is comfort care.  Jaysie Benthall B. Rubbie, M.S., CCC-SLP, CBIS Speech-Language Pathologist Certified Brain Injury Specialist [facility] [phone] Ascom [phone] Fax [phone]  Claudetta Rubbie 08/19/2024, 10:54 AM

## 2024-08-20 NOTE — Plan of Care (Signed)
°  Problem: Health Behavior/Discharge Planning: Goal: Ability to manage health-related needs will improve 08/20/2024 0749 by Mcneil Eleanor POUR, RN Outcome: Not Progressing 08/20/2024 0722 by Mcneil Eleanor POUR, RN Outcome: Not Progressing   Problem: Clinical Measurements: Goal: Ability to maintain clinical measurements within normal limits will improve 08/20/2024 0749 by Mcneil Eleanor POUR, RN Outcome: Not Progressing 08/20/2024 0722 by Aurthur Wingerter K, RN Outcome: Not Progressing   Problem: Clinical Measurements: Goal: Will remain free from infection 08/20/2024 0749 by Mcneil Eleanor POUR, RN Outcome: Not Progressing 08/20/2024 0722 by Jeanie Mccard K, RN Outcome: Not Progressing

## 2024-08-20 NOTE — Progress Notes (Signed)
 [facility]  - [place] at [facility]   PATIENT NAME: Barry Small    MR#:  [id number]  DATE OF BIRTH:  [birth year]  SUBJECTIVE:  patient's daughter at bedside. Patient appears more calm and at peace. Getting IV PRN morphine .  VITALS:  Blood pressure 106/61, pulse (!) 132, temperature (!) 102.7 F (39.3 C), resp. rate 20, height 5' 5 (1.651 m), weight 63 kg, SpO2 90%.  PHYSICAL EXAMINATION:  limited GENERAL:  89 y.o.-year-old patient with no acute distress  LUNGS: coarse breath sounds bilaterally CARDIOVASCULAR: S1, S2 normal. No murmur mild tachycardia ABDOMEN: unable to assess  NEUROLOGIC: patient is lethargic SKIN:  Wound 08/16/24 2251 Pressure Injury Buttocks Left Stage 2 -  Partial thickness loss of dermis presenting as a shallow open injury with a red, pink wound bed without slough. (Active)   LABORATORY PANEL:  CBC Recent Labs  Lab 08/19/24 0440  WBC 37.1*  HGB 11.5*  HCT 37.0*  PLT 159    Chemistries  Recent Labs  Lab 08/15/24 1542 08/16/24 0424 08/18/24 0511 08/19/24 0440  NA 132*   < > 144 148*  K 3.9   < > 3.4* 3.6  CL 95*   < > 106 106  CO2 24   < > 26 27  GLUCOSE 113*   < > 81 74  BUN 14   < > 13 13  CREATININE 0.56*   < > 0.49* 0.57*  CALCIUM  9.3   < > 8.4* 8.8*  MG 1.9   < > 2.0  --   AST 67*  --   --   --   ALT 27  --   --   --   ALKPHOS 322*  --   --   --   BILITOT 0.8  --   --   --    < > = values in this interval not displayed.     Assessment and Plan Per H and p Barry Small is a 89 y.o. 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 The plan is to place [facility] for  chemotherapy and also plan to start radiation therapy soon. Per his wife, the procedure for port placement was canceled today since patient was found to be confused earlier today. Patient's wife states that pt got up today and had another fall in the bathroom.  States that since having the fall has been more confused than normal.   Extensive imaging was obtained including CT head showing stable right frontal subdural hematoma  and no acute infarct or hemorrhage.  CT scans of C-spine, T-spine, L-spine were non-acute but showed diffuse bony metastatic disease in all cervical and thoracic vertebral bodies and chronic stable C7 compression fracture, progression of lucent lesions in lower C and T spine most extensive at T9.  L1 compression and lucency consistent with pathologic fracture with additional subtle lucent lesions throughout the lumbar vertebrae.  Soft tissue posterior to the L5 vertebral body, indeterminate between extruded L5-S1 disc fragment and epidural metastatic disease.   CT abdomen/pelvis showed large destructive right iliac bone mass with adjacent soft tissue component, lytic bone lesions through lumbar spine and left iliac crest, two hepatic lesions.  CTA chest showed no PE, but worsening RLLL airspace disease surrounding known peripheral mass (PNA versus progression of malignancy).   Acute metabolic encephalopathy: Etiology is not clear.  Likely multifactorial etiology, including metastasized cancer, pain, hyponatremia, SDH and Norco use.    Squamous cell carcinoma lung, right and lung cancer metastatic to bone_ to spin and liver and iliac bone: -Dr Jacobo aware (chat msg) - Palliative consulted - Hospice planned at discharge given patient's recent rapid decline - soft C-collar (pt has stable chronic pathologic compression fracture at C7). -- Overall poor prognosis   Fall at home, initial encounter -Fall precaution   Chronic combined systolic and diastolic CHF (congestive heart  failure) (HCC): 2D echo 05/19/2024 showed EF of 45-50% with grade 2 diastolic dysfunction.  P   Hyponatremia: resolved   Hypokalemia   HTN (hypertension)   SDH (subdural hematoma) (HCC): CT of head showed chronic appearing right frontal subdural hematoma measuring up to 7 mm, previously having measured 5-6 mm. Stable mild effacement of the right lateral ventricle without midline shift.  CAD (coronary artery disease): S/p of CABG.   Leukocytosis: Patient has chronic leukocytosis.     HLD (hyperlipidemia)  Patient overall has poor prognosis. Cont comfort care Hospice Liasion to follow   Family communication : wife and daughters at bedside Consults : oncology, palliative care CODE STATUS: DNR DNI DVT Prophylaxis : comfort care Level of care: Med-Surg Status is: Inpatient Remains inpatient appropriate because: now on comfort care.    TOTAL TIME TAKING CARE OF THIS PATIENT: 40 minutes.  >50% time spent on counselling and coordination of care  Note: This dictation was prepared with Dragon dictation along with smaller phrase technology. Any transcriptional errors that result from this process are unintentional.  Leita Blanch M.D    [facility]   CC: Primary care physician; Barry Arlyss RAMAN, MD

## 2024-08-20 NOTE — Plan of Care (Signed)
  Problem: Education: Goal: Knowledge of General Education information will improve Description: Including pain rating scale, medication(s)/side effects and non-pharmacologic comfort measures Outcome: Not Progressing   Problem: Health Behavior/Discharge Planning: Goal: Ability to manage health-related needs will improve Outcome: Not Progressing   Problem: Clinical Measurements: Goal: Ability to maintain clinical measurements within normal limits will improve Outcome: Not Progressing   Problem: Clinical Measurements: Goal: Will remain free from infection Outcome: Not Progressing

## 2024-08-21 LAB — CULTURE, BLOOD (ROUTINE X 2)
Culture: NO GROWTH
Culture: NO GROWTH
Special Requests: ADEQUATE
Special Requests: ADEQUATE — AB

## 2024-08-21 MED ORDER — LORAZEPAM 2 MG/ML IJ SOLN: 2.0000 mg | INTRAMUSCULAR | 0 refills | Status: DC | PRN

## 2024-08-21 MED ORDER — POLYVINYL ALCOHOL 1.4 % OP SOLN: 1.0000 [drp] | Freq: Four times a day (QID) | OPHTHALMIC | 0 refills | Status: DC | PRN

## 2024-08-21 MED ORDER — GLYCOPYRROLATE 0.2 MG/ML IJ SOLN: 0.2000 mg | INTRAMUSCULAR | 0 refills | Status: DC | PRN

## 2024-08-21 MED ORDER — MORPHINE SULFATE (PF) 2 MG/ML IV SOLN: 2.0000 mg | INTRAVENOUS | 0 refills | Status: DC | PRN

## 2024-08-22 ENCOUNTER — Encounter: Payer: Self-pay | Admitting: *Deleted

## 2024-08-22 ENCOUNTER — Ambulatory Visit

## 2024-08-22 NOTE — Telephone Encounter (Unsigned)
 Copied from CRM [id number]. Topic: Medical Record Request - Other >> Aug 22, 2024 11:37 AM Mesmerise C wrote: Reason for CRM: Patient's daughter Luke would like access to MyChart to be able to access patient records to get what is needed for diagnosis of cancer advised of medical records department, stated she's rather have access to his chart to print out what she needs and needs the letter of diagnosis emailed to her as well due to needing it today can be reached at [phone]

## 2024-08-22 NOTE — Telephone Encounter (Signed)
 Letter printed.  Please pull for me to sign.  Please send this to patient's family.  Patient has passed and I am going to call his family.  Please allow me to call them first.    I was always glad to see this kind patient in clinic.  Thanks.

## 2024-08-23 ENCOUNTER — Ambulatory Visit

## 2024-08-23 ENCOUNTER — Encounter: Payer: Self-pay | Admitting: Oncology

## 2024-08-23 NOTE — Telephone Encounter (Signed)
 Copied from CRM [id number]. Topic: General - Call Back - No Documentation >> Aug 23, 2024 12:19 PM Ashley R wrote: Reason for CRM: Patient daughter calling to express frustration over handling of access and callback promised yesterday. Would like to be contacted as soon as possible [phone]

## 2024-08-24 ENCOUNTER — Ambulatory Visit

## 2024-08-24 NOTE — Telephone Encounter (Signed)
 Addendum. Called his daughter.  Thanked her for taking the call.  I was always glad to see this gentleman.  Gave my condolences.  She thanked me for the call.    At this point, she has mychart access. Please see how long that is going to be allowed.  Thanks.

## 2024-08-24 NOTE — Telephone Encounter (Signed)
 Please have IT make arrangements to allow access via mychart.  Thanks.   I will call his daughter.  Thanks.

## 2024-08-25 ENCOUNTER — Ambulatory Visit

## 2024-08-25 NOTE — Telephone Encounter (Signed)
 Spoke to [employer], oceanographer to [email or web address]

## 2024-08-26 ENCOUNTER — Ambulatory Visit

## 2024-08-29 ENCOUNTER — Ambulatory Visit

## 2024-08-30 ENCOUNTER — Ambulatory Visit

## 2024-09-05 ENCOUNTER — Inpatient Hospital Stay

## 2024-09-05 ENCOUNTER — Ambulatory Visit

## 2024-09-05 ENCOUNTER — Inpatient Hospital Stay: Admitting: Oncology

## 2024-09-06 ENCOUNTER — Ambulatory Visit

## 2024-09-07 ENCOUNTER — Ambulatory Visit

## 2024-09-08 NOTE — Progress Notes (Signed)
 [facility]) HOSPITAL LIAISON NOTE  Received request from [name] for re-evaluation of hospice [facility]).  Barry Small, TOC, involved and given approval to proceed.  Spoke with patient's wife and 2 daughters at the bedside to initiate education related hospice philosophy, services, team approach to care and criteria for IPU.  Patient/family verbalized understanding of information given. Per discussion, the plan is to proceed with IPU referral to see if approved.  Patient has been expected to have a hospital death.  Discussion with family, they are aware that he could pass in transport and verbalize understanding of this information and would still like to proceed.  They stated patient has never wanted to die in a hospital.    Patient has been approved by [name] for GIP level of care. Bed offer made and accepted.  [facility] RN to call report to the Hospice Home at [phone]  This RN will arrange EMS transportation when the hospital team is ready for discharge.  Please medicate the patient prior to EMS transport as needed for comfort during transport.  Please send signed and completed DNR home with patient/family if applicable.    Above information shared with Barry Small, CM and hospital medical care team.  Please call with any hospice related questions or concerns.  Thank you for the opportunity to participate in this patients care.  Barry HILARIO Na, MA, BSN, RN, FNE Nurse Liaison [phone]

## 2024-09-08 NOTE — Plan of Care (Signed)
  Problem: Pain Managment: Goal: General experience of comfort will improve and/or be controlled Outcome: Progressing

## 2024-09-08 NOTE — Progress Notes (Addendum)
 [facility]  -  at [facility]   PATIENT NAME: Barry Small    MR#:  [id number]  DATE OF BIRTH:  08/20/1934  SUBJECTIVE:  patient's daughters at bedside.  Getting IV PRN morphine . Cont comofrt care Discussed with family about morphine  gtt if needed  VITALS:  Blood pressure 102/77, pulse (!) 118, temperature (!) 101 F (38.3 C), temperature source Oral, resp. rate (!) 22, height 5' 5 (1.651 m), weight 63 kg, SpO2 94%.  PHYSICAL EXAMINATION:  limited GENERAL:  89 y.o.-year-old patient with no acute distress  NEUROLOGIC: patient is lethargic SKIN:  Wound 08/16/24 2251 Pressure Injury Buttocks Left Stage 2 -  Partial thickness loss of dermis presenting as a shallow open injury with a red, pink wound bed without slough. (Active)   LABORATORY PANEL:  CBC Recent Labs  Lab 08/19/24 0440  WBC 37.1*  HGB 11.5*  HCT 37.0*  PLT 159    Chemistries  Recent Labs  Lab 08/15/24 1542 08/16/24 0424 08/18/24 0511 08/19/24 0440  NA 132*   < > 144 148*  K 3.9   < > 3.4* 3.6  CL 95*   < > 106 106  CO2 24   < > 26 27  GLUCOSE 113*   < > 81 74  BUN 14   < > 13 13  CREATININE 0.56*   < > 0.49* 0.57*  CALCIUM  9.3   < > 8.4* 8.8*  MG 1.9   < > 2.0  --   AST 67*  --   --   --   ALT 27  --   --   --   ALKPHOS 322*  --   --   --   BILITOT 0.8  --   --   --    < > = values in this interval not displayed.     Assessment and Plan Per H and p Barry Small is a 89 y.o. 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 The plan is to place [facility] for chemotherapy and also plan to start radiation therapy soon. Per his wife,  the procedure for port placement was canceled today since patient was found to be confused earlier today. Patient's wife states that pt got up today and had another fall in the bathroom.  States that since having the fall has been more confused than normal.   Extensive imaging was obtained including CT head showing stable right frontal subdural hematoma  and no acute infarct or hemorrhage.  CT scans of C-spine, T-spine, L-spine were non-acute but showed diffuse bony metastatic disease in all cervical and thoracic vertebral bodies and chronic stable C7 compression fracture, progression of lucent lesions in lower C and T spine most extensive at T9.  L1 compression and lucency consistent with pathologic fracture with additional subtle lucent lesions throughout the lumbar vertebrae.  Soft tissue posterior to the L5 vertebral body, indeterminate between extruded L5-S1 disc fragment and epidural metastatic disease.   CT abdomen/pelvis showed large destructive right iliac bone mass with adjacent soft tissue component, lytic bone lesions through lumbar spine and left iliac crest, two hepatic lesions.  CTA chest showed no PE, but worsening RLLL  airspace disease surrounding known peripheral mass (PNA versus progression of malignancy).   Acute metabolic encephalopathy: Etiology is not clear.  Likely multifactorial etiology, including metastasized cancer, pain, hyponatremia, SDH and Norco use.    Squamous cell carcinoma lung, right and lung cancer metastatic to bone_ to spin and liver and iliac bone: -Dr Jacobo aware (chat msg) - Palliative consulted - Hospice planned at discharge given patient's recent rapid decline - soft C-collar (pt has stable chronic pathologic compression fracture at C7). -- Overall poor prognosis   Fall at home, initial encounter -Fall precaution   Chronic combined systolic and diastolic CHF (congestive heart failure) (HCC): 2D echo 05/19/2024 showed EF of 45-50% with grade 2 diastolic  dysfunction.  P   Hyponatremia: resolved   Hypokalemia   HTN (hypertension)   SDH (subdural hematoma) (HCC): CT of head showed chronic appearing right frontal subdural hematoma measuring up to 7 mm, previously having measured 5-6 mm. Stable mild effacement of the right lateral ventricle without midline shift.  CAD (coronary artery disease): S/p of CABG.   Leukocytosis: Patient has chronic leukocytosis.     HLD (hyperlipidemia)  Patient overall has poor prognosis. Cont comfort care Hospice Liasion to follow   Family communication : wife and daughters at bedside Consults : oncology, palliative care CODE STATUS: DNR DNI DVT Prophylaxis : comfort care Level of care: Med-Surg Status is: Inpatient Remains inpatient appropriate because: now on comfort care.    TOTAL TIME TAKING CARE OF THIS PATIENT: .  >50% time spent on counselling and coordination of care  Note: This dictation was prepared with Dragon dictation along with smaller phrase technology. Any transcriptional errors that result from this process are unintentional.  Leita Blanch M.D    [facility]   CC: Primary care physician; [name], MD

## 2024-09-08 NOTE — TOC Progression Note (Signed)
 Transition of Care [facility]) - Progression Note    Patient Details  Name: Barry Small MRN: [id number] Date of Birth: 11-29-1933  Transition of Care [facility]) CM/SW Contact  Lorraine LILLETTE Fenton, LCSW Phone Number: 2024-08-22, 11:03 AM  Clinical Narrative:    CSW informed family/pt aggreable to home hospice and choose [facility].  Information shared with [facility] that family choice is HH.  [facility] to follow, no current ICM needs at this time.    Expected Discharge Plan: Home w Hospice Care Barriers to Discharge: Continued Medical Work up               Expected Discharge Plan and Services   Discharge Planning Services: CM Consult   Living arrangements for the past 2 months: Single Family Home                                       Social Drivers of Health (SDOH) Interventions SDOH Screenings   Food Insecurity: Patient Unable To Answer (08/16/2024)  Housing: Unknown (08/16/2024)  Transportation Needs: Patient Unable To Answer (08/16/2024)  Utilities: Patient Unable To Answer (08/16/2024)  Alcohol  Screen: Low Risk (06/29/2024)  Depression (PHQ2-9): Low Risk (08/10/2024)  Recent Concern: Depression (PHQ2-9) - High Risk (07/18/2024)  Financial Resource Strain: Low Risk (06/29/2024)  Physical Activity: Inactive (06/29/2024)  Social Connections: Patient Unable To Answer (08/16/2024)  Stress: Stress Concern Present (06/29/2024)  Tobacco Use: Low Risk (08/15/2024)  Health Literacy: Adequate Health Literacy (03/17/2024)    Readmission Risk Interventions    08/16/2024    2:54 PM  Readmission Risk Prevention Plan  Transportation Screening Complete  PCP or Specialist Appt within 3-5 Days Complete  HRI or Home Care Consult Complete  Social Work Consult for Recovery Care Planning/Counseling Not Complete  SW consult not completed comments no counseling  Palliative Care Screening Complete  Medication Review Oceanographer) Complete

## 2024-09-08 NOTE — Discharge Summary (Signed)
 Physician Discharge Summary   Patient: Barry Small MRN: [id number] DOB: [birth year]  Admit date:     08/15/2024  Discharge date: 2024/09/07  Discharge Physician: Leita Blanch   PCP: [name], MD     Discharge Diagnoses: Principal Problem:   Acute metabolic encephalopathy Active Problems:   Squamous cell carcinoma lung, right (HCC)   Fall at home, initial encounter   Pathological fracture due to malignant neoplasm metastatic to bone (HCC)   Chronic combined systolic and diastolic CHF (congestive heart failure) (HCC)   Hyponatremia   HTN (hypertension)   SDH (subdural hematoma) (HCC)   CAD (coronary artery disease)   Leukocytosis   HLD (hyperlipidemia)   Palliative care encounter   Pressure injury of skin   Barry Small is a 89 y.o. 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 The plan is to place [facility] for chemotherapy and also plan to start radiation therapy soon. Per his wife, the procedure for port placement was canceled today since patient was found to be confused earlier today. Patient's wife states that pt got up today and had another fall in the bathroom.  States that since having the fall has been more confused than normal.    Extensive imaging was obtained including CT head showing stable right frontal subdural hematoma  and no acute infarct or hemorrhage.  CT scans of C-spine, T-spine, L-spine were non-acute but showed diffuse bony metastatic disease in all cervical and thoracic vertebral bodies and chronic stable C7 compression fracture, progression of lucent lesions in lower C and T spine most extensive at T9.  L1  compression and lucency consistent with pathologic fracture with additional subtle lucent lesions throughout the lumbar vertebrae.  Soft tissue posterior to the L5 vertebral body, indeterminate between extruded L5-S1 disc fragment and epidural metastatic disease.   CT abdomen/pelvis showed large destructive right iliac bone mass with adjacent soft tissue component, lytic bone lesions through lumbar spine and left iliac crest, two hepatic lesions.  CTA chest showed no PE, but worsening RLLL airspace disease surrounding known peripheral mass (PNA versus progression of malignancy).    Acute metabolic encephalopathy: Etiology is not clear.  Likely multifactorial etiology, including metastasized cancer, pain, hyponatremia, SDH and Norco use.    Squamous cell carcinoma lung, right and lung cancer metastatic to bone_ to spin and liver and iliac bone: -Dr Jacobo aware (chat msg) - Palliative consulted - Hospice planned at discharge given patient's recent rapid decline -- Overall poor prognosis   Fall at home, initial encounter -Fall precaution   Chronic combined systolic and diastolic CHF (congestive heart failure) (HCC): 2D echo 05/19/2024 showed EF of 45-50% with grade 2 diastolic dysfunction.  P    SDH (subdural hematoma) (HCC): CT of head showed chronic appearing right frontal subdural hematoma measuring up to 7 mm, previously having measured 5-6 mm. Stable mild effacement of the right lateral ventricle without midline shift.   CAD (coronary artery disease): S/p of CABG.   Leukocytosis: Patient has chronic leukocytosis.     HLD (hyperlipidemia)   Patient overall has poor prognosis. Cont comfort care [facility] to follow--pt has been approved for [facility] and will transfer today  Family communication : daughters at bedside Consults : oncology, palliative care CODE STATUS: DNR DNI DVT Prophylaxis : comfort care    DISCHARGE MEDICATION: Allergies as of 31-Aug-2024       Reactions    Atorvastatin Other (See Comments)   REACTION: myalgia   Morphine  Other (See Comments)   REACTION: syncope- lower BP but may be able to tolerate low dose of med.    Simvastatin Other (See Comments)   REACTION: ? reaction        Medication List     STOP taking these medications    acetaminophen  500 MG tablet Commonly known as: TYLENOL    aspirin  EC 81 MG tablet   bisacodyl  10 MG suppository Commonly known as: DULCOLAX   HYDROcodone -acetaminophen  5-325 MG tablet Commonly known as: NORCO/VICODIN   metoprolol  tartrate 25 MG tablet Commonly known as: LOPRESSOR    multivitamin tablet   nitroGLYCERIN  0.4 MG SL tablet Commonly known as: NITROSTAT    omeprazole  20 MG capsule Commonly known as: PRILOSEC   rosuvastatin  20 MG tablet Commonly known as: CRESTOR    SIMETHICONE PO   sodium chloride  1 g tablet   VITAMIN B-12 PO   Vitamin D3 50 MCG (2000 UT) Tabs       TAKE these medications    artificial tears ophthalmic solution Place 1 drop into both eyes 4 (four) times daily as needed for dry eyes.   glycopyrrolate  0.2 MG/ML injection Commonly known as: ROBINUL  Inject 1 mL (0.2 mg total) into the vein every 4 (four) hours as needed (excessive secretions).   LORazepam  2 MG/ML injection Commonly known as: ATIVAN  Inject 1-2 mLs (2-4 mg total) into the vein every 4 (four) hours as needed for anxiety or seizure.   morphine  (PF) 2 MG/ML injection Inject 1-2 mLs (2-4 mg total) into the vein every 30 (thirty) minutes as needed (severe pain, dyspnea; if not achieving comfort, call MD for infusion orders).         Condition at discharge: poor  The results of significant diagnostics from this hospitalization (including imaging, microbiology, ancillary and laboratory) are listed below for reference.   Imaging Studies: DG Chest Port 1 View Result Date: 08/18/2024 CLINICAL DATA:  Pneumonia altered EXAM: PORTABLE CHEST 1 VIEW COMPARISON:  07/18/2024, CT 08/15/2024  FINDINGS: Sternotomy. Stable cardiomediastinal silhouette aortic atherosclerosis. Possible small right pleural effusion. Slightly convex peripheral right lung base opacity increased compared to radiograph from November, corresponding to peripheral consolidation and history of mass on prior CT. Adjacent right basilar consolidations. Probable scarring and medial left base. Ovoid masslike opacity also in the right mid lung/perihilar region presumably corresponds to fluid within the pulmonary fissure on recent chest CT imaging. Known skeletal metastatic disease is better seen on CT. Calcified pleural plaques. IMPRESSION: 1. Slightly convex peripheral right lung base opacity increased compared to radiograph from November, corresponding to peripheral consolidation and history of mass on recent CT. Adjacent right basilar consolidations could reflect pneumonia. 2. Ovoid masslike opacity in the right mid lung/perihilar region presumably corresponds to fluid within the pulmonary fissure on recent chest CT imaging. 3. Borderline cardiomegaly with central congestion and possible mild interstitial edema. Possible small right effusion Electronically Signed   By: Luke Bun M.D.   On: 08/18/2024 18:18   CT ABDOMEN PELVIS W CONTRAST Result Date: 08/15/2024 EXAM: CT ABDOMEN AND PELVIS WITH CONTRAST 08/15/2024 07:36:45 PM TECHNIQUE: CT of the abdomen and pelvis was performed with the administration of intravenous contrast. Multiplanar reformatted images are provided for review. Automated exposure control, iterative  reconstruction, and/or weight-based adjustment of the mA/kV was utilized to reduce the radiation dose to as low as reasonably achievable. 100 mL of iohexol  (OMNIPAQUE ) 350 MG/ML injection was administered. COMPARISON: 07/15/2024. CLINICAL HISTORY: Abdominal trauma, blunt. FINDINGS: LOWER CHEST: See chest CT report today. LIVER: Small an ill-defined lucent lesion centrally in the liver measures 9 mm, cannot exclude  metastasis. Previously seen area of hypermetabolic activity in the right hepatic lobe on prior PET not as well visualized. There is a subtle low-density area in the general region measuring 1.5 cm which may correspond to the area of abnormal activity on prior PET CT. GALLBLADDER AND BILE DUCTS: Prior cholecystectomy. No biliary ductal dilatation. SPLEEN: No acute abnormality. PANCREAS: No acute abnormality. ADRENAL GLANDS: No acute abnormality. KIDNEYS, URETERS AND BLADDER: No stones in the kidneys or ureters. No hydronephrosis. No perinephric or periureteral stranding. Small layering high-density material posteriorly in the urinary bladder possibly layering contrast or stones. GI AND BOWEL: Stomach demonstrates no acute abnormality. There is no bowel obstruction. PERITONEUM AND RETROPERITONEUM: No ascites. No free air. VASCULATURE: Aorta is normal in caliber. LYMPH NODES: No lymphadenopathy. REPRODUCTIVE ORGANS: No acute abnormality. BONES AND SOFT TISSUES: Large destructive right iliac bone mass with adjacent soft tissue mass measuring 7.1 x 5.0 cm again noted, similar to prior study. Heterogeneous appearance of the bones with probable lytic metastases throughout the lumbar spine and in the left iliac crest. No focal soft tissue abnormality. IMPRESSION: 1. Large destructive right iliac bone mass with adjacent soft tissue component measuring 7.1 x 5.0 cm, unchanged from prior study, compatible with metastatic disease. 2. Probable lytic osseous metastases throughout the lumbar spine and in the left iliac crest. 3. Two hepatic lesions suspicious for metastases, including a 9 mm ill-defined central hepatic lesion and a subtle 1.5 cm low-density focus in the right hepatic lobe that may correspond to prior PET-avid activity. Electronically signed by: Franky Crease MD 08/15/2024 08:00 PM EST RP Workstation: [id number]   CT L-SPINE NO CHARGE Result Date: 08/15/2024 EXAM: CT OF THE LUMBAR SPINE WITHOUT CONTRAST 08/15/2024  07:36:45 PM TECHNIQUE: CT of the lumbar spine was performed without the administration of intravenous contrast. Multiplanar reformatted images are provided for review. Automated exposure control, iterative reconstruction, and/or weight based adjustment of the mA/kV was utilized to reduce the radiation dose to as low as reasonably achievable. COMPARISON: 07/15/2024. CLINICAL HISTORY: FINDINGS: BONES AND ALIGNMENT: Normal vertebral body heights except for L1. Mild compression fracture involving the L1 vertebral body, similar to the prior study. Heterogeneous appearance of the L1 vertebral body with lucency throughout, likely signifying metastases and pathologic fracture. Other subtle lucent areas throughout the remainder of the lumbar vertebrae, likely metastases although these are less well identified. Normal alignment. DEGENERATIVE CHANGES: Diffuse degenerative disc and facet disease. SOFT TISSUES: Soft tissue posterior to the L5 vertebral body could reflect extrusion L5-S1 disc fragment or epidural metastatic disease. IMPRESSION: 1. Mild compression fracture of the L1 vertebral body with heterogeneous appearance and lucency, likely signifying metastases and pathologic fracture, similar to prior study. 2. Additional subtle lucent lesions throughout the lumbar vertebrae, likely osseous metastases. 3. Soft tissue posterior to the L5 vertebral body, indeterminate between extruded L5-S1 disc fragment and epidural metastatic disease. This could be further evaluated with MRI if felt clinically indicated. Electronically signed by: Franky Crease MD 08/15/2024 07:54 PM EST RP Workstation: [id number]   CT T-SPINE NO CHARGE Result Date: 08/15/2024 EXAM: CT THORACIC SPINE WITHOUT CONTRAST 08/15/2024 07:36:45 PM TECHNIQUE: CT of the thoracic spine was  performed without the administration of intravenous contrast. Multiplanar reformatted images are provided for review. Automated exposure control, iterative reconstruction, and/or  weight based adjustment of the mA/kV was utilized to reduce the radiation dose to as low as reasonably achievable. COMPARISON: 07/15/2024. CLINICAL HISTORY: FINDINGS: BONES AND ALIGNMENT: Lucent lesions are present throughout the thoracic spine, compatible with metastases. This appears progressed since the prior study. Most extensive disease is noted within the T9 vertebral body, particularly the left side of the vertebral body. Normal alignment. DEGENERATIVE CHANGES: No significant degenerative changes. SOFT TISSUES: No acute abnormality. IMPRESSION: 1. Progression of lucent lesions throughout the lower cervical spine and thoracic spine compatible with worsening metastases, most extensive at T9. Electronically signed by: Franky Crease MD 08/15/2024 07:50 PM EST RP Workstation: [id number]   CT Angio Chest Pulmonary Embolism (PE) W or WO Contrast Result Date: 08/15/2024 EXAM: CTA of the Chest with contrast for PE 08/15/2024 07:36:45 PM TECHNIQUE: CTA of the chest was performed after the administration of intravenous contrast. Multiplanar reformatted images are provided for review. MIP images are provided for review. Automated exposure control, iterative reconstruction, and/or weight based adjustment of the mA/kV was utilized to reduce the radiation dose to as low as reasonably achievable. COMPARISON: 07/15/2024, PET CT: 07/22/2024. CLINICAL HISTORY: Pulmonary embolism (PE) suspected, high prob. FINDINGS: PULMONARY ARTERIES: Pulmonary arteries are adequately opacified for evaluation. No pulmonary embolism. Main pulmonary artery is normal in caliber. MEDIASTINUM: Cardiomegaly. Prior CABG. Aortic atherosclerosis. LYMPH NODES: No mediastinal, hilar or axillary lymphadenopathy. LUNGS AND PLEURA: Bilateral calcified pleural plaques. Fluid noted in the major fissure. Worsening airspace disease in the right lower lobe surrounding the previously seen right lower lobe peripheral mass. While a component of this could reflect  worsening/enlarging malignancy, much of this appears to reflect airspace disease, possible pneumonia. No pneumothorax. UPPER ABDOMEN: Limited images of the upper abdomen are unremarkable. SOFT TISSUES AND BONES: Extensive osseous metastatic disease throughout the spine. This is worsening since the prior study, most notable at the T9 level. No acute soft tissue abnormality. IMPRESSION: 1. No evidence of pulmonary embolus. 2. Worsening right lower lobe airspace disease surrounding the known peripheral mass, which may represent pneumonia or progression of malignancy. 3. Extensive osseous metastatic disease throughout the spine, progressed from the prior study, most notable at T9. Electronically signed by: Franky Crease MD 08/15/2024 07:47 PM EST RP Workstation: [id number]   CT CERVICAL SPINE WO CONTRAST Result Date: 08/15/2024 CLINICAL DATA:  Lung cancer, multiple falls, increased confusion EXAM: CT CERVICAL SPINE WITHOUT CONTRAST TECHNIQUE: Multidetector CT imaging of the cervical spine was performed without intravenous contrast. Multiplanar CT image reconstructions were also generated. RADIATION DOSE REDUCTION: This exam was performed according to the departmental dose-optimization program which includes automated exposure control, adjustment of the mA and/or kV according to patient size and/or use of iterative reconstruction technique. COMPARISON:  07/22/2024 FINDINGS: Alignment: Degenerative anterolisthesis of C7 on T1 measuring 4-5 mm. Otherwise alignment is anatomic. Skull base and vertebrae: There are numerous lytic lesions throughout the visualized cervical and thoracic vertebral bodies, with mild chronic wedging of the C7 vertebral body consistent with chronic pathologic fracture. This is unchanged since the recent PET scan. There are no acute displaced fractures identified. Soft tissues and spinal canal: No prevertebral fluid or swelling. No visible canal hematoma. Disc levels: There is severe facet  hypertrophy throughout the cervical spine. Multilevel disc space narrowing greatest at the C5-6, C6-7, and C7-T1 levels. Mild central canal stenosis at C5-6, C6-7, and C7-T1 due to the degenerative changes  and anterolisthesis described above. Upper chest: Airway is patent. Visualized portions of the lung apices are clear. Other: Reconstructed images demonstrate no additional findings. IMPRESSION: 1. No acute cervical spine fracture. 2. Diffuse bony metastases throughout all visualized cervical and thoracic vertebral bodies, with stable chronic pathologic compression fracture at C7. No change since the recent PET scan. Electronically Signed   By: Ozell Daring M.D.   On: 08/15/2024 17:06   CT HEAD WO CONTRAST Result Date: 08/15/2024 CLINICAL DATA:  Lung cancer, fell this morning, increased confusion, history of subdural hematoma on 08/11/2024 exam EXAM: CT HEAD WITHOUT CONTRAST TECHNIQUE: Contiguous axial images were obtained from the base of the skull through the vertex without intravenous contrast. RADIATION DOSE REDUCTION: This exam was performed according to the departmental dose-optimization program which includes automated exposure control, adjustment of the mA and/or kV according to patient size and/or use of iterative reconstruction technique. COMPARISON:  08/11/2024 FINDINGS: Brain: Chronic appearing subdural hematoma along the right frontal convexity, measuring up to 7 mm in maximal thickness. No evidence of acute hemorrhage. Stable mild effacement of the right lateral ventricle without significant midline shift. No evidence of acute infarct. The lateral ventricles and midline structures are otherwise unremarkable. Vascular: No hyperdense vessel or unexpected calcification. Skull: Normal. Negative for fracture or focal lesion. Sinuses/Orbits: No acute finding. Other: None. IMPRESSION: 1. Chronic appearing right frontal subdural hematoma measuring up to 7 mm, previously having measured 5-6 mm. Stable  mild effacement of the right lateral ventricle without midline shift. 2. No acute infarct or hemorrhage. Electronically Signed   By: Ozell Daring M.D.   On: 08/15/2024 17:02   MR Brain W Wo Contrast Result Date: 08/11/2024 EXAM: MRI BRAIN WITH AND WITHOUT CONTRAST 08/11/2024 09:32:31 AM TECHNIQUE: Multiplanar multisequence MRI of the head/brain was performed with and without the administration of intravenous contrast. 7 mL gadobutrol  (GADAVIST ) 1 MMOL/ML injection. COMPARISON: PET CT 07/22/2024. CLINICAL HISTORY: 89 year old male with recently diagnosed lung mass, metastatic disease evaluation for NSCLC, and also reports a recent fall. FINDINGS: BRAIN AND VENTRICLES: Right side subdural hematoma (series 9 image 41 and coronal series 17 image 16), measures 5 to 6 mm maximal thickness over the superior right hemisphere convexity. There is mild associated mass effect on the right lateral ventricle. Trace leftward midline shift (series 9 image 31). Subdural blood products, mild susceptibility artifact on diffusion imaging. No other acute intracranial hemorrhage identified. Normal basilar cisterns. No superimposed acute infarct. No ventriculomegaly. Following contrast, the major dural venous sinuses are enhancing and appear patent. There is reactive right hemisphere pachymeningeal thickening and enhancement (series 19 image 14). No other dural abnormality is identified. No other abnormal intracranial enhancement is identified. Mild for age periventricular and scattered cerebral white matter T2 and FLAIR hyperintensity, most commonly due to chronic small vessel disease. No cortical encephalomalacia identified. Deep gray nuclei, brainstem and cerebellum appear negative. The sella is unremarkable. Normal flow voids. ORBITS: No acute abnormality. SINUSES: No acute abnormality. BONES AND SOFT TISSUES: Normal bone marrow signal and enhancement. Partially visible cervical spine degeneration. No acute soft tissue  abnormality. IMPRESSION: 1. Right subdural hematoma (56 mm) with mild mass effect on the right lateral ventricle and trace leftward midline shift. From the MRI department, I instructed this patient be escorted to the emergency department for further evaluation and management. 2. No metastatic disease identified. No other acute intracranial abnormality. Electronically signed by: Helayne Hurst MD 08/11/2024 10:02 AM EST RP Workstation: [id number]   CT BONE TROCAR/NEEDLE BIOPSY SUPERFICIAL Result Date:  07/27/2024 INDICATION: 89 year old with right lung mass and lytic bone lesions. Tissue diagnosis is needed. EXAM: CT-guided core biopsy of right iliac lytic bone lesion TECHNIQUE: Multidetector CT imaging of the pelvis was performed following the standard protocol without IV contrast. RADIATION DOSE REDUCTION: This exam was performed according to the departmental dose-optimization program which includes automated exposure control, adjustment of the mA and/or kV according to patient size and/or use of iterative reconstruction technique. MEDICATIONS: Moderate sedation ANESTHESIA/SEDATION: Moderate (conscious) sedation was employed during this procedure. A total of Versed  1 mg and Fentanyl  25 mcg was administered intravenously by the radiology nurse. Total intra-service moderate Sedation Time: 10 minutes. The patient's level of consciousness and vital signs were monitored continuously by radiology nursing throughout the procedure under my direct supervision. COMPLICATIONS: None immediate. PROCEDURE: Informed written consent was obtained from the patient after a thorough discussion of the procedural risks, benefits and alternatives. All questions were addressed. A timeout was performed prior to the initiation of the procedure. Patient was placed prone. CT images through the pelvis were obtained. Large lytic bone lesion involving the right iliac wing was identified and targeted. Posterior lower back was prepped with  chlorhexidine  and sterile field was created. Skin was anesthetized using 1% lidocaine . Small incision was made. Using CT guidance, 17 gauge coaxial needle was directed into the lytic bone lesion. Multiple core biopsies were obtained with an 18 gauge core device. Specimens placed in formalin. 17 gauge needle was removed. Follow up CT images were obtained. Bandage placed over the puncture site. FINDINGS: Large destructive soft tissue mass involving the right iliac wing. Biopsy needle confirmed within the soft tissue mass. IMPRESSION: CT-guided core biopsy of the large lytic lesion involving the right iliac bone. Electronically Signed   By: Juliene Balder M.D.   On: 07/27/2024 12:50     Discharge time spent: less than 30 minutes.  Signed: Leita Blanch, MD [facility] Aug 28, 2024

## 2024-09-08 NOTE — Progress Notes (Signed)
 [facility] Follow up Note  Follow up on referral for hospice at home.  Patient has been on full comfort measures with hospital death expected.  Dr. Tobie asked for patient to be evaluated for hospice IPU.  Family is wanting patient to remain at the hospital, however, if patient does not pass- want to look at going to the hospice In[facility]) for end of life care.  This RN will plan on seeing the patient in the morning.  Thank you for allowing participation in this patient's care.  Saddie HILARIO Na, RN Nurse Liaison [phone]

## 2024-09-08 DEATH — deceased

## 2024-09-09 ENCOUNTER — Inpatient Hospital Stay

## 2024-09-27 ENCOUNTER — Inpatient Hospital Stay

## 2024-09-27 ENCOUNTER — Inpatient Hospital Stay: Admitting: Oncology

## 2024-09-29 ENCOUNTER — Inpatient Hospital Stay

## 2024-10-18 ENCOUNTER — Inpatient Hospital Stay

## 2024-10-18 ENCOUNTER — Inpatient Hospital Stay: Admitting: Oncology

## 2024-10-20 ENCOUNTER — Inpatient Hospital Stay

## 2024-11-08 ENCOUNTER — Inpatient Hospital Stay

## 2024-11-08 ENCOUNTER — Inpatient Hospital Stay: Admitting: Oncology

## 2024-11-10 ENCOUNTER — Inpatient Hospital Stay

## 2025-03-20 ENCOUNTER — Ambulatory Visit
